# Patient Record
Sex: Male | Born: 1980 | Race: White | Hispanic: No | Marital: Married | Smoking: Current every day smoker
Health system: Southern US, Community
[De-identification: ages and names within clinical notes are randomized; demographics above are authoritative.]

## PROBLEM LIST (undated history)

## (undated) DIAGNOSIS — F4310 Post-traumatic stress disorder, unspecified: Secondary | ICD-10-CM

## (undated) DIAGNOSIS — K859 Acute pancreatitis without necrosis or infection, unspecified: Secondary | ICD-10-CM

## (undated) HISTORY — PX: OTHER SURGICAL HISTORY: SHX169

## (undated) HISTORY — PX: APPENDECTOMY: SHX54

## (undated) HISTORY — PX: KNEE ARTHROSCOPY: SUR90

## (undated) HISTORY — PX: ABDOMINAL SURGERY: SHX537

---

## 2010-03-17 ENCOUNTER — Emergency Department (HOSPITAL_COMMUNITY): Admission: EM | Admit: 2010-03-17 | Discharge: 2010-03-17 | Payer: Self-pay | Admitting: Emergency Medicine

## 2017-08-11 ENCOUNTER — Other Ambulatory Visit: Payer: Self-pay

## 2017-08-11 ENCOUNTER — Encounter (HOSPITAL_COMMUNITY): Payer: Self-pay

## 2017-08-11 DIAGNOSIS — K0889 Other specified disorders of teeth and supporting structures: Secondary | ICD-10-CM | POA: Diagnosis present

## 2017-08-11 NOTE — ED Triage Notes (Signed)
Pt reports dental pain x 2 days. He has a dentist appointment next week, but reports that the pain is too bad. A&Ox4. Denies fever, bleeding, or trouble swallowing.

## 2017-08-12 ENCOUNTER — Emergency Department (HOSPITAL_COMMUNITY)
Admission: EM | Admit: 2017-08-12 | Discharge: 2017-08-12 | Disposition: A | Discharge status: Home / Self Care | Payer: BLUE CROSS/BLUE SHIELD | Attending: Emergency Medicine | Admitting: Emergency Medicine

## 2017-08-12 DIAGNOSIS — K0889 Other specified disorders of teeth and supporting structures: Secondary | ICD-10-CM

## 2017-08-12 MED ORDER — HYDROCODONE-ACETAMINOPHEN 5-325 MG PO TABS: ORAL_TABLET | ORAL | 0 refills | Status: DC

## 2017-08-12 MED ORDER — OXYCODONE-ACETAMINOPHEN 5-325 MG PO TABS
1.0000 | ORAL_TABLET | ORAL | Status: DC | PRN
  Administered 2017-08-12: 1 via ORAL
  Filled 2017-08-12: qty 1

## 2017-08-12 MED ORDER — HYDROCODONE-ACETAMINOPHEN 5-325 MG PO TABS
1.0000 | ORAL_TABLET | Freq: Once | ORAL | Status: AC
  Administered 2017-08-12: 1 via ORAL
  Filled 2017-08-12: qty 1

## 2017-08-12 MED ORDER — AMOXICILLIN 500 MG PO CAPS: 1000.0000 mg | ORAL_CAPSULE | Freq: Two times a day (BID) | ORAL | 0 refills | Status: DC

## 2017-08-12 MED ORDER — AMOXICILLIN 500 MG PO CAPS
1000.0000 mg | ORAL_CAPSULE | Freq: Once | ORAL | Status: AC
  Administered 2017-08-12: 1000 mg via ORAL
  Filled 2017-08-12: qty 2

## 2017-08-12 NOTE — ED Provider Notes (Signed)
[facility]-EMERGENCY DEPT Provider Note   CSN: [id number] Arrival date & time: 08/11/17  2148     History   Chief Complaint Chief Complaint  Patient presents with  . Dental Pain    HPI   Blood pressure (!) 151/90, pulse 75, temperature 98.5 F (36.9 C), temperature source Oral, resp. rate 16, SpO2 97 %.  Jeffrey Mathis is a 37 y.o. male complaining of severe right lower dental pain onset 2 days ago not alleviated with Orajel or ibuprofen.  He has an appointment with dentist next week.Denies fever/chills, difficulty opening jaw, difficulty swallowing, SOB, gum swelling, facial swelling, neck swelling.    History reviewed. No pertinent past medical history.  There are no active problems to display for this patient.    Home Medications    Prior to Admission medications   Medication Sig Start Date End Date Taking? Authorizing Provider  amoxicillin (AMOXIL) 500 MG capsule Take 2 capsules (1,000 mg total) by mouth 2 (two) times daily. 08/12/17   Velera Lansdale, Joni Reining, PA-C  HYDROcodone-acetaminophen (NORCO/VICODIN) 5-325 MG tablet Take 1-2 tablets by mouth every 6 hours as needed for pain. 08/12/17   Milca Sytsma, Mardella Layman    Family History History reviewed. No pertinent family history.  Social History Social History   Tobacco Use  . Smoking status: Not on file  Substance Use Topics  . Alcohol use: Not on file  . Drug use: Not on file     Allergies   Patient has no known allergies.   Review of Systems Review of Systems  A complete review of systems was obtained and all systems are negative except as noted in the HPI and PMH.    Physical Exam Updated Vital Signs BP (!) 151/90 (BP Location: Left Arm)   Pulse 75   Temp 98.5 F (36.9 C) (Oral)   Resp 16   SpO2 97%   Physical Exam  Constitutional: He is oriented to person, place, and time. He appears well-developed and well-nourished. No distress.  HENT:  Head: Normocephalic.    Mouth/Throat: Oropharynx is clear and moist.  Generally poor dentition, no gingival swelling, erythema or tenderness to palpation. Patient is handling their secretions. There is no tenderness to palpation or firmness underneath tongue bilaterally. No trismus.    Eyes: Conjunctivae and EOM are normal. Pupils are equal, round, and reactive to light.  Neck: Normal range of motion.  Cardiovascular: Normal rate.  Pulmonary/Chest: Effort normal. No stridor.  Abdominal: Soft.  Musculoskeletal: Normal range of motion.  Lymphadenopathy:    He has no cervical adenopathy.  Neurological: He is alert and oriented to person, place, and time.  Psychiatric: He has a normal mood and affect.  Nursing note and vitals reviewed.    ED Treatments / Results  Labs (all labs ordered are listed, but only abnormal results are displayed) Labs Reviewed - No data to display  EKG  EKG Interpretation None       Radiology No results found.  Procedures Procedures (including critical care time)  Medications Ordered in ED Medications  oxyCODONE-acetaminophen (PERCOCET/ROXICET) 5-325 MG per tablet 1 tablet (1 tablet Oral Given 08/12/17 0106)  amoxicillin (AMOXIL) capsule 1,000 mg (not administered)  HYDROcodone-acetaminophen (NORCO/VICODIN) 5-325 MG per tablet 1 tablet (not administered)     Initial Impression / Assessment and Plan / ED Course  I have reviewed the triage vital signs and the nursing notes.  Pertinent labs & imaging results that were available during my care of the patient were reviewed by  me and considered in my medical decision making (see chart for details).     Vitals:   08/11/17 2321  BP: (!) 151/90  Pulse: 75  Resp: 16  Temp: 98.5 F (36.9 C)  TempSrc: Oral  SpO2: 97%    Medications  oxyCODONE-acetaminophen (PERCOCET/ROXICET) 5-325 MG per tablet 1 tablet (1 tablet Oral Given 08/12/17 0106)  amoxicillin (AMOXIL) capsule 1,000 mg (not administered)   HYDROcodone-acetaminophen (NORCO/VICODIN) 5-325 MG per tablet 1 tablet (not administered)    Charlene Brookeatrick Difatta is 37 y.o. male presenting with dental pain associated with dental caries but no signs or symptoms of dental abscess. Patient afebrile, non toxic appearing and swallowing secretions well. I gave patient referral to dentist and stressed the importance of dental follow up for definitive management of dental issues. Patient voices understanding and is agreeable to plan.  Evaluation does not show pathology that would require ongoing emergent intervention or inpatient treatment. Pt is hemodynamically stable and mentating appropriately. Discussed findings and plan with patient/guardian, who agrees with care plan. All questions answered. Return precautions discussed and outpatient follow up given.      Final Clinical Impressions(s) / ED Diagnoses   Final diagnoses:  Pain, dental    ED Discharge Orders        Ordered    amoxicillin (AMOXIL) 500 MG capsule  2 times daily     08/12/17 0129    HYDROcodone-acetaminophen (NORCO/VICODIN) 5-325 MG tablet     08/12/17 0129       Tonetta Napoles, Mardella Laymanicole, PA-C 08/12/17 0131    Molpus, Jonny RuizJohn, MD 08/12/17 [phone]

## 2017-08-12 NOTE — Discharge Instructions (Signed)
Take vicodin for breakthrough pain, do not drink alcohol, drive, care for children or do other critical tasks while taking vicodin. °  °Apply warm compresses to jaw throughout the day.  ° °Take your antibiotics as directed and to the end of the course.  ° °Followup with a dentist is very important for dental pain. Return to emergency department for emergent changing or worsening symptoms including  fever, change in vision, redness to the face that rapidly spreads towards the eye, nausea or vomiting, difficulty swallowing or shortness of breath. ° °

## 2017-12-11 ENCOUNTER — Inpatient Hospital Stay (HOSPITAL_COMMUNITY)
Admission: EM | Admit: 2017-12-11 | Discharge: 2017-12-14 | DRG: 440 | Disposition: A | Discharge status: Home / Self Care | Payer: Non-veteran care | Attending: Internal Medicine | Admitting: Internal Medicine

## 2017-12-11 ENCOUNTER — Emergency Department (HOSPITAL_COMMUNITY): Payer: Non-veteran care

## 2017-12-11 ENCOUNTER — Other Ambulatory Visit: Payer: Self-pay

## 2017-12-11 ENCOUNTER — Encounter (HOSPITAL_COMMUNITY): Payer: Self-pay

## 2017-12-11 DIAGNOSIS — K8689 Other specified diseases of pancreas: Secondary | ICD-10-CM | POA: Diagnosis present

## 2017-12-11 DIAGNOSIS — D72829 Elevated white blood cell count, unspecified: Secondary | ICD-10-CM | POA: Diagnosis not present

## 2017-12-11 DIAGNOSIS — D72828 Other elevated white blood cell count: Secondary | ICD-10-CM | POA: Diagnosis present

## 2017-12-11 DIAGNOSIS — Z79890 Hormone replacement therapy (postmenopausal): Secondary | ICD-10-CM

## 2017-12-11 DIAGNOSIS — R740 Nonspecific elevation of levels of transaminase and lactic acid dehydrogenase [LDH]: Secondary | ICD-10-CM | POA: Diagnosis not present

## 2017-12-11 DIAGNOSIS — K219 Gastro-esophageal reflux disease without esophagitis: Secondary | ICD-10-CM | POA: Diagnosis present

## 2017-12-11 DIAGNOSIS — Z9049 Acquired absence of other specified parts of digestive tract: Secondary | ICD-10-CM

## 2017-12-11 DIAGNOSIS — F4310 Post-traumatic stress disorder, unspecified: Secondary | ICD-10-CM | POA: Diagnosis present

## 2017-12-11 DIAGNOSIS — F17210 Nicotine dependence, cigarettes, uncomplicated: Secondary | ICD-10-CM | POA: Diagnosis present

## 2017-12-11 DIAGNOSIS — R7401 Elevation of levels of liver transaminase levels: Secondary | ICD-10-CM

## 2017-12-11 DIAGNOSIS — K859 Acute pancreatitis without necrosis or infection, unspecified: Secondary | ICD-10-CM | POA: Diagnosis not present

## 2017-12-11 DIAGNOSIS — K760 Fatty (change of) liver, not elsewhere classified: Secondary | ICD-10-CM | POA: Diagnosis present

## 2017-12-11 DIAGNOSIS — R1013 Epigastric pain: Secondary | ICD-10-CM | POA: Diagnosis present

## 2017-12-11 HISTORY — DX: Post-traumatic stress disorder, unspecified: F43.10

## 2017-12-11 LAB — URINALYSIS, ROUTINE W REFLEX MICROSCOPIC
Glucose, UA: NEGATIVE mg/dL
HGB URINE DIPSTICK: NEGATIVE
KETONES UR: 80 mg/dL — AB
LEUKOCYTES UA: NEGATIVE
Nitrite: NEGATIVE
PH: 5 (ref 5.0–8.0)
Protein, ur: 100 mg/dL — AB
Specific Gravity, Urine: 1.028 (ref 1.005–1.030)

## 2017-12-11 LAB — CBC
HCT: 48.2 % (ref 39.0–52.0)
HEMOGLOBIN: 17 g/dL (ref 13.0–17.0)
MCH: 34.1 pg — AB (ref 26.0–34.0)
MCHC: 35.3 g/dL (ref 30.0–36.0)
MCV: 96.8 fL (ref 78.0–100.0)
Platelets: 242 10*3/uL (ref 150–400)
RBC: 4.98 MIL/uL (ref 4.22–5.81)
RDW: 12.7 % (ref 11.5–15.5)
WBC: 18.2 10*3/uL — ABNORMAL HIGH (ref 4.0–10.5)

## 2017-12-11 LAB — LACTATE DEHYDROGENASE: LDH: 141 U/L (ref 98–192)

## 2017-12-11 LAB — COMPREHENSIVE METABOLIC PANEL
ALBUMIN: 4.4 g/dL (ref 3.5–5.0)
ALT: 84 U/L — ABNORMAL HIGH (ref 0–44)
ANION GAP: 17 — AB (ref 5–15)
AST: 68 U/L — ABNORMAL HIGH (ref 15–41)
Alkaline Phosphatase: 97 U/L (ref 38–126)
BUN: 12 mg/dL (ref 6–20)
CHLORIDE: 100 mmol/L (ref 98–111)
CO2: 22 mmol/L (ref 22–32)
Calcium: 9.8 mg/dL (ref 8.9–10.3)
Creatinine, Ser: 0.89 mg/dL (ref 0.61–1.24)
GFR calc Af Amer: >60 mL/min (ref >60)
GFR calc non Af Amer: >60 mL/min (ref >60)
Glucose, Bld: 92 mg/dL (ref 70–99)
Potassium: 3.8 mmol/L (ref 3.5–5.1)
Sodium: 139 mmol/L (ref 135–145)
Total Bilirubin: 2.4 mg/dL — ABNORMAL HIGH (ref 0.3–1.2)
Total Protein: 7.8 g/dL (ref 6.5–8.1)

## 2017-12-11 LAB — LIPASE, BLOOD: LIPASE: 1503 U/L — AB (ref 11–51)

## 2017-12-11 MED ORDER — SODIUM CHLORIDE 0.9 % IV SOLN
INTRAVENOUS | Status: DC
Start: 2017-12-11 — End: 2017-12-12
  Administered 2017-12-11 – 2017-12-12 (×2): via INTRAVENOUS

## 2017-12-11 MED ORDER — IOPAMIDOL (ISOVUE-300) INJECTION 61%
INTRAVENOUS | Status: AC
  Filled 2017-12-11: qty 100

## 2017-12-11 MED ORDER — MELATONIN 5 MG PO TABS
5.0000 mg | ORAL_TABLET | Freq: Every day | ORAL | Status: DC
  Administered 2017-12-11 – 2017-12-13 (×3): 5 mg via ORAL
  Filled 2017-12-11 (×4): qty 1

## 2017-12-11 MED ORDER — IOPAMIDOL (ISOVUE-300) INJECTION 61%
100.0000 mL | Freq: Once | INTRAVENOUS | Status: AC | PRN
  Administered 2017-12-11: 100 mL via INTRAVENOUS

## 2017-12-11 MED ORDER — HYDROMORPHONE HCL 1 MG/ML IJ SOLN
1.0000 mg | INTRAMUSCULAR | Status: DC | PRN
  Administered 2017-12-11 – 2017-12-13 (×7): 1 mg via INTRAVENOUS
  Filled 2017-12-11 (×7): qty 1

## 2017-12-11 MED ORDER — ONDANSETRON HCL 4 MG/2ML IJ SOLN
4.0000 mg | Freq: Four times a day (QID) | INTRAMUSCULAR | Status: DC | PRN
Start: 2017-12-11 — End: 2017-12-14
  Administered 2017-12-12: 4 mg via INTRAVENOUS
  Filled 2017-12-11: qty 2

## 2017-12-11 MED ORDER — SODIUM CHLORIDE 0.9 % IV BOLUS
1000.0000 mL | Freq: Once | INTRAVENOUS | Status: AC
  Administered 2017-12-11: 1000 mL via INTRAVENOUS

## 2017-12-11 MED ORDER — PANTOPRAZOLE SODIUM 40 MG PO TBEC
40.0000 mg | DELAYED_RELEASE_TABLET | Freq: Every day | ORAL | Status: DC
  Administered 2017-12-11 – 2017-12-14 (×4): 40 mg via ORAL
  Filled 2017-12-11 (×4): qty 1

## 2017-12-11 MED ORDER — MORPHINE SULFATE (PF) 4 MG/ML IV SOLN
4.0000 mg | Freq: Once | INTRAVENOUS | Status: AC
  Administered 2017-12-11: 4 mg via INTRAVENOUS
  Filled 2017-12-11: qty 1

## 2017-12-11 MED ORDER — ONDANSETRON HCL 4 MG/2ML IJ SOLN
4.0000 mg | Freq: Once | INTRAMUSCULAR | Status: AC
  Administered 2017-12-11: 4 mg via INTRAVENOUS
  Filled 2017-12-11: qty 2

## 2017-12-11 MED ORDER — DIPHENHYDRAMINE HCL 50 MG/ML IJ SOLN
25.0000 mg | Freq: Four times a day (QID) | INTRAMUSCULAR | Status: DC | PRN
Start: 2017-12-11 — End: 2017-12-14

## 2017-12-11 MED ORDER — ENOXAPARIN SODIUM 40 MG/0.4ML ~~LOC~~ SOLN
40.0000 mg | SUBCUTANEOUS | Status: DC
  Administered 2017-12-11 – 2017-12-13 (×3): 40 mg via SUBCUTANEOUS
  Filled 2017-12-11 (×3): qty 0.4

## 2017-12-11 NOTE — Progress Notes (Signed)
ED TO INPATIENT HANDOFF REPORT  Name/Age/Gender Jeffrey Mathis 37 y.o. male  Code Status    Code Status Orders  (From admission, onward)        Start     Ordered   12/11/17 2022  Full code  Continuous     12/11/17 2025    Code Status History    This patient has a current code status but no historical code status.      Home/SNF/Other Home  Chief Complaint Abnormal labs (elevated wbc), sent by [facility]  Level of Care/Admitting Diagnosis ED Disposition    ED Disposition Condition Comment   Admit  Hospital Area: [facility] [[id number]]  Level of Care: Telemetry [5]  Admit to tele based on following criteria: Monitor for Ischemic changes  Diagnosis: Pancreatitis [185909]  Admitting Physician: Jani Gravel [[id number]]  Attending Physician: Jani Gravel [id number]  Estimated length of stay: past midnight tomorrow  Certification:: I certify this patient will need inpatient services for at least 2 midnights  PT Class (Do Not Modify): Inpatient [101]  PT Acc Code (Do Not Modify): Private [1]       Medical History Past Medical History:  Diagnosis Date  . PTSD (post-traumatic stress disorder)     Allergies No Known Allergies  IV Location/Drains/Wounds Patient Lines/Drains/Airways Status   Active Line/Drains/Airways    Name:   Placement date:   Placement time:   Site:   Days:   Peripheral IV 12/11/17 Right Antecubital   12/11/17    1754    Antecubital   less than 1          Labs/Imaging Results for orders placed or performed during the hospital encounter of 12/11/17 (from the past 48 hour(s))  Lipase, blood     Status: Abnormal   Collection Time: 12/11/17  5:53 PM  Result Value Ref Range   Lipase 1,503 (H) 11 - 51 U/L    Comment: RESULTS CONFIRMED BY MANUAL DILUTION Performed at [facility] [address], [place], [place] [address]   Comprehensive metabolic panel     Status: Abnormal   Collection Time: 12/11/17  5:53 PM  Result Value  Ref Range   Sodium 139 135 - 145 mmol/L   Potassium 3.8 3.5 - 5.1 mmol/L   Chloride 100 98 - 111 mmol/L    Comment: Please note change in reference range.   CO2 22 22 - 32 mmol/L   Glucose, Bld 92 70 - 99 mg/dL    Comment: Please note change in reference range.   BUN 12 6 - 20 mg/dL    Comment: Please note change in reference range.   Creatinine, Ser 0.89 0.61 - 1.24 mg/dL   Calcium 9.8 8.9 - 10.3 mg/dL   Total Protein 7.8 6.5 - 8.1 g/dL   Albumin 4.4 3.5 - 5.0 g/dL   AST 68 (H) 15 - 41 U/L   ALT 84 (H) 0 - 44 U/L    Comment: Please note change in reference range.   Alkaline Phosphatase 97 38 - 126 U/L   Total Bilirubin 2.4 (H) 0.3 - 1.2 mg/dL   GFR calc non Af Amer >60 >60 mL/min   GFR calc Af Amer >60 >60 mL/min    Comment: (NOTE) The eGFR has been calculated using the CKD EPI equation. This calculation has not been validated in all clinical situations. eGFR's persistently <60 mL/min signify possible Chronic Kidney Disease.    Anion gap 17 (H) 5 - 15  Comment: Performed at [facility], [place] [address], [place], [place] [address]  CBC     Status: Abnormal   Collection Time: 12/11/17  5:53 PM  Result Value Ref Range   WBC 18.2 (H) 4.0 - 10.5 K/uL   RBC 4.98 4.22 - 5.81 MIL/uL   Hemoglobin 17.0 13.0 - 17.0 g/dL   HCT 48.2 39.0 - 52.0 %   MCV 96.8 78.0 - 100.0 fL   MCH 34.1 (H) 26.0 - 34.0 pg   MCHC 35.3 30.0 - 36.0 g/dL   RDW 12.7 11.5 - 15.5 %   Platelets 242 150 - 400 K/uL    Comment: Performed at [facility], [place] [address] [place], [place] [address]  Urinalysis, Routine w reflex microscopic     Status: Abnormal   Collection Time: 12/11/17  5:55 PM  Result Value Ref Range   Color, Urine AMBER (A) YELLOW    Comment: BIOCHEMICALS MAY BE AFFECTED BY COLOR   APPearance CLEAR CLEAR   Specific Gravity, Urine 1.028 1.005 - 1.030   pH 5.0 5.0 - 8.0   Glucose, UA NEGATIVE NEGATIVE mg/dL   Hgb urine dipstick NEGATIVE NEGATIVE    Bilirubin Urine SMALL (A) NEGATIVE   Ketones, ur 80 (A) NEGATIVE mg/dL   Protein, ur 100 (A) NEGATIVE mg/dL   Nitrite NEGATIVE NEGATIVE   Leukocytes, UA NEGATIVE NEGATIVE   RBC / HPF 0-5 0 - 5 RBC/hpf   WBC, UA 0-5 0 - 5 WBC/hpf   Bacteria, UA FEW (A) NONE SEEN   Squamous Epithelial / LPF 0-5 0 - 5   Mucus PRESENT    Hyaline Casts, UA PRESENT     Comment: Performed at [facility], [place] [address] [place], [place] [address]   Ct Abdomen Pelvis W Contrast  Result Date: 12/11/2017 CLINICAL DATA:  Elevated white count.  Upper abdominal pain EXAM: CT ABDOMEN AND PELVIS WITH CONTRAST TECHNIQUE: Multidetector CT imaging of the abdomen and pelvis was performed using the standard protocol following bolus administration of intravenous contrast. CONTRAST:  139m ISOVUE-300 IOPAMIDOL (ISOVUE-300) INJECTION 61% COMPARISON:  None. FINDINGS: Lower chest:  No contributory findings. Hepatobiliary: Hepatic steatosis.No evidence of biliary obstruction or stone. Pancreas: Peripancreatic edema and generalized pancreatic expansion. No necrosis or collection. No neighboring vascular complication. Spleen: Unremarkable. Adrenals/Urinary Tract: Negative adrenals. No hydronephrosis or stone. Unremarkable bladder. Stomach/Bowel: Nonspecific prominent submucosal fat deposition within the colon. No evidence of acute inflammation. History of appendectomy. Vascular/Lymphatic: No acute vascular abnormality. No mass or adenopathy. Reproductive:Negative Other: Small volume ascites considered reactive Musculoskeletal: No acute abnormalities. IMPRESSION: 1. Acute pancreatitis without necrosis or collection. 2. Marked hepatic steatosis. Electronically Signed   By: JMonte FantasiaM.D.   On: 12/11/2017 19:54    Pending Labs Unresulted Labs (From admission, onward)   Start     Ordered   12/18/17 0500  Creatinine, serum  (enoxaparin (LOVENOX)    CrCl >/= 30 ml/min)  Weekly,   R    Comments:  while on enoxaparin  therapy    12/11/17 2025   12/12/17 0500  Comprehensive metabolic panel  Tomorrow morning,   R     12/11/17 2025   12/12/17 0500  CBC  Tomorrow morning,   R     12/11/17 2025   12/12/17 0500  Lipid panel  Tomorrow morning,   R     12/11/17 2025   12/12/17 0500  Hepatitis panel, acute  Tomorrow morning,   R  12/11/17 2025   12/11/17 2023  Lactate dehydrogenase  Add-on,   R     12/11/17 2025   12/11/17 2021  HIV antibody (Routine Testing)  Once,   R     12/11/17 2025      Vitals/Pain Today's Vitals   12/11/17 1842 12/11/17 1900 12/11/17 2049 12/11/17 2108  BP:  132/78 139/85   Pulse:  73 82   Resp:  18 18   Temp:      TempSrc:      SpO2:  97% 98%   Weight:      Height:      PainSc: 2    8     Isolation Precautions No active isolations  Medications Medications  iopamidol (ISOVUE-300) 61 % injection (has no administration in time range)  sodium chloride 0.9 % bolus 1,000 mL (1,000 mLs Intravenous New Bag/Given 12/11/17 2105)  enoxaparin (LOVENOX) injection 40 mg (has no administration in time range)  0.9 %  sodium chloride infusion (has no administration in time range)  HYDROmorphone (DILAUDID) injection 1 mg (has no administration in time range)  ondansetron (ZOFRAN) injection 4 mg (has no administration in time range)  diphenhydrAMINE (BENADRYL) injection 25 mg (has no administration in time range)  pantoprazole (PROTONIX) EC tablet 40 mg (has no administration in time range)  sodium chloride 0.9 % bolus 1,000 mL (0 mLs Intravenous Stopped 12/11/17 1912)  ondansetron (ZOFRAN) injection 4 mg (4 mg Intravenous Given 12/11/17 1807)  morphine 4 MG/ML injection 4 mg (4 mg Intravenous Given 12/11/17 1807)  iopamidol (ISOVUE-300) 61 % injection 100 mL (100 mLs Intravenous Contrast Given 12/11/17 1920)  morphine 4 MG/ML injection 4 mg (4 mg Intravenous Given 12/11/17 2105)    Mobility walks

## 2017-12-11 NOTE — ED Triage Notes (Signed)
[facility] today and was told he had an elevated WBC. Patient c/o upper and mid abdominal pain and vomiting x 3 days ago.

## 2017-12-11 NOTE — H&P (Signed)
TRH H&P   Patient Demographics:    Jeffrey Mathis, is a 37 y.o. male  MRN: [id number]   DOB - December 13, 1980  Admit Date - 12/11/2017  Outpatient Primary MD for the patient is [facility]  Referring MD/NP/PA:  Isla Pence  Outpatient Specialists:    Patient coming from:   home  Chief Complaint  Patient presents with  . Abnormal Lab    WBC-16.0  . Emesis  . Abdominal Pain      HPI:    Jeffrey Mathis  is a 37 y.o. male, w PTSD has c/o epigastric pain as well as n/v x 3 days.  Denies fever, chills, wt loss, diarrhea, brbpr, black stool.  Pt went to [facility] and was told to go to ER. Pt admits to taking ibuprofen OTC 3 per day.   In Ed,  IMPRESSION: 1. Acute pancreatitis without necrosis or collection. 2. Marked hepatic steatosis.  Lipase 1,503 Na 139, K 3.8 Bun 12, creatinine 0.89 Ast 68, Alt 84 Alk phos 97, T. Bili 2.4  Wbc 18.2, Hgb 17.0, Plt 242  urinalysis negative  Pt will be admitted for pancreatitis of unknown cause.       Review of systems:    In addition to the HPI above, No Fever-chills, No Headache, No changes with Vision or hearing, No problems swallowing food or Liquids, No Chest pain, Cough or Shortness of Breath,  No Blood in stool or Urine, No dysuria, No new skin rashes or bruises, No new joints pains-aches,  No new weakness, tingling, numbness in any extremity, No recent weight gain or loss, No polyuria, polydypsia or polyphagia, No significant Mental Stressors.  A full 10 point Review of Systems was done, except as stated above, all other Review of Systems were negative.   With Past History of the following :    Past Medical History:  Diagnosis Date  . PTSD (post-traumatic stress disorder)       Past Surgical History:  Procedure Laterality Date  . ABDOMINAL SURGERY     scrapnel  . APPENDECTOMY    . arm  surgery    . KNEE ARTHROSCOPY        Social History:     Social History   Tobacco Use  . Smoking status: Current Every Day Smoker    Packs/day: 0.50    Types: Cigarettes  . Smokeless tobacco: Never Used  Substance Use Topics  . Alcohol use: Not Currently     Lives - at home  Mobility - walks by self   Family History :     Family History  Problem Relation Age of Onset  . Cancer Mother   . Bipolar disorder Mother   . Cancer Father   . Pancreatitis Father        Home Medications:   Prior to Admission medications   Medication Sig Start Date End Date Taking?  Authorizing Provider  ibuprofen (ADVIL,MOTRIN) 200 MG tablet Take 600 mg by mouth daily as needed for moderate pain.   Yes [provider]  Melatonin 5 MG TABS Take 5 mg by mouth at bedtime.   Yes [provider]  amoxicillin (AMOXIL) 500 MG capsule Take 2 capsules (1,000 mg total) by mouth 2 (two) times daily. Patient not taking: Reported on 12/11/2017 08/12/17   Pisciotta, Elmyra Ricks, PA-C  HYDROcodone-acetaminophen (NORCO/VICODIN) 5-325 MG tablet Take 1-2 tablets by mouth every 6 hours as needed for pain. Patient not taking: Reported on 12/11/2017 08/12/17   Pisciotta, Elmyra Ricks, PA-C     Allergies:    No Known Allergies   Physical Exam:   Vitals  Blood pressure 132/78, pulse 73, temperature (!) 97.5 F (36.4 C), temperature source Oral, resp. rate 18, height _0  (1.854 m), weight 77.1 kg (170 lb), SpO2 97 %.   1. General lying in bed in NAD,   2. Normal affect and insight, Not Suicidal or Homicidal, Awake Alert, Oriented X 3.  3. No F.N deficits, ALL C.Nerves Intact, Strength 5/5 all 4 extremities, Sensation intact all 4 extremities, Plantars down going.  4. Ears and Eyes appear Normal, Conjunctivae clear, PERRLA. Moist Oral Mucosa.  5. Supple Neck, No JVD, No cervical lymphadenopathy appriciated, No Carotid Bruits.  6. Symmetrical Chest wall movement, Good air movement bilaterally,  CTAB.  7. RRR, No Gallops, Rubs or Murmurs, No Parasternal Heave.  8. Positive Bowel Sounds, Abdomen Soft, No tenderness, No organomegaly appriciated,No rebound -guarding or rigidity.  9.  No Cyanosis, Normal Skin Turgor, No Skin Rash or Bruise.  10. Good muscle tone,  joints appear normal , no effusions, Normal ROM.  11. No Palpable Lymph Nodes in Neck or Axillae     Data Review:    CBC Recent Labs  Lab 12/11/17 1753  WBC 18.2*  HGB 17.0  HCT 48.2  PLT 242  MCV 96.8  MCH 34.1*  MCHC 35.3  RDW 12.7   ------------------------------------------------------------------------------------------------------------------  Chemistries  Recent Labs  Lab 12/11/17 1753  NA 139  K 3.8  CL 100  CO2 22  GLUCOSE 92  BUN 12  CREATININE 0.89  CALCIUM 9.8  AST 68*  ALT 84*  ALKPHOS 97  BILITOT 2.4*   ------------------------------------------------------------------------------------------------------------------ estimated creatinine clearance is 127.5 mL/min (by C-G formula based on SCr of 0.89 mg/dL). ------------------------------------------------------------------------------------------------------------------ No results for input(s): TSH, T4TOTAL, T3FREE, THYROIDAB in the last 72 hours.  Invalid input(s): FREET3  Coagulation profile No results for input(s): INR, PROTIME in the last 168 hours. ------------------------------------------------------------------------------------------------------------------- No results for input(s): DDIMER in the last 72 hours. -------------------------------------------------------------------------------------------------------------------  Cardiac Enzymes No results for input(s): CKMB, TROPONINI, MYOGLOBIN in the last 168 hours.  Invalid input(s): CK ------------------------------------------------------------------------------------------------------------------ No results found for:  BNP   ---------------------------------------------------------------------------------------------------------------  Urinalysis    Component Value Date/Time   COLORURINE AMBER (A) 12/11/2017 [place] 12/11/2017 1755   LABSPEC 1.028 12/11/2017 1755   PHURINE 5.0 12/11/2017 1755   GLUCOSEU NEGATIVE 12/11/2017 1755   HGBUR NEGATIVE 12/11/2017 1755   BILIRUBINUR SMALL (A) 12/11/2017 1755   KETONESUR 80 (A) 12/11/2017 1755   PROTEINUR 100 (A) 12/11/2017 1755   NITRITE NEGATIVE 12/11/2017 1755   LEUKOCYTESUR NEGATIVE 12/11/2017 1755    ----------------------------------------------------------------------------------------------------------------   Imaging Results:    Ct Abdomen Pelvis W Contrast  Result Date: 12/11/2017 CLINICAL DATA:  Elevated white count.  Upper abdominal pain EXAM: CT ABDOMEN AND PELVIS WITH CONTRAST TECHNIQUE: Multidetector CT imaging of the  abdomen and pelvis was performed using the standard protocol following bolus administration of intravenous contrast. CONTRAST:  163m ISOVUE-300 IOPAMIDOL (ISOVUE-300) INJECTION 61% COMPARISON:  None. FINDINGS: Lower chest:  No contributory findings. Hepatobiliary: Hepatic steatosis.No evidence of biliary obstruction or stone. Pancreas: Peripancreatic edema and generalized pancreatic expansion. No necrosis or collection. No neighboring vascular complication. Spleen: Unremarkable. Adrenals/Urinary Tract: Negative adrenals. No hydronephrosis or stone. Unremarkable bladder. Stomach/Bowel: Nonspecific prominent submucosal fat deposition within the colon. No evidence of acute inflammation. History of appendectomy. Vascular/Lymphatic: No acute vascular abnormality. No mass or adenopathy. Reproductive:Negative Other: Small volume ascites considered reactive Musculoskeletal: No acute abnormalities. IMPRESSION: 1. Acute pancreatitis without necrosis or collection. 2. Marked hepatic steatosis. Electronically Signed   By:  JMonte FantasiaM.D.   On: 12/11/2017 19:54       Assessment & Plan:    Active Problems:   Pancreatitis    Pancreatitis + family hx (father) Check LDH, check Lipid NPO Ns iv Dilaudid 197miv q3h prn pain  N/v zofran 72m5mv q6h prn   Abnormal liver function  Check acute hepatitis panel  Gerd protonix 24m24m qday    DVT Prophylaxis Lovenox - SCDs  AM Labs Ordered, also please review Full Orders  Family Communication: Admission, patients condition and plan of care including tests being ordered have been discussed with the patient who indicate understanding and agree with the plan and Code Status.  Code Status  FULL CODE  Likely DC to  home  Condition GUARDED    Consults called:   none  Admission status:  inpatient  Time spent in minutes : 60   JameJani Gravel on 12/11/2017 at 8:29 PM  Between 7am to 7pm - Pager - [phone] 7pm go to [email or web address] - password [email or web address] - Office  [phone]

## 2017-12-11 NOTE — ED Provider Notes (Signed)
[facility]-EMERGENCY DEPT Provider Note   CSN: [id number] Arrival date & time: 12/11/17  1655     History   Chief Complaint Chief Complaint  Patient presents with  . Abnormal Lab    WBC-16.0  . Emesis  . Abdominal Pain    HPI Jeffrey Mathis is a 37 y.o. male.  Pt presents to the ED today with abdominal pain, n/v, and elevated wbc.  The pt went to the [place] today and they did blood work.  The WBC came back elevated, so they told him to go to the ED for further eval.  The pt said his pain is in his epigastrium and radiates around to his back.  The pt has had 3 days of pain with n/v.  Pt has never had anything like this in the past.     Past Medical History:  Diagnosis Date  . PTSD (post-traumatic stress disorder)     There are no active problems to display for this patient.   Surghx:  Arm, appy      Home Medications    Prior to Admission medications   Medication Sig Start Date End Date Taking? Authorizing Provider  ibuprofen (ADVIL,MOTRIN) 200 MG tablet Take 600 mg by mouth daily as needed for moderate pain.   Yes [provider]  Melatonin 5 MG TABS Take 5 mg by mouth at bedtime.   Yes [provider]  amoxicillin (AMOXIL) 500 MG capsule Take 2 capsules (1,000 mg total) by mouth 2 (two) times daily. Patient not taking: Reported on 12/11/2017 08/12/17   Pisciotta, Joni Reining, PA-C  HYDROcodone-acetaminophen (NORCO/VICODIN) 5-325 MG tablet Take 1-2 tablets by mouth every 6 hours as needed for pain. Patient not taking: Reported on 12/11/2017 08/12/17   Pisciotta, Joni Reining, PA-C    Family History Family History  Problem Relation Age of Onset  . Cancer Mother   . Bipolar disorder Mother   . Cancer Father     Social History Social History   Tobacco Use  . Smoking status: Current Every Day Smoker    Packs/day: 0.50    Types: Cigarettes  . Smokeless tobacco: Never Used  Substance Use Topics  . Alcohol use: Not Currently  . Drug  use: Yes    Types: Marijuana    Comment: daily     Allergies   Patient has no known allergies.   Review of Systems Review of Systems  Gastrointestinal: Positive for abdominal pain, nausea and vomiting.  All other systems reviewed and are negative.    Physical Exam Updated Vital Signs BP 132/78   Pulse 73   Temp (!) 97.5 F (36.4 C) (Oral)   Resp 18   Ht 6\' 1"  (1.854 m)   Wt 77.1 kg (170 lb)   SpO2 97%   BMI 22.43 kg/m   Physical Exam  Constitutional: He appears well-developed and well-nourished.  HENT:  Head: Normocephalic and atraumatic.  Mouth/Throat: Mucous membranes are dry.  Eyes: Pupils are equal, round, and reactive to light. EOM are normal.  Cardiovascular: Normal rate, regular rhythm, normal heart sounds and intact distal pulses.  Pulmonary/Chest: Effort normal and breath sounds normal.  Abdominal: Normal appearance and bowel sounds are normal. There is tenderness in the right upper quadrant, epigastric area and left upper quadrant.  Neurological: He is alert.  Skin: Skin is warm. Capillary refill takes less than 2 seconds.  Psychiatric: He has a normal mood and affect.  Nursing note and vitals reviewed.    ED Treatments /  Results  Labs (all labs ordered are listed, but only abnormal results are displayed) Labs Reviewed  LIPASE, BLOOD - Abnormal; Notable for the following components:      Result Value   Lipase 1,503 (*)    All other components within normal limits  COMPREHENSIVE METABOLIC PANEL - Abnormal; Notable for the following components:   AST 68 (*)    ALT 84 (*)    Total Bilirubin 2.4 (*)    Anion gap 17 (*)    All other components within normal limits  CBC - Abnormal; Notable for the following components:   WBC 18.2 (*)    MCH 34.1 (*)    All other components within normal limits  URINALYSIS, ROUTINE W REFLEX MICROSCOPIC - Abnormal; Notable for the following components:   Color, Urine AMBER (*)    Bilirubin Urine SMALL (*)     Ketones, ur 80 (*)    Protein, ur 100 (*)    Bacteria, UA FEW (*)    All other components within normal limits    EKG None  Radiology Ct Abdomen Pelvis W Contrast  Result Date: 12/11/2017 CLINICAL DATA:  Elevated white count.  Upper abdominal pain EXAM: CT ABDOMEN AND PELVIS WITH CONTRAST TECHNIQUE: Multidetector CT imaging of the abdomen and pelvis was performed using the standard protocol following bolus administration of intravenous contrast. CONTRAST:  100mL ISOVUE-300 IOPAMIDOL (ISOVUE-300) INJECTION 61% COMPARISON:  None. FINDINGS: Lower chest:  No contributory findings. Hepatobiliary: Hepatic steatosis.No evidence of biliary obstruction or stone. Pancreas: Peripancreatic edema and generalized pancreatic expansion. No necrosis or collection. No neighboring vascular complication. Spleen: Unremarkable. Adrenals/Urinary Tract: Negative adrenals. No hydronephrosis or stone. Unremarkable bladder. Stomach/Bowel: Nonspecific prominent submucosal fat deposition within the colon. No evidence of acute inflammation. History of appendectomy. Vascular/Lymphatic: No acute vascular abnormality. No mass or adenopathy. Reproductive:Negative Other: Small volume ascites considered reactive Musculoskeletal: No acute abnormalities. IMPRESSION: 1. Acute pancreatitis without necrosis or collection. 2. Marked hepatic steatosis. Electronically Signed   By: Marnee SpringJonathon  Watts M.D.   On: 12/11/2017 19:54    Procedures Procedures (including critical care time)  Medications Ordered in ED Medications  iopamidol (ISOVUE-300) 61 % injection (has no administration in time range)  sodium chloride 0.9 % bolus 1,000 mL (has no administration in time range)  morphine 4 MG/ML injection 4 mg (has no administration in time range)  sodium chloride 0.9 % bolus 1,000 mL (0 mLs Intravenous Stopped 12/11/17 1912)  ondansetron (ZOFRAN) injection 4 mg (4 mg Intravenous Given 12/11/17 1807)  morphine 4 MG/ML injection 4 mg (4 mg  Intravenous Given 12/11/17 1807)  iopamidol (ISOVUE-300) 61 % injection 100 mL (100 mLs Intravenous Contrast Given 12/11/17 1920)     Initial Impression / Assessment and Plan / ED Course  I have reviewed the triage vital signs and the nursing notes.  Pertinent labs & imaging results that were available during my care of the patient were reviewed by me and considered in my medical decision making (see chart for details).     Pt is feeling better, but is still in pain.  More fluids and pain meds ordered.  No hx of pancreatitis in the past.  Pt d/w Dr. Selena BattenKim ([facility]) for admission.  Final Clinical Impressions(s) / ED Diagnoses   Final diagnoses:  Acute pancreatitis, unspecified complication status, unspecified pancreatitis type    ED Discharge Orders    None       Jacalyn LefevreHaviland, Kloi Brodman, MD 12/11/17 2022

## 2017-12-12 DIAGNOSIS — K859 Acute pancreatitis without necrosis or infection, unspecified: Principal | ICD-10-CM | POA: Diagnosis present

## 2017-12-12 DIAGNOSIS — R7401 Elevation of levels of liver transaminase levels: Secondary | ICD-10-CM

## 2017-12-12 DIAGNOSIS — D72829 Elevated white blood cell count, unspecified: Secondary | ICD-10-CM | POA: Diagnosis present

## 2017-12-12 DIAGNOSIS — R740 Nonspecific elevation of levels of transaminase and lactic acid dehydrogenase [LDH]: Secondary | ICD-10-CM

## 2017-12-12 DIAGNOSIS — K760 Fatty (change of) liver, not elsewhere classified: Secondary | ICD-10-CM | POA: Diagnosis present

## 2017-12-12 LAB — COMPREHENSIVE METABOLIC PANEL
ALT: 57 U/L — AB (ref 0–44)
ANION GAP: 12 (ref 5–15)
AST: 38 U/L (ref 15–41)
Albumin: 3.5 g/dL (ref 3.5–5.0)
Alkaline Phosphatase: 73 U/L (ref 38–126)
BUN: 8 mg/dL (ref 6–20)
CHLORIDE: 105 mmol/L (ref 98–111)
CO2: 25 mmol/L (ref 22–32)
CREATININE: 0.77 mg/dL (ref 0.61–1.24)
Calcium: 8.8 mg/dL — ABNORMAL LOW (ref 8.9–10.3)
Glucose, Bld: 86 mg/dL (ref 70–99)
Potassium: 4 mmol/L (ref 3.5–5.1)
Sodium: 142 mmol/L (ref 135–145)
Total Bilirubin: 1.8 mg/dL — ABNORMAL HIGH (ref 0.3–1.2)
Total Protein: 6.3 g/dL — ABNORMAL LOW (ref 6.5–8.1)

## 2017-12-12 LAB — LIPID PANEL
CHOL/HDL RATIO: 3.9 ratio
Cholesterol: 165 mg/dL (ref 0–200)
HDL: 42 mg/dL (ref >40)
LDL Cholesterol: 102 mg/dL — ABNORMAL HIGH (ref 0–99)
Triglycerides: 106 mg/dL (ref <150)
VLDL: 21 mg/dL (ref 0–40)

## 2017-12-12 LAB — CBC
HCT: 41.9 % (ref 39.0–52.0)
HEMOGLOBIN: 14.4 g/dL (ref 13.0–17.0)
MCH: 34 pg (ref 26.0–34.0)
MCHC: 34.4 g/dL (ref 30.0–36.0)
MCV: 99.1 fL (ref 78.0–100.0)
PLATELETS: 212 10*3/uL (ref 150–400)
RBC: 4.23 MIL/uL (ref 4.22–5.81)
RDW: 13 % (ref 11.5–15.5)
WBC: 14.5 10*3/uL — ABNORMAL HIGH (ref 4.0–10.5)

## 2017-12-12 LAB — HIV ANTIBODY (ROUTINE TESTING W REFLEX): HIV SCREEN 4TH GENERATION: NONREACTIVE

## 2017-12-12 LAB — ETHANOL: Alcohol, Ethyl (B): <10 mg/dL (ref <10)

## 2017-12-12 LAB — LIPASE, BLOOD: Lipase: 839 U/L — ABNORMAL HIGH (ref 11–51)

## 2017-12-12 MED ORDER — SODIUM CHLORIDE 0.9 % IV SOLN
INTRAVENOUS | Status: DC
  Administered 2017-12-12 – 2017-12-14 (×6): via INTRAVENOUS

## 2017-12-12 MED ORDER — SODIUM CHLORIDE 0.9 % IV BOLUS
1000.0000 mL | Freq: Once | INTRAVENOUS | Status: AC
  Administered 2017-12-12: 1000 mL via INTRAVENOUS

## 2017-12-12 NOTE — Progress Notes (Signed)
PROGRESS NOTE    Jeffrey Mathis  UEA:[id number] DOB: [birth year] DOA: 12/11/2017 [facility]: [facility]    Brief Narrative:  Patient is a 37 year old gentleman history of PTSD, prior history of heavy alcohol use however has been abstinent for a few months presented to the ED with complaints of epigastric pain, nausea and vomiting x3 days.  Patient noted on admission to have a lipase level of 06/08/2001.  CT abdomen and pelvis with acute pancreatitis without necrosis or fluid collection with marked hepatic steatosis.  Patient noted to have a transaminitis.  Patient admitted for further evaluation and management.   Assessment & Plan:   Principal Problem:   Acute pancreatitis Active Problems:   Pancreatitis   Leukocytosis   Hepatic steatosis  #1 acute pancreatitis Questionable etiology.  Patient with a prior history of significant alcohol use.  CT abdomen and pelvis with no evidence of biliary obstruction or stones.  Peripancreatic edema and generalized pancreatic expansion with no necrosis or fluid collection noted.  Hepatic steatosis.  Fasting lipid panel with a triglycerides of 106.  Patient with clinical improvement.  Given 1 L bolus of normal saline.  IV fluids normal saline 150 cc/h.  LFTs trending down.  Alcohol level less than 10.  Lipase level at 839 from 06/08/2001 on admission.  Ice chips and if tolerated advance to clear liquids.  Supportive care.  2.  Hepatic steatosis Outpatient follow-up.  3.  Transaminitis Likely secondary to problem #1.  LFTs trending down.  Follow.  4.  Leukocytosis Likely a reactive leukocytosis secondary to problem #1.  Patient afebrile.  WBC trending down.  Patient with no respiratory symptoms.  Urinalysis nitrite negative leukocytes negative.  No need for antibiotics at this time.  Follow.   DVT prophylaxis: Lovenox Code Status: Full Family Communication: Updated patient.  No family present. Disposition Plan: Home once acute pancreatitis has  resolved with clinical improvement and tolerating a solid diet.   Consultants:   None  Procedures:   CT abdomen and pelvis 12/11/2017  Antimicrobials:   None   Subjective: Patient sitting up in bed.  States some improvement with abdominal pain.  States he used to have a prior history of heavy alcohol use however has quit over the past few months.  Denies any further nausea or vomiting.  Objective: Vitals:   12/11/17 2154 12/11/17 2154 12/12/17 0531 12/12/17 1405  BP: 135/81 135/81 123/86 (!) 127/91  Pulse: (!) 59 (!) 56 86 99  Resp: 14 14 14 18   Temp: 98.5 F (36.9 C) 98.5 F (36.9 C) 98.1 F (36.7 C) 98 F (36.7 C)  TempSrc: Oral Oral Oral Oral  SpO2: 98% 99% 98% 98%  Weight:  76.4 kg (168 lb 6.4 oz) 76.1 kg (167 lb 12.3 oz)   Height:  6\' 1"  (1.854 m)      Intake/Output Summary (Last 24 hours) at 12/12/2017 1651 Last data filed at 12/12/2017 1309 Gross per 24 hour  Intake 2931.66 ml  Output 1300 ml  Net 1631.66 ml   Filed Weights   12/11/17 2151 12/11/17 2154 12/12/17 0531  Weight: 76.4 kg (168 lb 6.9 oz) 76.4 kg (168 lb 6.4 oz) 76.1 kg (167 lb 12.3 oz)    Examination:  General exam: Appears calm and comfortable  Respiratory system: Clear to auscultation. Respiratory effort normal. Cardiovascular system: S1 & S2 heard, RRR. No JVD, murmurs, rubs, gallops or clicks. No pedal edema. Gastrointestinal system: Abdomen is nondistended, soft and decreased tenderness to palpation in the epigastrium.  Positive bowel sounds.  No rebound.  No guarding.   Central nervous system: Alert and oriented. No focal neurological deficits. Extremities: Symmetric 5 x 5 power. Skin: No rashes, lesions or ulcers Psychiatry: Judgement and insight appear normal. Mood & affect appropriate.     Data Reviewed: I have personally reviewed following labs and imaging studies  CBC: Recent Labs  Lab 12/11/17 1753 12/12/17 0422  WBC 18.2* 14.5*  HGB 17.0 14.4  HCT 48.2 41.9  MCV 96.8  99.1  PLT 242 212   Basic Metabolic Panel: Recent Labs  Lab 12/11/17 1753 12/12/17 0422  NA 139 142  K 3.8 4.0  CL 100 105  CO2 22 25  GLUCOSE 92 86  BUN 12 8  CREATININE 0.89 0.77  CALCIUM 9.8 8.8*   GFR: Estimated Creatinine Clearance: 140 mL/min (by C-G formula based on SCr of 0.77 mg/dL). Liver Function Tests: Recent Labs  Lab 12/11/17 1753 12/12/17 0422  AST 68* 38  ALT 84* 57*  ALKPHOS 97 73  BILITOT 2.4* 1.8*  PROT 7.8 6.3*  ALBUMIN 4.4 3.5   Recent Labs  Lab 12/11/17 1753 12/12/17 0422  LIPASE 1,503* 839*   No results for input(s): AMMONIA in the last 168 hours. Coagulation Profile: No results for input(s): INR, PROTIME in the last 168 hours. Cardiac Enzymes: No results for input(s): CKTOTAL, CKMB, CKMBINDEX, TROPONINI in the last 168 hours. BNP (last 3 results) No results for input(s): PROBNP in the last 8760 hours. HbA1C: No results for input(s): HGBA1C in the last 72 hours. CBG: No results for input(s): GLUCAP in the last 168 hours. Lipid Profile: Recent Labs    12/12/17 0422  CHOL 165  HDL 42  LDLCALC 102*  TRIG 106  CHOLHDL 3.9   Thyroid Function Tests: No results for input(s): TSH, T4TOTAL, FREET4, T3FREE, THYROIDAB in the last 72 hours. Anemia Panel: No results for input(s): VITAMINB12, FOLATE, FERRITIN, TIBC, IRON, RETICCTPCT in the last 72 hours. Sepsis Labs: No results for input(s): PROCALCITON, LATICACIDVEN in the last 168 hours.  No results found for this or any previous visit (from the past 240 hour(s)).       Radiology Studies: Ct Abdomen Pelvis W Contrast  Result Date: 12/11/2017 CLINICAL DATA:  Elevated white count.  Upper abdominal pain EXAM: CT ABDOMEN AND PELVIS WITH CONTRAST TECHNIQUE: Multidetector CT imaging of the abdomen and pelvis was performed using the standard protocol following bolus administration of intravenous contrast. CONTRAST:  100mL ISOVUE-300 IOPAMIDOL (ISOVUE-300) INJECTION 61% COMPARISON:  None.  FINDINGS: Lower chest:  No contributory findings. Hepatobiliary: Hepatic steatosis.No evidence of biliary obstruction or stone. Pancreas: Peripancreatic edema and generalized pancreatic expansion. No necrosis or collection. No neighboring vascular complication. Spleen: Unremarkable. Adrenals/Urinary Tract: Negative adrenals. No hydronephrosis or stone. Unremarkable bladder. Stomach/Bowel: Nonspecific prominent submucosal fat deposition within the colon. No evidence of acute inflammation. History of appendectomy. Vascular/Lymphatic: No acute vascular abnormality. No mass or adenopathy. Reproductive:Negative Other: Small volume ascites considered reactive Musculoskeletal: No acute abnormalities. IMPRESSION: 1. Acute pancreatitis without necrosis or collection. 2. Marked hepatic steatosis. Electronically Signed   By: Marnee SpringJonathon  Watts M.D.   On: 12/11/2017 19:54        Scheduled Meds: . enoxaparin (LOVENOX) injection  40 mg Subcutaneous Q24H  . Melatonin  5 mg Oral QHS  . pantoprazole  40 mg Oral Daily   Continuous Infusions: . sodium chloride 150 mL/hr at 12/12/17 1055     LOS: 1 day    Time spent: 35 minutes  Ramiro Harvest, MD [facility] Pager [phone]  If 7PM-7AM, please contact night-coverage [email or web address] Password [facility] 12/12/2017, 4:51 PM

## 2017-12-12 NOTE — Plan of Care (Signed)
?  Problem: Elimination: ?Goal: Will not experience complications related to bowel motility ?Outcome: Progressing ?  ?Problem: Pain Managment: ?Goal: General experience of comfort will improve ?Outcome: Progressing ?  ?Problem: Safety: ?Goal: Ability to remain free from injury will improve ?Outcome: Progressing ?  ?

## 2017-12-13 LAB — COMPREHENSIVE METABOLIC PANEL
ALBUMIN: 2.9 g/dL — AB (ref 3.5–5.0)
ALT: 42 U/L (ref 0–44)
ANION GAP: 7 (ref 5–15)
AST: 32 U/L (ref 15–41)
Alkaline Phosphatase: 60 U/L (ref 38–126)
BUN: 5 mg/dL — ABNORMAL LOW (ref 6–20)
CHLORIDE: 105 mmol/L (ref 98–111)
CO2: 26 mmol/L (ref 22–32)
Calcium: 8.7 mg/dL — ABNORMAL LOW (ref 8.9–10.3)
Creatinine, Ser: 0.67 mg/dL (ref 0.61–1.24)
GFR calc Af Amer: >60 mL/min (ref >60)
GFR calc non Af Amer: >60 mL/min (ref >60)
GLUCOSE: 94 mg/dL (ref 70–99)
POTASSIUM: 3.9 mmol/L (ref 3.5–5.1)
SODIUM: 138 mmol/L (ref 135–145)
TOTAL PROTEIN: 5.6 g/dL — AB (ref 6.5–8.1)
Total Bilirubin: 1.5 mg/dL — ABNORMAL HIGH (ref 0.3–1.2)

## 2017-12-13 LAB — CBC WITH DIFFERENTIAL/PLATELET
BASOS ABS: 0 10*3/uL (ref 0.0–0.1)
BASOS PCT: 0 %
EOS ABS: 0.4 10*3/uL (ref 0.0–0.7)
Eosinophils Relative: 4 %
HCT: 37.4 % — ABNORMAL LOW (ref 39.0–52.0)
Hemoglobin: 13 g/dL (ref 13.0–17.0)
Lymphocytes Relative: 19 %
Lymphs Abs: 1.9 10*3/uL (ref 0.7–4.0)
MCH: 34 pg (ref 26.0–34.0)
MCHC: 34.8 g/dL (ref 30.0–36.0)
MCV: 97.9 fL (ref 78.0–100.0)
MONO ABS: 0.9 10*3/uL (ref 0.1–1.0)
MONOS PCT: 9 %
NEUTROS PCT: 68 %
Neutro Abs: 6.8 10*3/uL (ref 1.7–7.7)
PLATELETS: 173 10*3/uL (ref 150–400)
RBC: 3.82 MIL/uL — ABNORMAL LOW (ref 4.22–5.81)
RDW: 12.7 % (ref 11.5–15.5)
WBC: 10 10*3/uL (ref 4.0–10.5)

## 2017-12-13 LAB — HEPATITIS PANEL, ACUTE
HEP A IGM: NEGATIVE
Hep B C IgM: NEGATIVE
Hepatitis B Surface Ag: NEGATIVE

## 2017-12-13 LAB — LIPASE, BLOOD: Lipase: 130 U/L — ABNORMAL HIGH (ref 11–51)

## 2017-12-13 NOTE — Progress Notes (Signed)
[facility], RN CM called to give the following information. Pt's PCP is Dr. Jess BartersBorum, SW Adonis HousekeeperMaron Nolan [phone] ext [phone], pager [phone].

## 2017-12-13 NOTE — Progress Notes (Signed)
PROGRESS NOTE    Jeffrey Mathis  ZOX:[id number] DOB: Oct 06, 1980 DOA: 12/11/2017 [facility]: [facility]    Brief Narrative:  Patient is a 37 year old gentleman history of PTSD, prior history of heavy alcohol use however has been abstinent for a few months presented to the ED with complaints of epigastric pain, nausea and vomiting x3 days.  Patient noted on admission to have a lipase level of 06/08/2001.  CT abdomen and pelvis with acute pancreatitis without necrosis or fluid collection with marked hepatic steatosis.  Patient noted to have a transaminitis.  Patient admitted for further evaluation and management.   Assessment & Plan:   Principal Problem:   Acute pancreatitis Active Problems:   Pancreatitis   Leukocytosis   Hepatic steatosis   Transaminitis  #1 acute pancreatitis Questionable etiology.  Patient with a prior history of significant alcohol use.  CT abdomen and pelvis with no evidence of biliary obstruction or stones.  Peripancreatic edema and generalized pancreatic expansion with no necrosis or fluid collection noted.  Hepatic steatosis.  Fasting lipid panel with a triglycerides of 106.  Patient with clinical improvement.  Patient hydrated aggressively with IV fluids.  LFTs trended down.  Alcohol level less than 10.  Lipase at 130 from 839 from 1503 on admission.  Patient tolerating clear liquids.  Advance to full liquid diet and if tolerated advance to a soft diet. Supportive care.  2.  Hepatic steatosis Outpatient follow-up.  3.  Transaminitis Likely secondary to problem #1.  LFTs trending down.  Follow.  4.  Leukocytosis Likely a reactive leukocytosis secondary to problem #1.  Patient afebrile.  WBC trending down.  Patient with no respiratory symptoms.  Urinalysis nitrite negative leukocytes negative.  No need for antibiotics at this time.  Follow.   DVT prophylaxis: Lovenox Code Status: Full Family Communication: Updated patient and wife at  bedside. Disposition Plan: Home once acute pancreatitis has resolved with clinical improvement and tolerating a solid diet hopefully in 24 hours.   Consultants:   None  Procedures:   CT abdomen and pelvis 12/11/2017  Antimicrobials:   None   Subjective: Patient states abdominal pain has improved.  Denies any nausea or emesis.  Tolerated clears.  Feeling significantly better than on admission.    Objective: Vitals:   12/12/17 0531 12/12/17 1405 12/12/17 2111 12/13/17 0514  BP: 123/86 (!) 127/91 128/86 133/82  Pulse: 86 99 (!) 50 (!) 43  Resp: 14 18 18 18   Temp: 98.1 F (36.7 C) 98 F (36.7 C) 98.5 F (36.9 C) 98.8 F (37.1 C)  TempSrc: Oral Oral Oral Oral  SpO2: 98% 98% 98% 99%  Weight: 76.1 kg (167 lb 12.3 oz)   77.7 kg (171 lb 4.8 oz)  Height:        Intake/Output Summary (Last 24 hours) at 12/13/2017 0954 Last data filed at 12/13/2017 0600 Gross per 24 hour  Intake 4732 ml  Output 900 ml  Net 3832 ml   Filed Weights   12/11/17 2154 12/12/17 0531 12/13/17 0514  Weight: 76.4 kg (168 lb 6.4 oz) 76.1 kg (167 lb 12.3 oz) 77.7 kg (171 lb 4.8 oz)    Examination:  General exam: NAD. Respiratory system: CTAB. Respiratory effort normal. Cardiovascular system: Regular rate rhythm no murmurs rubs or gallops.  No JVD.  No lower extremity edema.  Gastrointestinal system: Abdomen is soft, nontender, nondistended, positive bowel sounds.  No rebound.  No guarding.   Central nervous system: Alert and oriented. No focal neurological deficits. Extremities: Symmetric  5 x 5 power. Skin: No rashes, lesions or ulcers Psychiatry: Judgement and insight appear normal. Mood & affect appropriate.     Data Reviewed: I have personally reviewed following labs and imaging studies  CBC: Recent Labs  Lab 12/11/17 1753 12/12/17 0422 12/13/17 0436  WBC 18.2* 14.5* 10.0  NEUTROABS  --   --  6.8  HGB 17.0 14.4 13.0  HCT 48.2 41.9 37.4*  MCV 96.8 99.1 97.9  PLT 242 212 173   Basic  Metabolic Panel: Recent Labs  Lab 12/11/17 1753 12/12/17 0422 12/13/17 0436  NA 139 142 138  K 3.8 4.0 3.9  CL 100 105 105  CO2 22 25 26   GLUCOSE 92 86 94  BUN 12 8 5*  CREATININE 0.89 0.77 0.67  CALCIUM 9.8 8.8* 8.7*   GFR: Estimated Creatinine Clearance: 143 mL/min (by C-G formula based on SCr of 0.67 mg/dL). Liver Function Tests: Recent Labs  Lab 12/11/17 1753 12/12/17 0422 12/13/17 0436  AST 68* 38 32  ALT 84* 57* 42  ALKPHOS 97 73 60  BILITOT 2.4* 1.8* 1.5*  PROT 7.8 6.3* 5.6*  ALBUMIN 4.4 3.5 2.9*   Recent Labs  Lab 12/11/17 1753 12/12/17 0422 12/13/17 0436  LIPASE 1,503* 839* 130*   No results for input(s): AMMONIA in the last 168 hours. Coagulation Profile: No results for input(s): INR, PROTIME in the last 168 hours. Cardiac Enzymes: No results for input(s): CKTOTAL, CKMB, CKMBINDEX, TROPONINI in the last 168 hours. BNP (last 3 results) No results for input(s): PROBNP in the last 8760 hours. HbA1C: No results for input(s): HGBA1C in the last 72 hours. CBG: No results for input(s): GLUCAP in the last 168 hours. Lipid Profile: Recent Labs    12/12/17 0422  CHOL 165  HDL 42  LDLCALC 102*  TRIG 106  CHOLHDL 3.9   Thyroid Function Tests: No results for input(s): TSH, T4TOTAL, FREET4, T3FREE, THYROIDAB in the last 72 hours. Anemia Panel: No results for input(s): VITAMINB12, FOLATE, FERRITIN, TIBC, IRON, RETICCTPCT in the last 72 hours. Sepsis Labs: No results for input(s): PROCALCITON, LATICACIDVEN in the last 168 hours.  No results found for this or any previous visit (from the past 240 hour(s)).       Radiology Studies: Ct Abdomen Pelvis W Contrast  Result Date: 12/11/2017 CLINICAL DATA:  Elevated white count.  Upper abdominal pain EXAM: CT ABDOMEN AND PELVIS WITH CONTRAST TECHNIQUE: Multidetector CT imaging of the abdomen and pelvis was performed using the standard protocol following bolus administration of intravenous contrast. CONTRAST:   ISOVUE-300 IOPAMIDOL (ISOVUE-300) INJECTION 61% COMPARISON:  None. FINDINGS: Lower chest:  No contributory findings. Hepatobiliary: Hepatic steatosis.No evidence of biliary obstruction or stone. Pancreas: Peripancreatic edema and generalized pancreatic expansion. No necrosis or collection. No neighboring vascular complication. Spleen: Unremarkable. Adrenals/Urinary Tract: Negative adrenals. No hydronephrosis or stone. Unremarkable bladder. Stomach/Bowel: Nonspecific prominent submucosal fat deposition within the colon. No evidence of acute inflammation. History of appendectomy. Vascular/Lymphatic: No acute vascular abnormality. No mass or adenopathy. Reproductive:Negative Other: Small volume ascites considered reactive Musculoskeletal: No acute abnormalities. IMPRESSION: 1. Acute pancreatitis without necrosis or collection. 2. Marked hepatic steatosis. Electronically Signed   By: Marnee Spring M.D.   On: 12/11/2017 19:54        Scheduled Meds: . enoxaparin (LOVENOX) injection  40 mg Subcutaneous Q24H  . Melatonin  5 mg Oral QHS  . pantoprazole  40 mg Oral Daily   Continuous Infusions: . sodium chloride 150 mL/hr at 12/13/17 0753  LOS: 2 days    Time spent: 35 minutes    Ramiro Harvestaniel Thompson, MD [facility] Pager [phone]  If 7PM-7AM, please contact night-coverage [email or web address] Password [email or web address] 12/13/2017, 9:54 AM

## 2017-12-14 LAB — COMPREHENSIVE METABOLIC PANEL
ALK PHOS: 60 U/L (ref 38–126)
ALT: 41 U/L (ref 0–44)
ANION GAP: 6 (ref 5–15)
AST: 40 U/L (ref 15–41)
Albumin: 2.9 g/dL — ABNORMAL LOW (ref 3.5–5.0)
BILIRUBIN TOTAL: 1.2 mg/dL (ref 0.3–1.2)
BUN: <5 mg/dL — ABNORMAL LOW (ref 6–20)
CO2: 27 mmol/L (ref 22–32)
Calcium: 8.7 mg/dL — ABNORMAL LOW (ref 8.9–10.3)
Chloride: 106 mmol/L (ref 98–111)
Creatinine, Ser: 0.6 mg/dL — ABNORMAL LOW (ref 0.61–1.24)
Glucose, Bld: 98 mg/dL (ref 70–99)
Potassium: 3.6 mmol/L (ref 3.5–5.1)
Sodium: 139 mmol/L (ref 135–145)
TOTAL PROTEIN: 5.7 g/dL — AB (ref 6.5–8.1)

## 2017-12-14 LAB — LIPASE, BLOOD: Lipase: 134 U/L — ABNORMAL HIGH (ref 11–51)

## 2017-12-14 MED ORDER — IBUPROFEN 200 MG PO TABS
400.0000 mg | ORAL_TABLET | Freq: Four times a day (QID) | ORAL | Status: DC | PRN
  Administered 2017-12-14: 400 mg via ORAL
  Filled 2017-12-14: qty 2

## 2017-12-14 MED ORDER — POTASSIUM CHLORIDE CRYS ER 20 MEQ PO TBCR
40.0000 meq | EXTENDED_RELEASE_TABLET | Freq: Once | ORAL | Status: AC
  Administered 2017-12-14: 40 meq via ORAL
  Filled 2017-12-14: qty 2

## 2017-12-14 MED ORDER — PANTOPRAZOLE SODIUM 40 MG PO TBEC
40.0000 mg | DELAYED_RELEASE_TABLET | Freq: Every day | ORAL | 0 refills | Status: DC
Start: 2017-12-15 — End: 2021-05-17

## 2017-12-14 NOTE — Discharge Summary (Signed)
Physician Discharge Summary  Jeffrey Mathis IHK:[id number] DOB: 29-Oct-1980 DOA: 12/11/2017  PCP: [facility]  Admit date: 12/11/2017 Discharge date: 12/14/2017  Time spent: 55 minutes  Recommendations for Outpatient Follow-up:  1. Follow-up with [facility], [facility] in 2 weeks.  On follow-up patient will need a comprehensive metabolic profile done to follow-up on electrolytes, LFTs and renal function.   Discharge Diagnoses:  Principal Problem:   Acute pancreatitis Active Problems:   Pancreatitis   Leukocytosis   Hepatic steatosis   Transaminitis   Discharge Condition: Stable and improved.  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  Denies fever, chills, wt loss, diarrhea, brbpr, black stool.  Pt went to [facility] and was told to go to ER. Pt admits to taking ibuprofen OTC 3 per day.  In Ed,  IMPRESSION: 1. Acute pancreatitis without necrosis or collection. 2. Marked hepatic steatosis.  Lipase 1,503 Na 139, K 3.8 Bun 12, creatinine 0.89 Ast 68, Alt 84 Alk phos 97, T. Bili 2.4  Wbc 18.2, Hgb 17.0, Plt 242 urinalysis negative Pt will be admitted for pancreatitis of unknown cause.      Hospital Course:  1 acute pancreatitis Questionable etiology.  Patient with a prior history of significant alcohol use.  CT abdomen and pelvis with no evidence of biliary obstruction or stones.  Peripancreatic edema and generalized pancreatic expansion with no necrosis or fluid collection noted.  Hepatic steatosis.  Fasting lipid panel with a triglycerides of 106.  Patient was admitted placed on aggressive IV fluid hydration, bowel rest, pain management, supportive care.  Alcohol level which was obtained was less  than 10.  Lipase on admission was 1503.  Patient improved clinically during the hospitalization and was subsequently started on clear liquid diet which he tolerated.  Pain improved.  Diet was advanced to a soft diet which he tolerated.  LFTs trended down and had resolved by day of discharge.  Lipase levels trended down such that by day of discharge lipase was down to 134 from 1503 on admission.  Patient will be discharged home in stable and improved condition and is to follow-up with PCP in the outpatient setting.    2.  Hepatic steatosis Outpatient follow-up.  3.  Transaminitis Likely secondary to problem #1.  LFTs trended down and had resolved by day of discharge.    4.  Leukocytosis Likely a reactive leukocytosis secondary to problem #1.  Patient remained afebrile.  WBC trended down.  Patient with no respiratory symptoms.  Urinalysis nitrite negative leukocytes negative.    Leukocytosis had resolved by day of discharge.      Procedures:  CT abdomen and pelvis 12/11/2017      Consultations:  None  Discharge Exam: Vitals:   12/13/17 2200 12/14/17 0522  BP: (!) 136/95 (!) 128/91  Pulse: (!) 56 62  Resp: 18 12  Temp: 98.3 F (36.8 C) 98.1 F (36.7 C)  SpO2: 99% 99%    General: NAD Cardiovascular: RRR Respiratory: CTAB  Discharge Instructions   Discharge Instructions    Diet general   Complete by:  As directed    Increase activity slowly   Complete by:  As directed  Allergies as of 12/14/2017   No Known Allergies     Medication List    STOP taking these medications   amoxicillin 500 MG capsule Commonly known as:  AMOXIL   HYDROcodone-acetaminophen 5-325 MG tablet Commonly known as:  NORCO/VICODIN     TAKE these medications   ibuprofen 200 MG tablet Commonly known as:  ADVIL,MOTRIN Take 600 mg by mouth daily as needed for moderate pain.   Melatonin 5 MG Tabs Take 5 mg by mouth at bedtime.   pantoprazole 40 MG tablet Commonly known as:   PROTONIX Take 1 tablet (40 mg total) by mouth daily. Start taking on:  12/15/2017      No Known Allergies Follow-up Information    [facility]. Schedule an appointment as soon as possible for a visit in 2 week(s).   Contact information: [place] [address] [phone]            The results of significant diagnostics from this hospitalization (including imaging, microbiology, ancillary and laboratory) are listed below for reference.    Significant Diagnostic Studies: Ct Abdomen Pelvis W Contrast  Result Date: 12/11/2017 CLINICAL DATA:  Elevated white count.  Upper abdominal pain EXAM: CT ABDOMEN AND PELVIS WITH CONTRAST TECHNIQUE: Multidetector CT imaging of the abdomen and pelvis was performed using the standard protocol following bolus administration of intravenous contrast. CONTRAST:  139m ISOVUE-300 IOPAMIDOL (ISOVUE-300) INJECTION 61% COMPARISON:  None. FINDINGS: Lower chest:  No contributory findings. Hepatobiliary: Hepatic steatosis.No evidence of biliary obstruction or stone. Pancreas: Peripancreatic edema and generalized pancreatic expansion. No necrosis or collection. No neighboring vascular complication. Spleen: Unremarkable. Adrenals/Urinary Tract: Negative adrenals. No hydronephrosis or stone. Unremarkable bladder. Stomach/Bowel: Nonspecific prominent submucosal fat deposition within the colon. No evidence of acute inflammation. History of appendectomy. Vascular/Lymphatic: No acute vascular abnormality. No mass or adenopathy. Reproductive:Negative Other: Small volume ascites considered reactive Musculoskeletal: No acute abnormalities. IMPRESSION: 1. Acute pancreatitis without necrosis or collection. 2. Marked hepatic steatosis. Electronically Signed   By: JMonte FantasiaM.D.   On: 12/11/2017 19:54    Microbiology: No results found for this or any previous visit (from the past 240 hour(s)).   Labs: Basic Metabolic  Panel: Recent Labs  Lab 12/11/17 1753 12/12/17 0422 12/13/17 0436 12/14/17 0354  NA 139 142 138 139  K 3.8 4.0 3.9 3.6  CL 100 105 105 106  CO2 22 25 26 27   GLUCOSE 92 86 94 98  BUN 12 8 5* <5*  CREATININE 0.89 0.77 0.67 0.60*  CALCIUM 9.8 8.8* 8.7* 8.7*   Liver Function Tests: Recent Labs  Lab 12/11/17 1753 12/12/17 0422 12/13/17 0436 12/14/17 0354  AST 68* 38 32 40  ALT 84* 57* 42 41  ALKPHOS 97 73 60 60  BILITOT 2.4* 1.8* 1.5* 1.2  PROT 7.8 6.3* 5.6* 5.7*  ALBUMIN 4.4 3.5 2.9* 2.9*   Recent Labs  Lab 12/11/17 1753 12/12/17 0422 12/13/17 0436 12/14/17 0354  LIPASE 1,503* 839* 130* 134*   No results for input(s): AMMONIA in the last 168 hours. CBC: Recent Labs  Lab 12/11/17 1753 12/12/17 0422 12/13/17 0436  WBC 18.2* 14.5* 10.0  NEUTROABS  --   --  6.8  HGB 17.0 14.4 13.0  HCT 48.2 41.9 37.4*  MCV 96.8 99.1 97.9  PLT 242 212 173   Cardiac Enzymes: No results for input(s): CKTOTAL, CKMB, CKMBINDEX, TROPONINI in the last 168 hours. BNP: BNP (last 3 results) No results for input(s): BNP in the last 8760 hours.  ProBNP (last 3 results) No results for input(s): PROBNP in the last 8760 hours.  CBG: No results for input(s): GLUCAP in the last 168 hours.     Signed:  Irine Seal MD.  [facility] 12/14/2017, 1:11 PM

## 2017-12-14 NOTE — Plan of Care (Signed)
Discharge instructions reviewed with patient and wife, questions answered, verbalized understanding.  Patient ambulatory from unit accompanied by RN to main entrance to be taken home by wife.  Patient had protonix prescription and all belongings in his possession at time of discharge.

## 2018-02-03 ENCOUNTER — Emergency Department (HOSPITAL_COMMUNITY): Payer: Self-pay

## 2018-02-03 ENCOUNTER — Emergency Department (HOSPITAL_COMMUNITY)
Admission: EM | Admit: 2018-02-03 | Discharge: 2018-02-03 | Disposition: A | Discharge status: Home / Self Care | Payer: Self-pay | Attending: Emergency Medicine | Admitting: Emergency Medicine

## 2018-02-03 ENCOUNTER — Encounter (HOSPITAL_COMMUNITY): Payer: Self-pay | Admitting: *Deleted

## 2018-02-03 DIAGNOSIS — Z79899 Other long term (current) drug therapy: Secondary | ICD-10-CM | POA: Insufficient documentation

## 2018-02-03 DIAGNOSIS — R1013 Epigastric pain: Secondary | ICD-10-CM | POA: Insufficient documentation

## 2018-02-03 DIAGNOSIS — R1011 Right upper quadrant pain: Secondary | ICD-10-CM | POA: Insufficient documentation

## 2018-02-03 DIAGNOSIS — K859 Acute pancreatitis without necrosis or infection, unspecified: Secondary | ICD-10-CM | POA: Insufficient documentation

## 2018-02-03 DIAGNOSIS — R112 Nausea with vomiting, unspecified: Secondary | ICD-10-CM | POA: Insufficient documentation

## 2018-02-03 DIAGNOSIS — F17210 Nicotine dependence, cigarettes, uncomplicated: Secondary | ICD-10-CM | POA: Insufficient documentation

## 2018-02-03 HISTORY — DX: Acute pancreatitis without necrosis or infection, unspecified: K85.90

## 2018-02-03 LAB — I-STAT CG4 LACTIC ACID, ED
LACTIC ACID, VENOUS: 2.45 mmol/L — AB (ref 0.5–1.9)
LACTIC ACID, VENOUS: 1.75 mmol/L (ref 0.5–1.9)

## 2018-02-03 LAB — COMPREHENSIVE METABOLIC PANEL
ALT: 173 U/L — AB (ref 0–44)
AST: 137 U/L — AB (ref 15–41)
Albumin: 3.9 g/dL (ref 3.5–5.0)
Alkaline Phosphatase: 90 U/L (ref 38–126)
Anion gap: 15 (ref 5–15)
BUN: 6 mg/dL (ref 6–20)
CHLORIDE: 106 mmol/L (ref 98–111)
CO2: 24 mmol/L (ref 22–32)
CREATININE: 0.75 mg/dL (ref 0.61–1.24)
Calcium: 9 mg/dL (ref 8.9–10.3)
GFR calc non Af Amer: >60 mL/min (ref >60)
Glucose, Bld: 116 mg/dL — ABNORMAL HIGH (ref 70–99)
Potassium: 3.4 mmol/L — ABNORMAL LOW (ref 3.5–5.1)
SODIUM: 145 mmol/L (ref 135–145)
Total Bilirubin: 0.7 mg/dL (ref 0.3–1.2)
Total Protein: 7.1 g/dL (ref 6.5–8.1)

## 2018-02-03 LAB — URINALYSIS, ROUTINE W REFLEX MICROSCOPIC
BACTERIA UA: NONE SEEN
Bilirubin Urine: NEGATIVE
GLUCOSE, UA: NEGATIVE mg/dL
Hgb urine dipstick: NEGATIVE
Ketones, ur: NEGATIVE mg/dL
Leukocytes, UA: NEGATIVE
Nitrite: NEGATIVE
PROTEIN: 30 mg/dL — AB
Specific Gravity, Urine: 1.019 (ref 1.005–1.030)
pH: 6 (ref 5.0–8.0)

## 2018-02-03 LAB — CBC
HEMATOCRIT: 43.4 % (ref 39.0–52.0)
Hemoglobin: 15.4 g/dL (ref 13.0–17.0)
MCH: 33.8 pg (ref 26.0–34.0)
MCHC: 35.5 g/dL (ref 30.0–36.0)
MCV: 95.4 fL (ref 78.0–100.0)
PLATELETS: 291 10*3/uL (ref 150–400)
RBC: 4.55 MIL/uL (ref 4.22–5.81)
RDW: 13.2 % (ref 11.5–15.5)
WBC: 13.7 10*3/uL — ABNORMAL HIGH (ref 4.0–10.5)

## 2018-02-03 LAB — I-STAT BETA HCG BLOOD, ED (MC, WL, AP ONLY): I-stat hCG, quantitative: <5 m[IU]/mL (ref <5)

## 2018-02-03 LAB — LIPASE, BLOOD: Lipase: 567 U/L — ABNORMAL HIGH (ref 11–51)

## 2018-02-03 MED ORDER — HYDROMORPHONE HCL 1 MG/ML IJ SOLN
1.0000 mg | Freq: Once | INTRAMUSCULAR | Status: AC
  Administered 2018-02-03: 1 mg via INTRAVENOUS
  Filled 2018-02-03: qty 1

## 2018-02-03 MED ORDER — ONDANSETRON HCL 4 MG PO TABS: 4.0000 mg | ORAL_TABLET | Freq: Three times a day (TID) | ORAL | 0 refills | Status: DC | PRN

## 2018-02-03 MED ORDER — SODIUM CHLORIDE 0.9 % IV BOLUS
1000.0000 mL | Freq: Once | INTRAVENOUS | Status: AC
  Administered 2018-02-03: 1000 mL via INTRAVENOUS

## 2018-02-03 MED ORDER — OXYCODONE HCL 5 MG PO TABS: 5.0000 mg | ORAL_TABLET | ORAL | 0 refills | Status: DC | PRN

## 2018-02-03 MED ORDER — LIDOCAINE HCL URETHRAL/MUCOSAL 2 % EX GEL: 1.0000 "application " | Freq: Once | CUTANEOUS | Status: DC

## 2018-02-03 MED ORDER — ONDANSETRON HCL 4 MG/2ML IJ SOLN
4.0000 mg | Freq: Once | INTRAMUSCULAR | Status: AC
  Administered 2018-02-03: 4 mg via INTRAVENOUS
  Filled 2018-02-03: qty 2

## 2018-02-03 NOTE — ED Notes (Signed)
Pt currently vomiting. RN notified.  

## 2018-02-03 NOTE — ED Provider Notes (Signed)
[facility]-EMERG[facility] DEPT Provider Note   CSN: [id number] Arrival date & time: 02/03/18  1429     History   Chief Complaint Chief Complaint  Patient presents with  . Abdominal Pain    HPI Jeffrey Mathis is a 37 y.o. male.  The history is provided by the patient, the spouse and medical records. No language interpreter was used.  Abdominal Pain   This is a recurrent problem. The current episode started 12 to 24 hours ago. The problem occurs constantly. The problem has been gradually worsening. The pain is associated with eating. The pain is located in the epigastric region, RUQ and LUQ. The quality of the pain is aching and sharp. The pain is at a severity of 10/10. The pain is severe. Associated symptoms include nausea and dysuria. Pertinent negatives include fever, diarrhea (resolved), vomiting, constipation, frequency, hematuria and headaches. The symptoms are aggravated by palpation and eating. Nothing relieves the symptoms. His past medical history does not include gallstones.    Past Medical History:  Diagnosis Date  . Pancreatitis   . PTSD (post-traumatic stress disorder)     Patient Active Problem List   Diagnosis Date Noted  . Acute pancreatitis 12/12/2017  . Leukocytosis 12/12/2017  . Hepatic steatosis 12/12/2017  . Transaminitis   . Pancreatitis 12/11/2017    Past Surgical History:  Procedure Laterality Date  . ABDOMINAL SURGERY     scrapnel  . APPENDECTOMY    . arm surgery    . KNEE ARTHROSCOPY          Home Medications    Prior to Admission medications   Medication Sig Start Date End Date Taking? Authorizing Provider  ibuprofen (ADVIL,MOTRIN) 200 MG tablet Take 600 mg by mouth daily as needed for moderate pain.    [provider]  Melatonin 5 MG TABS Take 5 mg by mouth at bedtime.    [provider]  pantoprazole (PROTONIX) 40 MG tablet Take 1 tablet (40 mg total) by mouth daily. 12/15/17   Rodolph Bong,  MD    Family History Family History  Problem Relation Age of Onset  . Cancer Mother   . Bipolar disorder Mother   . Cancer Father   . Pancreatitis Father     Social History Social History   Tobacco Use  . Smoking status: Current Every Day Smoker    Packs/day: 0.50    Types: Cigarettes  . Smokeless tobacco: Never Used  Substance Use Topics  . Alcohol use: Not Currently  . Drug use: Yes    Types: Marijuana    Comment: daily     Allergies   Patient has no known allergies.   Review of Systems Review of Systems  Constitutional: Negative for chills, diaphoresis, fatigue and fever.  HENT: Negative for congestion.   Respiratory: Negative for cough, chest tightness, shortness of breath and wheezing.   Cardiovascular: Negative for chest pain and palpitations.  Gastrointestinal: Positive for abdominal pain and nausea. Negative for abdominal distention, constipation, diarrhea (resolved) and vomiting.  Genitourinary: Positive for dysuria. Negative for flank pain, frequency and hematuria.  Musculoskeletal: Negative for back pain, neck pain and neck stiffness.  Skin: Negative for rash and wound.  Neurological: Negative for light-headedness and headaches.  Psychiatric/Behavioral: Negative for agitation.  All other systems reviewed and are negative.    Physical Exam Updated Vital Signs BP (!) 121/94   Pulse 67   Temp 98.5 F (36.9 C) (Oral)   Resp 15   SpO2 100%  Physical Exam  Constitutional: He is oriented to person, place, and time. He appears well-developed and well-nourished.  Non-toxic appearance. He appears ill. No distress.  HENT:  Head: Normocephalic and atraumatic.  Mouth/Throat: Oropharynx is clear and moist. No oropharyngeal exudate.  Eyes: Pupils are equal, round, and reactive to light. Conjunctivae and EOM are normal.  Neck: Normal range of motion. Neck supple.  Cardiovascular: Normal rate and regular rhythm.  No murmur heard. Pulmonary/Chest: Effort  normal and breath sounds normal. No respiratory distress. He has no wheezes. He has no rales. He exhibits no tenderness.  Abdominal: Soft. Normal appearance. He exhibits no distension. There is tenderness in the right upper quadrant, epigastric area and left upper quadrant.    Musculoskeletal: He exhibits no edema or tenderness.  Neurological: He is alert and oriented to person, place, and time. No sensory deficit. He exhibits normal muscle tone.  Skin: Skin is warm and dry. Capillary refill takes less than 2 seconds. He is not diaphoretic. No erythema. No pallor.  Psychiatric: He has a normal mood and affect.  Nursing note and vitals reviewed.    ED Treatments / Results  Labs (all labs ordered are listed, but only abnormal results are displayed) Labs Reviewed  LIPASE, BLOOD - Abnormal; Notable for the following components:      Result Value   Lipase 567 (*)    All other components within normal limits  COMPREHENSIVE METABOLIC PANEL - Abnormal; Notable for the following components:   Potassium 3.4 (*)    Glucose, Bld 116 (*)    AST 137 (*)    ALT 173 (*)    All other components within normal limits  CBC - Abnormal; Notable for the following components:   WBC 13.7 (*)    All other components within normal limits  URINALYSIS, ROUTINE W REFLEX MICROSCOPIC - Abnormal; Notable for the following components:   Protein, ur 30 (*)    All other components within normal limits  I-STAT CG4 LACTIC ACID, ED - Abnormal; Notable for the following components:   Lactic Acid, Venous 2.45 (*)    All other components within normal limits  URINE CULTURE  I-STAT CG4 LACTIC ACID, ED  I-STAT BETA HCG BLOOD, ED ([facility], [facility], [facility] ONLY)    EKG None  Radiology US Abdomen Limited Ruq  Result Date: 02/03/2018 CLINICAL DATA:  Right upper quadrant pain. EXAM: ULTRASOUND ABDOMEN LIMITED RIGHT UPPER QUADRANT COMPARISON:  None. FINDINGS: Gallbladder: No gallstones or wall thickening visualized. No sonographic  Murphy sign noted by sonographer. Common bile duct: Diameter: 3.5 mm Liver: Probable hepatic steatosis. Portal vein is patent on color Doppler imaging with normal direction of blood flow towards the liver. IMPRESSION: 1. Probable hepatic steatosis. No other acute abnormalities. The gallbladder and common bile duct are normal. Electronically Signed   By: Gerome Sam III M.D   On: 02/03/2018 22:30    Procedures Procedures (including critical care time)  Medications Ordered in ED Medications  HYDROmorphone (DILAUDID) injection 1 mg (1 mg Intravenous Given 02/03/18 1718)  ondansetron (ZOFRAN) injection 4 mg (4 mg Intravenous Given 02/03/18 1718)  sodium chloride 0.9 % bolus 1,000 mL (0 mLs Intravenous Stopped 02/03/18 1806)  HYDROmorphone (DILAUDID) injection 1 mg (1 mg Intravenous Given 02/03/18 1924)  ondansetron (ZOFRAN) injection 4 mg (4 mg Intravenous Given 02/03/18 1931)     Initial Impression / Assessment and Plan / ED Course  I have reviewed the triage vital signs and the nursing notes.  Pertinent labs & imaging results  that were available during my care of the patient were reviewed by me and considered in my medical decision making (see chart for details).     Jeffrey Mathis is a 37 y.o. male with a past medical history significant for prior appendectomy, PTSD, former alcohol abuse, and prior pancreatitis who presents with abdominal pain and nausea.  Patient reports that one month ago he had an episode of pancreatitis for the first time.  He says he has not had alcohol in over 9 months.  He says that he last month when he had pancreatitis that imaging showed no evidence of necrosis or abscess.  Patient was able to go home and did well until last week when he had an episode last a day of severe abdominal pain.  He reports that he has not eaten much over the last week to prevent the pain from recurring.  He does report that he has had some fatty foods over the last few days.  He says that today  his abdominal pain started and is now a 10 out of 10 severity.  He describes it is all across his upper abdomen.  He reports nausea no vomiting.  He denies any cough, congestion, fevers, or chills.  No chest pain or shortness of breath.  He reports his urine has been much darker and had some dysuria.  He also reports he had diarrhea several days ago that has resolved.  He denies recent trauma or other complaints.  He says that he has not had any success with medications at home.  On exam, patient has tenderness across his upper abdomen.  No CVA tenderness or flank tenderness.  Lungs clear chest nontender.  Patient appears uncomfortable.  Patient deferred GU exam and denies any groin complaints.  Legs nontender and nonswollen.  Clinically I suspect patient has recurrent pancreatitis vs gall bladder disease.  We had a shared decision making conversation about need of imaging initially.  Patient agreed to get blood work and receive symptomatic treatment medications initially.  If symptoms do not improve, patient may need repeat imaging to look for other abnormalities or necrosis.  Anticipate reassessment after work-up.  10:54 PM Laboratory testing shows evidence of recurrent pink otitis.  Patient elevated lipase and LFTs.  Ultrasound was performed to look for stone causing an obstructive pathology and there is no evidence of stones.  Lactic acid is elevated and improved after fluids.  Patient reports his pain is also improved.  Given patient's improved symptoms and inability to tolerate fluids in the ED, we feel patient is a candidate for a trial of outpatient therapy.  Patient will be a prescription for oxycodone without Tylenol for pain and Zofran for nausea control.  Patient advised that if the symptoms return or worsen and he cannot stay hydrated, he would likely need to come in and be admitted like he was last time for pancreatitis and dehydration that is refractory to outpatient medications.     Urinalysis did not show infection and patient otherwise appears well on reassessment.  Patient had no other questions or concerns and was discharged in good condition.   Final Clinical Impressions(s) / ED Diagnoses   Final diagnoses:  RUQ abdominal pain  Epigastric pain  Non-intractable vomiting with nausea, unspecified vomiting type  Acute pancreatitis, unspecified complication status, unspecified pancreatitis type    ED Discharge Orders         Ordered    oxyCODONE (ROXICODONE) 5 MG immediate release tablet  Every 4 hours PRN  02/03/18 2257    ondansetron (ZOFRAN) 4 MG tablet  Every 8 hours PRN     02/03/18 2257          Clinical Impression: 1. Epigastric pain   2. RUQ abdominal pain   3. Non-intractable vomiting with nausea, unspecified vomiting type   4. Acute pancreatitis, unspecified complication status, unspecified pancreatitis type     Disposition: Discharge  Condition: Good  I have discussed the results, Dx and Tx plan with the pt(& family if present). He/she/they expressed understanding and agree(s) with the plan. Discharge instructions discussed at great length. Strict return precautions discussed and pt &/or family have verbalized understanding of the instructions. No further questions at time of discharge.    New Prescriptions   ONDANSETRON (ZOFRAN) 4 MG TABLET    Take 1 tablet (4 mg total) by mouth every 8 (eight) hours as needed for nausea or vomiting.   OXYCODONE (ROXICODONE) 5 MG IMMEDIATE RELEASE TABLET    Take 1 tablet (5 mg total) by mouth every 4 (four) hours as needed for severe pain.    Follow Up: [facility] [address] [facility] [place] [address] [phone]     [facility] [address] [id number] mc [address] [place] [address] [phone]       Tegeler, Canary Brim, MD 02/03/18 2258

## 2018-02-03 NOTE — Discharge Instructions (Signed)
Your work-up today showed recurrent pancreatitis causing her symptoms.  Has your blood work was improved from last time and your ultrasound did not show stones, we feel you are safe for discharge home.  Since you are able to tolerate fluids in the ED, we feel you are safe for trial of outpatient management.  Please use the pain medicine and nausea medicine to help with your symptoms and maintain hydration.  If you get to or you cannot stay hydrated or control the pain, please return to the nearest emergency department for likely admission.

## 2018-02-03 NOTE — ED Triage Notes (Signed)
Pt complains of upper abdominal pain and nausea since this morning. Pt states pain feels like pancreatitis he had 2 months ago. Pt denies emesis or diarrhea.

## 2018-02-05 LAB — URINE CULTURE: Culture: NO GROWTH

## 2018-06-06 ENCOUNTER — Emergency Department (HOSPITAL_COMMUNITY)
Admission: EM | Admit: 2018-06-06 | Discharge: 2018-06-06 | Discharge status: Against Medical Advice | Payer: Non-veteran care

## 2018-06-06 ENCOUNTER — Emergency Department (HOSPITAL_COMMUNITY)
Admission: EM | Admit: 2018-06-06 | Discharge: 2018-06-07 | Disposition: A | Discharge status: Home / Self Care | Payer: Non-veteran care | Attending: Emergency Medicine | Admitting: Emergency Medicine

## 2018-06-06 ENCOUNTER — Other Ambulatory Visit: Payer: Self-pay

## 2018-06-06 ENCOUNTER — Encounter (HOSPITAL_COMMUNITY): Payer: Self-pay

## 2018-06-06 DIAGNOSIS — Z79899 Other long term (current) drug therapy: Secondary | ICD-10-CM | POA: Diagnosis not present

## 2018-06-06 DIAGNOSIS — F17210 Nicotine dependence, cigarettes, uncomplicated: Secondary | ICD-10-CM | POA: Insufficient documentation

## 2018-06-06 DIAGNOSIS — F4312 Post-traumatic stress disorder, chronic: Secondary | ICD-10-CM | POA: Insufficient documentation

## 2018-06-06 NOTE — ED Triage Notes (Signed)
Pt reports that he has PTSD and family is reporting abnormal behavior. Pt states that he slept for most of the last 48 hours. States that his medication is ready at the [place], but he wants to make sure that his electrolytes and blood counts are normal. Denies SI/HI. Ambulatory,

## 2018-06-06 NOTE — ED Notes (Signed)
Pt walked out of room prior to triage.

## 2018-06-06 NOTE — ED Notes (Signed)
Bed: [facility] Expected date:  Expected time:  Means of arrival:  Comments: 

## 2018-06-06 NOTE — ED Notes (Signed)
Pt stated that he was brought in by his wife for a mental health check. Pt stated he was not suicidal and the only drugs that he has done in marijuana.

## 2018-06-07 MED ORDER — TRAZODONE HCL 50 MG PO TABS
25.0000 mg | ORAL_TABLET | Freq: Once | ORAL | Status: AC
  Administered 2018-06-07: 25 mg via ORAL
  Filled 2018-06-07: qty 1

## 2018-06-07 NOTE — Discharge Instructions (Addendum)
Return to the ER for any new or worsening symptoms. Also for suicidal, homicidal ideation or acute psychosis.

## 2018-06-07 NOTE — ED Provider Notes (Signed)
[facility]-EMERGENCY DEPT Provider Note   CSN: [id number] Arrival date & time: 06/06/18  2311     History   Chief Complaint Chief Complaint  Patient presents with  . Medical Clearance    HPI Jeffrey Mathis is a 38 y.o. male who presents the emergency department for exacerbation of PTSD.  Patient is a previous Contractor.  I am familiar with the patient because he attended his wife in the emergency department last night who had a dog bite to the face.  The patient states that he thinks that it threw him into worsening of his PTSD after seeing the blood all over her.  He has been unable to sleep since last night.  His father-in-law who is at bedside and is an internist with the [facility] states that he was concerned and want to make sure he did not have an organic cause.  He has had some confusion predominantly after waking from sleep.  He is not using any alcohol.  He does occasionally smoke marijuana but uses no other drugs.  He denies suicidal or homicidal ideation.  He also denies any overt psychosis.  HPI  Past Medical History:  Diagnosis Date  . Pancreatitis   . PTSD (post-traumatic stress disorder)     Patient Active Problem List   Diagnosis Date Noted  . Acute pancreatitis 12/12/2017  . Leukocytosis 12/12/2017  . Hepatic steatosis 12/12/2017  . Transaminitis   . Pancreatitis 12/11/2017    Past Surgical History:  Procedure Laterality Date  . ABDOMINAL SURGERY     scrapnel  . APPENDECTOMY    . arm surgery    . KNEE ARTHROSCOPY          Home Medications    Prior to Admission medications   Medication Sig Start Date End Date Taking? Authorizing Provider  amoxicillin (AMOXIL) 500 MG capsule Take 500 mg by mouth 3 (three) times daily.  01/29/18   [provider]  ibuprofen (ADVIL,MOTRIN) 200 MG tablet Take 600 mg by mouth daily as needed for moderate pain.    [provider]  ibuprofen (ADVIL,MOTRIN) 800 MG tablet Take 800 mg by  mouth every 8 (eight) hours as needed for moderate pain.  01/29/18   [provider]  ondansetron (ZOFRAN) 4 MG tablet Take 1 tablet (4 mg total) by mouth every 8 (eight) hours as needed for nausea or vomiting. 02/03/18   Tegeler, Canary Brim, MD  oxyCODONE (ROXICODONE) 5 MG immediate release tablet Take 1 tablet (5 mg total) by mouth every 4 (four) hours as needed for severe pain. 02/03/18   Tegeler, Canary Brim, MD  pantoprazole (PROTONIX) 40 MG tablet Take 1 tablet (40 mg total) by mouth daily. 12/15/17   Rodolph Bong, MD    Family History Family History  Problem Relation Age of Onset  . Cancer Mother   . Bipolar disorder Mother   . Cancer Father   . Pancreatitis Father     Social History Social History   Tobacco Use  . Smoking status: Current Every Day Smoker    Packs/day: 0.50    Types: Cigarettes  . Smokeless tobacco: Never Used  Substance Use Topics  . Alcohol use: Not Currently  . Drug use: Yes    Types: Marijuana    Comment: daily     Allergies   Patient has no known allergies.   Review of Systems Review of Systems  Ten systems reviewed and are negative for acute change, except as noted in  the HPI.   Physical Exam Updated Vital Signs BP (!) 125/93 (BP Location: Left Arm)   Pulse 76   Temp 98.1 F (36.7 C) (Oral)   Resp 20   SpO2 98%   Physical Exam Vitals signs and nursing note reviewed.  Constitutional:      General: He is not in acute distress.    Appearance: He is well-developed. He is not diaphoretic.  HENT:     Head: Normocephalic and atraumatic.  Eyes:     General: No scleral icterus.    Conjunctiva/sclera: Conjunctivae normal.  Neck:     Musculoskeletal: Normal range of motion and neck supple.  Cardiovascular:     Rate and Rhythm: Normal rate and regular rhythm.     Heart sounds: Normal heart sounds.  Pulmonary:     Effort: Pulmonary effort is normal. No respiratory distress.     Breath sounds: Normal breath sounds.    Abdominal:     Palpations: Abdomen is soft.     Tenderness: There is no abdominal tenderness.  Skin:    General: Skin is warm and dry.     Capillary Refill: Capillary refill takes less than 2 seconds.  Neurological:     Mental Status: He is alert.  Psychiatric:        Behavior: Behavior normal.      ED Treatments / Results  Labs (all labs ordered are listed, but only abnormal results are displayed) Labs Reviewed - No data to display  EKG None  Radiology No results found.  Procedures Procedures (including critical care time)  Medications Ordered in ED Medications  traZODone (DESYREL) tablet 25 mg (25 mg Oral Given 06/07/18 0026)     Initial Impression / Assessment and Plan / ED Course  I have reviewed the triage vital signs and the nursing notes.  Pertinent labs & imaging results that were available during my care of the patient were reviewed by me and considered in my medical decision making (see chart for details).     Patient is not suicidal or homicidal.  He is not overtly psychotic.  Shared decision making we decided to give the patient a dose of trazodone tonight.  He may follow-up at the [facility] clinic for full blood work-up on Monday.  He does not seem to meet any inpatient psychiatric criteria at this time.  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

## 2020-05-09 ENCOUNTER — Ambulatory Visit (INDEPENDENT_AMBULATORY_CARE_PROVIDER_SITE_OTHER): Payer: 59 | Admitting: Psychologist

## 2020-05-09 DIAGNOSIS — F331 Major depressive disorder, recurrent, moderate: Secondary | ICD-10-CM | POA: Diagnosis not present

## 2020-05-09 DIAGNOSIS — F4310 Post-traumatic stress disorder, unspecified: Secondary | ICD-10-CM

## 2020-05-24 ENCOUNTER — Ambulatory Visit: Payer: 59 | Admitting: Psychologist

## 2020-06-20 ENCOUNTER — Ambulatory Visit (INDEPENDENT_AMBULATORY_CARE_PROVIDER_SITE_OTHER): Payer: 59 | Admitting: Psychologist

## 2020-06-20 DIAGNOSIS — F331 Major depressive disorder, recurrent, moderate: Secondary | ICD-10-CM

## 2020-06-20 DIAGNOSIS — F4310 Post-traumatic stress disorder, unspecified: Secondary | ICD-10-CM | POA: Diagnosis not present

## 2020-07-04 ENCOUNTER — Ambulatory Visit: Payer: 59 | Admitting: Psychologist

## 2020-07-06 ENCOUNTER — Ambulatory Visit: Payer: 59 | Admitting: Psychologist

## 2020-12-14 ENCOUNTER — Ambulatory Visit: Payer: BC Managed Care – PPO | Admitting: Internal Medicine

## 2020-12-14 ENCOUNTER — Encounter: Payer: Self-pay | Admitting: Internal Medicine

## 2020-12-14 ENCOUNTER — Other Ambulatory Visit: Payer: Self-pay

## 2020-12-14 VITALS — BP 122/84 | HR 84 | Temp 98.8°F | Resp 18 | Ht 73.0 in | Wt 160.4 lb

## 2020-12-14 DIAGNOSIS — F1021 Alcohol dependence, in remission: Secondary | ICD-10-CM

## 2020-12-14 DIAGNOSIS — Z720 Tobacco use: Secondary | ICD-10-CM | POA: Diagnosis not present

## 2020-12-14 DIAGNOSIS — Z0001 Encounter for general adult medical examination with abnormal findings: Secondary | ICD-10-CM | POA: Diagnosis not present

## 2020-12-14 NOTE — Progress Notes (Signed)
   Subjective:   Patient ID: Jeffrey Mathis, male    DOB: 07-Jun-1980, 40 y.o.   MRN: [id number]  HPI The patient is a new 40 YO man coming in for physical. Prior alcohol use sober 1.5 years and trying to cut back/quit smoking.   PMH, [facility], social history reviewed and updated  Review of Systems  Constitutional: Negative.   HENT: Negative.    Eyes: Negative.   Respiratory:  Negative for cough, chest tightness and shortness of breath.   Cardiovascular:  Negative for chest pain, palpitations and leg swelling.  Gastrointestinal:  Negative for abdominal distention, abdominal pain, constipation, diarrhea, nausea and vomiting.  Musculoskeletal: Negative.   Skin: Negative.   Neurological: Negative.   Psychiatric/Behavioral: Negative.     Objective:  Physical Exam Constitutional:      Appearance: He is well-developed.  HENT:     Head: Normocephalic and atraumatic.  Cardiovascular:     Rate and Rhythm: Normal rate and regular rhythm.  Pulmonary:     Effort: Pulmonary effort is normal. No respiratory distress.     Breath sounds: Normal breath sounds. No wheezing or rales.  Abdominal:     General: Bowel sounds are normal. There is no distension.     Palpations: Abdomen is soft.     Tenderness: There is no abdominal tenderness. There is no rebound.  Musculoskeletal:     Cervical back: Normal range of motion.  Skin:    General: Skin is warm and dry.  Neurological:     Mental Status: He is alert and oriented to person, place, and time.     Coordination: Coordination normal.    Vitals:   12/14/20 0954  BP: 122/84  Pulse: 84  Resp: 18  Temp: 98.8 F (37.1 C)  TempSrc: Oral  SpO2: 98%  Weight: 160 lb 6.4 oz (72.8 kg)  Height: 6\' 1"  (1.854 m)    This visit occurred during the SARS-CoV-2 public health emergency.  Safety protocols were in place, including screening questions prior to the visit, additional usage of staff PPE, and extensive cleaning of exam room while observing  appropriate contact time as indicated for disinfecting solutions.   Assessment & Plan:

## 2020-12-15 DIAGNOSIS — F1021 Alcohol dependence, in remission: Secondary | ICD-10-CM | POA: Insufficient documentation

## 2020-12-15 DIAGNOSIS — Z720 Tobacco use: Secondary | ICD-10-CM | POA: Insufficient documentation

## 2020-12-15 DIAGNOSIS — Z0001 Encounter for general adult medical examination with abnormal findings: Secondary | ICD-10-CM | POA: Insufficient documentation

## 2020-12-15 NOTE — Assessment & Plan Note (Signed)
Flu shot yearly. Covid-19 up to date. Tetanus up to date. Counseled about sun safety and mole surveillance. Counseled about the dangers of distracted driving. Given 10 year screening recommendations.

## 2020-12-15 NOTE — Assessment & Plan Note (Signed)
Counseled to quit and he is working on cutting back. Does not wish to try medication although has had success with wellbutrin in the past.

## 2020-12-15 NOTE — Assessment & Plan Note (Signed)
Sober 1.5 years and encouraged to continue. [facility] will [facility] [place] [facility].

## 2021-03-24 ENCOUNTER — Ambulatory Visit: Payer: Non-veteran care | Admitting: Internal Medicine

## 2021-05-17 ENCOUNTER — Other Ambulatory Visit: Payer: Self-pay

## 2021-05-17 ENCOUNTER — Encounter (HOSPITAL_COMMUNITY): Payer: Self-pay

## 2021-05-17 ENCOUNTER — Inpatient Hospital Stay (HOSPITAL_COMMUNITY)
Admission: EM | Admit: 2021-05-17 | Discharge: 2021-05-20 | DRG: 440 | Disposition: A | Discharge status: Home / Self Care | Payer: No Typology Code available for payment source | Attending: Internal Medicine | Admitting: Internal Medicine

## 2021-05-17 ENCOUNTER — Emergency Department (HOSPITAL_COMMUNITY): Payer: No Typology Code available for payment source

## 2021-05-17 DIAGNOSIS — R1010 Upper abdominal pain, unspecified: Secondary | ICD-10-CM

## 2021-05-17 DIAGNOSIS — Z79890 Hormone replacement therapy (postmenopausal): Secondary | ICD-10-CM

## 2021-05-17 DIAGNOSIS — Z818 Family history of other mental and behavioral disorders: Secondary | ICD-10-CM

## 2021-05-17 DIAGNOSIS — Z79899 Other long term (current) drug therapy: Secondary | ICD-10-CM

## 2021-05-17 DIAGNOSIS — K859 Acute pancreatitis without necrosis or infection, unspecified: Secondary | ICD-10-CM | POA: Diagnosis not present

## 2021-05-17 DIAGNOSIS — F1021 Alcohol dependence, in remission: Secondary | ICD-10-CM | POA: Diagnosis present

## 2021-05-17 DIAGNOSIS — R001 Bradycardia, unspecified: Secondary | ICD-10-CM | POA: Diagnosis not present

## 2021-05-17 DIAGNOSIS — F4310 Post-traumatic stress disorder, unspecified: Secondary | ICD-10-CM | POA: Diagnosis present

## 2021-05-17 DIAGNOSIS — Z720 Tobacco use: Secondary | ICD-10-CM | POA: Diagnosis present

## 2021-05-17 DIAGNOSIS — K760 Fatty (change of) liver, not elsewhere classified: Secondary | ICD-10-CM | POA: Diagnosis not present

## 2021-05-17 DIAGNOSIS — F17210 Nicotine dependence, cigarettes, uncomplicated: Secondary | ICD-10-CM | POA: Diagnosis present

## 2021-05-17 DIAGNOSIS — Z20822 Contact with and (suspected) exposure to covid-19: Secondary | ICD-10-CM | POA: Diagnosis present

## 2021-05-17 LAB — URINALYSIS, ROUTINE W REFLEX MICROSCOPIC
Bacteria, UA: NONE SEEN
Bilirubin Urine: NEGATIVE
Glucose, UA: NEGATIVE mg/dL
Hgb urine dipstick: NEGATIVE
Ketones, ur: 80 mg/dL — AB
Leukocytes,Ua: NEGATIVE
Nitrite: NEGATIVE
Protein, ur: 30 mg/dL — AB
Specific Gravity, Urine: 1.033 — ABNORMAL HIGH (ref 1.005–1.030)
pH: 6 (ref 5.0–8.0)

## 2021-05-17 LAB — CBC WITH DIFFERENTIAL/PLATELET
Abs Immature Granulocytes: 0.04 10*3/uL (ref 0.00–0.07)
Basophils Absolute: 0.1 10*3/uL (ref 0.0–0.1)
Basophils Relative: 1 %
Eosinophils Absolute: 0.1 10*3/uL (ref 0.0–0.5)
Eosinophils Relative: 1 %
HCT: 46.5 % (ref 39.0–52.0)
Hemoglobin: 16 g/dL (ref 13.0–17.0)
Immature Granulocytes: 0 %
Lymphocytes Relative: 19 %
Lymphs Abs: 1.8 10*3/uL (ref 0.7–4.0)
MCH: 34.2 pg — ABNORMAL HIGH (ref 26.0–34.0)
MCHC: 34.4 g/dL (ref 30.0–36.0)
MCV: 99.4 fL (ref 80.0–100.0)
Monocytes Absolute: 1 10*3/uL (ref 0.1–1.0)
Monocytes Relative: 10 %
Neutro Abs: 6.8 10*3/uL (ref 1.7–7.7)
Neutrophils Relative %: 69 %
Platelets: 208 10*3/uL (ref 150–400)
RBC: 4.68 MIL/uL (ref 4.22–5.81)
RDW: 12.4 % (ref 11.5–15.5)
WBC: 9.8 10*3/uL (ref 4.0–10.5)
nRBC: 0 % (ref 0.0–0.2)

## 2021-05-17 LAB — COMPREHENSIVE METABOLIC PANEL
ALT: 225 U/L — ABNORMAL HIGH (ref 0–44)
AST: 89 U/L — ABNORMAL HIGH (ref 15–41)
Albumin: 4.6 g/dL (ref 3.5–5.0)
Alkaline Phosphatase: 83 U/L (ref 38–126)
Anion gap: 10 (ref 5–15)
BUN: 10 mg/dL (ref 6–20)
CO2: 26 mmol/L (ref 22–32)
Calcium: 9.7 mg/dL (ref 8.9–10.3)
Chloride: 99 mmol/L (ref 98–111)
Creatinine, Ser: 0.8 mg/dL (ref 0.61–1.24)
GFR, Estimated: >60 mL/min (ref >60)
Glucose, Bld: 95 mg/dL (ref 70–99)
Potassium: 3.7 mmol/L (ref 3.5–5.1)
Sodium: 135 mmol/L (ref 135–145)
Total Bilirubin: 1.8 mg/dL — ABNORMAL HIGH (ref 0.3–1.2)
Total Protein: 7.5 g/dL (ref 6.5–8.1)

## 2021-05-17 LAB — LIPASE, BLOOD: Lipase: 413 U/L — ABNORMAL HIGH (ref 11–51)

## 2021-05-17 LAB — RESP PANEL BY RT-PCR (FLU A&B, COVID) ARPGX2
Influenza A by PCR: NEGATIVE
Influenza B by PCR: NEGATIVE
SARS Coronavirus 2 by RT PCR: NEGATIVE

## 2021-05-17 MED ORDER — IOHEXOL 350 MG/ML SOLN
80.0000 mL | Freq: Once | INTRAVENOUS | Status: AC | PRN
  Administered 2021-05-17: 07:00:00 80 mL via INTRAVENOUS

## 2021-05-17 MED ORDER — HYDROMORPHONE HCL 1 MG/ML IJ SOLN
1.0000 mg | Freq: Once | INTRAMUSCULAR | Status: AC
  Administered 2021-05-17: 09:00:00 1 mg via INTRAVENOUS
  Filled 2021-05-17: qty 1

## 2021-05-17 MED ORDER — ONDANSETRON HCL 4 MG/2ML IJ SOLN
4.0000 mg | Freq: Four times a day (QID) | INTRAMUSCULAR | Status: DC | PRN
  Administered 2021-05-17 – 2021-05-18 (×4): 4 mg via INTRAVENOUS
  Filled 2021-05-17 (×4): qty 2

## 2021-05-17 MED ORDER — ACETAMINOPHEN 325 MG PO TABS
650.0000 mg | ORAL_TABLET | Freq: Four times a day (QID) | ORAL | Status: DC | PRN
  Administered 2021-05-17 (×2): 650 mg via ORAL
  Filled 2021-05-17 (×2): qty 2

## 2021-05-17 MED ORDER — HYDROMORPHONE HCL 1 MG/ML IJ SOLN
1.0000 mg | INTRAMUSCULAR | Status: DC | PRN
  Administered 2021-05-17 – 2021-05-19 (×11): 1 mg via INTRAVENOUS
  Filled 2021-05-17 (×11): qty 1

## 2021-05-17 MED ORDER — ACETAMINOPHEN 650 MG RE SUPP: 650.0000 mg | Freq: Four times a day (QID) | RECTAL | Status: DC | PRN

## 2021-05-17 MED ORDER — PANTOPRAZOLE SODIUM 40 MG IV SOLR
40.0000 mg | INTRAVENOUS | Status: DC
  Administered 2021-05-17: 11:00:00 40 mg via INTRAVENOUS
  Filled 2021-05-17: qty 40

## 2021-05-17 MED ORDER — ONDANSETRON HCL 4 MG PO TABS: 4.0000 mg | ORAL_TABLET | Freq: Four times a day (QID) | ORAL | Status: DC | PRN

## 2021-05-17 MED ORDER — LACTATED RINGERS IV BOLUS
1000.0000 mL | Freq: Once | INTRAVENOUS | Status: AC
  Administered 2021-05-17: 07:00:00 1000 mL via INTRAVENOUS

## 2021-05-17 MED ORDER — LACTATED RINGERS IV BOLUS
2000.0000 mL | Freq: Once | INTRAVENOUS | Status: AC
  Administered 2021-05-17: 11:00:00 2000 mL via INTRAVENOUS

## 2021-05-17 MED ORDER — ONDANSETRON HCL 4 MG/2ML IJ SOLN
4.0000 mg | Freq: Once | INTRAMUSCULAR | Status: AC
  Administered 2021-05-17: 07:00:00 4 mg via INTRAVENOUS
  Filled 2021-05-17: qty 2

## 2021-05-17 MED ORDER — LACTATED RINGERS IV SOLN: INTRAVENOUS | Status: DC

## 2021-05-17 MED ORDER — SODIUM CHLORIDE (PF) 0.9 % IJ SOLN
INTRAMUSCULAR | Status: AC
  Filled 2021-05-17: qty 50

## 2021-05-17 MED ORDER — HYDROMORPHONE HCL 1 MG/ML IJ SOLN
1.0000 mg | Freq: Once | INTRAMUSCULAR | Status: AC
  Administered 2021-05-17: 07:00:00 1 mg via INTRAVENOUS
  Filled 2021-05-17: qty 1

## 2021-05-17 NOTE — ED Triage Notes (Signed)
Pt reports with abdominal pain and states that it feels like it's pancreatitis again. Pt states that the pain radiates to his mid back.

## 2021-05-17 NOTE — H&P (Addendum)
History and Physical    Jeffrey Mathis [id number] DOB: 1981-05-03 DOA: 05/17/2021  PCP: Hoyt Koch, MD   Patient coming from: Home.   I have personally briefly reviewed patient's old medical records in [place]  Chief Complaint: Abdominal pain.  HPI: Jeffrey Mathis is a 40 y.o. male with medical history significant of previous pancreatitis episodes, alcohol abuse for 7 years in remission for the past 5 months, PTSD, tobacco abuse who is coming to the emergency department with a 2-day history of progressively worse epigastric abdominal pain radiated to his back with persistent nausea, poor oral intake and 2 episodes of emesis.  He also stated on review of systems his urine output has been decreased in the past 2 days and his urine looks very dark amber.  No melena or hematochezia.  No flank pain, dysuria, frequency or hematuria.  He denied fever, rhinorrhea, sore throat, wheezing or hemoptysis.  He has had some nonproductive coughing episodes recently as well.  Denied chest pain, palpitations, diaphoresis, PND, orthopnea or pitting edema of the lower extremities.  He has been getting occasionally lightheaded.  No polyuria, polydipsia, polyphagia or blurred vision.  ED Course: Initial vital signs were temperature 97.7 F, pulse 52, respirations 16, blood pressure 135/84 mmHg and O2 sat 100% on room air.  The patient received 1000 mL of LR bolus, hydromorphone 1 mg IVP x1 and ondansetron 4 mg IVP x1.  Lab work: His urinalysis showed increase of specific gravity, ketonuria 30 and proteinuria 30 mg/dL.  The rest of the UA was unremarkable.  CBC showed a white count of 9.8 with 69% neutrophils, hemoglobin 16.0 g/dL and platelets 290.  Chemistries/CMP showed an AST of 89, ALT of 225 and lipase of 413 units/L.  Bilirubin was 1.8 mg/dL.  The rest of the CMP was unremarkable.  Imaging: CT abdomen/pelvis with contrast showed an acute edematous pancreatitis with collection.  There  was hepatic asteatosis.  Please see images and full radiology report for further details.  Review of Systems: As per HPI otherwise all other systems reviewed and are negative.  Past Medical History:  Diagnosis Date   Pancreatitis    PTSD (post-traumatic stress disorder)    Past Surgical History:  Procedure Laterality Date   ABDOMINAL SURGERY     scrapnel   APPENDECTOMY     arm surgery     KNEE ARTHROSCOPY     Social History  reports that he has been smoking cigarettes. He has been smoking an average of .5 packs per day. He has never used smokeless tobacco. He reports that he does not currently use alcohol. He reports current drug use. Drug: Marijuana.  No Known Allergies  Family History  Problem Relation Age of Onset   Cancer Mother    Bipolar disorder Mother    Cancer Father    Pancreatitis Father    Prior to Admission medications   Medication Sig Start Date End Date Taking? Authorizing Provider  acetaminophen (TYLENOL) 325 MG tablet Take 650 mg by mouth every 6 (six) hours as needed for mild pain, fever or headache.   Yes [provider]  famotidine (PEPCID) 20 MG tablet Take 20 mg by mouth 2 (two) times daily as needed for heartburn or indigestion.   Yes [provider]  ibuprofen (ADVIL,MOTRIN) 200 MG tablet Take 400 mg by mouth daily as needed for moderate pain.   Yes [provider]   Physical Exam: Vitals:   05/17/21 [id number] 05/17/21 0636 05/17/21 0740  BP: 135/84 136/90 (!) 145/84  Pulse: (!) 52 (!) 45 (!) 44  Resp: 16 19 16   Temp: 97.7 F (36.5 C)    TempSrc: Oral    SpO2: 100% 99% 100%  Weight: 72.6 kg    Height: 6' (1.829 m)     Constitutional: NAD, calm, comfortable Eyes: PERRL, lids and conjunctivae normal.  No icterus. ENMT: Mucous membranes are mildly dry.  Posterior pharynx clear of any exudate or lesions. Neck: normal, supple, no masses, no thyromegaly Respiratory: clear to auscultation bilaterally, no wheezing, no crackles.  Normal respiratory effort. No accessory muscle use.  Cardiovascular: Regular rate and rhythm, no murmurs / rubs / gallops. No extremity edema. 2+ pedal pulses. No carotid bruits.  Abdomen: No distention.  Bowel sounds positive.  Soft, positive epigastric tenderness, no guarding or rebound, no masses palpated. No hepatosplenomegaly. Musculoskeletal: no clubbing / cyanosis. Good ROM, no contractures. Normal muscle tone.  Skin: no rashes, lesions, ulcers on very limited dermatological examination. Neurologic: CN 2-12 grossly intact. Sensation intact, DTR normal. Strength 5/5 in all 4.  Psychiatric: Normal judgment and insight. Alert and oriented x 3. Normal mood.   Labs on Admission: I have personally reviewed following labs and imaging studies  CBC: Recent Labs  Lab 05/17/21 0445  WBC 9.8  NEUTROABS 6.8  HGB 16.0  HCT 46.5  MCV 99.4  PLT 208    Basic Metabolic Panel: Recent Labs  Lab 05/17/21 0445  NA 135  K 3.7  CL 99  CO2 26  GLUCOSE 95  BUN 10  CREATININE 0.80  CALCIUM 9.7   GFR: Estimated Creatinine Clearance: 128.6 mL/min (by C-G formula based on SCr of 0.8 mg/dL).  Liver Function Tests: Recent Labs  Lab 05/17/21 0445  AST 89*  ALT 225*  ALKPHOS 83  BILITOT 1.8*  PROT 7.5  ALBUMIN 4.6   Urine analysis:    Component Value Date/Time   COLORURINE AMBER (A) 05/17/2021 0448   APPEARANCEUR HAZY (A) 05/17/2021 0448   LABSPEC 1.033 (H) 05/17/2021 0448   PHURINE 6.0 05/17/2021 0448   GLUCOSEU NEGATIVE 05/17/2021 0448   HGBUR NEGATIVE 05/17/2021 0448   BILIRUBINUR NEGATIVE 05/17/2021 0448   KETONESUR 80 (A) 05/17/2021 0448   PROTEINUR 30 (A) 05/17/2021 0448   NITRITE NEGATIVE 05/17/2021 0448   LEUKOCYTESUR NEGATIVE 05/17/2021 0448   Radiological Exams on Admission: CT ABDOMEN PELVIS W CONTRAST  Result Date: 05/17/2021 CLINICAL DATA:  Left upper quadrant pain EXAM: CT ABDOMEN AND PELVIS WITH CONTRAST TECHNIQUE: Multidetector CT imaging of the abdomen and  pelvis was performed using the standard protocol following bolus administration of intravenous contrast. CONTRAST:  34mL OMNIPAQUE IOHEXOL 350 MG/ML SOLN COMPARISON:  12/11/2017 FINDINGS: Lower chest:  No contributory findings. Hepatobiliary: Hepatic steatosis.No evidence of biliary obstruction or stone. Pancreas: Peripancreatic edema and pancreatic expansion especially affecting the head and tail. No necrosis or collection. Spleen: Unremarkable. Adrenals/Urinary Tract: Negative adrenals. No hydronephrosis or stone. Unremarkable bladder. Stomach/Bowel: No obstruction. Submucosal fat in the colon, a non-specific finding. No active bowel inflammation. Appendectomy. Vascular/Lymphatic: No acute vascular abnormality. No mass or adenopathy. Reproductive:No pathologic findings. Other: No ascites or pneumoperitoneum. Musculoskeletal: No acute abnormalities. IMPRESSION: Acute edematous pancreatitis without collection. Hepatic steatosis. Electronically Signed   By: 02/11/2018 M.D.   On: 05/17/2021 08:03    EKG: Independently reviewed.   Assessment/Plan Principal Problem:   Acute pancreatitis Observation/telemetry. Keep NPO. Continue IV fluids. Continue analgesics as needed. Continue antiemetics as needed. Pantoprazole 40 mg IVP every 24 hours. Follow-up  CMP, CBC and lipase.  Active Problems:   Sinus bradycardia Pain medication? Increase vagal tone with GI symptoms? Has had some recent lightheadedness. Continue cardio monitoring for at least 24 hours.    Hepatic steatosis Advised about risk of cirrhosis. Avoid EtOH use. Avoid hepatotoxins.    Alcohol dependence in remission [facility]) Continue current remission stay. Follow-up with behavioral health if needed.    Tobacco abuse He has had a recent dry cough. No nicotine withdrawal symptoms at this time. Declined nicotine replacement therapy. Advised to cease smoking.    DVT prophylaxis: SCDs. Code Status:   Full code. Family  Communication:  Disposition Plan:   Patient is from:  Home.  Anticipated DC to:  Home.  Anticipated DC date:  05/19/2019 to 05/19/2021.  Anticipated DC barriers: Clinical status. Consults called:   Admission status:  Observation/telemetry.   Severity of Illness: High severity after presenting with abdominal pain, poor oral intake, nausea and emesis in the setting of acute pancreatitis.  Reubin Milan MD [facility]  How to contact the [facility] Attending or Consulting provider [place] or covering provider during after hours [facility], for this patient?   Check the care team in [facility] and look for a) attending/consulting [facility] provider listed and b) the [facility] team listed Log into [email or web address] and use [facility] universal password to access. If you do not have the password, please contact the hospital operator. Locate the [facility] provider you are looking for under [facility] and page to a number that you can be directly reached. If you still have difficulty reaching the provider, please page the [facility] (Director on Call) for the Hospitalists listed on amion for assistance.  05/17/2021, 10:31 AM   This document was prepared using Dragon voice recognition software and may contain some unintended transcription errors.

## 2021-05-17 NOTE — ED Provider Notes (Signed)
[facility]-EMERGENCY DEPT Provider Note   CSN: [id number] Arrival date & time: 05/17/21  0340     History Chief Complaint  Patient presents with   Abdominal Pain    Jeffrey Mathis is a 40 y.o. male.  HPI Patient is a 40 year old male with a history of pancreatitis, alcohol dependence in remission, hepatic steatosis, who presents to the emergency department due to abdominal pain.  Patient states his symptoms began about 2 days ago.  States his abdominal pain has been progressively worsening.  States his pain is along the upper abdomen and radiating laterally around his torso towards his back.  Reports associated nausea, vomiting, and diarrhea.  Denies any hematemesis.  Reports a history of pancreatitis and states his current abdominal pain is similar but worse than his prior pancreatic exacerbation.  States that he stopped drinking alcohol about 7 months ago.    Past Medical History:  Diagnosis Date   Pancreatitis    PTSD (post-traumatic stress disorder)     Patient Active Problem List   Diagnosis Date Noted   Alcohol dependence in remission (HCC) 12/15/2020   Tobacco abuse 12/15/2020   Encounter for general adult medical examination with abnormal findings 12/15/2020   Hepatic steatosis 12/12/2017    Past Surgical History:  Procedure Laterality Date   ABDOMINAL SURGERY     scrapnel   APPENDECTOMY     arm surgery     KNEE ARTHROSCOPY         Family History  Problem Relation Age of Onset   Cancer Mother    Bipolar disorder Mother    Cancer Father    Pancreatitis Father     Social History   Tobacco Use   Smoking status: Every Day    Packs/day: 0.50    Types: Cigarettes   Smokeless tobacco: Never  Vaping Use   Vaping Use: Never used  Substance Use Topics   Alcohol use: Not Currently   Drug use: Yes    Types: Marijuana    Comment: daily    Home Medications Prior to Admission medications   Medication Sig Start Date End Date  Taking? Authorizing Provider  ibuprofen (ADVIL,MOTRIN) 200 MG tablet Take 400 mg by mouth daily as needed for moderate pain.    [provider]  MELATONIN PO Take 1 tablet by mouth at bedtime as needed.    [provider]  pantoprazole (PROTONIX) 40 MG tablet Take 1 tablet (40 mg total) by mouth daily. 12/15/17   Rodolph Bong, MD  VITAMIN D PO Take 1 tablet by mouth daily.    [provider]    Allergies    Patient has no known allergies.  Review of Systems   Review of Systems  All other systems reviewed and are negative. Ten systems reviewed and are negative for acute change, except as noted in the HPI.   Physical Exam Updated Vital Signs BP 135/84 (BP Location: Right Arm)    Pulse (!) 52    Temp 97.7 F (36.5 C) (Oral)    Resp 16    Ht 6' (1.829 m)    Wt 72.6 kg    SpO2 100%    BMI 21.70 kg/m   Physical Exam Vitals and nursing note reviewed.  Constitutional:      General: He is not in acute distress.    Appearance: Normal appearance. He is well-developed. He is not ill-appearing, toxic-appearing or diaphoretic.  HENT:     Head: Normocephalic and atraumatic.  Right Ear: External ear normal.     Left Ear: External ear normal.     Nose: Nose normal.     Mouth/Throat:     Mouth: Mucous membranes are moist.     Pharynx: Oropharynx is clear. No oropharyngeal exudate or posterior oropharyngeal erythema.  Eyes:     Extraocular Movements: Extraocular movements intact.  Cardiovascular:     Rate and Rhythm: Normal rate and regular rhythm.     Pulses: Normal pulses.     Heart sounds: Normal heart sounds. No murmur heard.   No friction rub. No gallop.  Pulmonary:     Effort: Pulmonary effort is normal. No respiratory distress.     Breath sounds: Normal breath sounds. No stridor. No wheezing, rhonchi or rales.  Abdominal:     General: Abdomen is flat.     Tenderness: There is abdominal tenderness in the epigastric area.     Comments: Abdomen is  flat and soft.  Moderate tenderness noted along the epigastrium with mild tenderness along the left upper quadrant.  Musculoskeletal:        General: Normal range of motion.     Cervical back: Normal range of motion and neck supple. No tenderness.  Skin:    General: Skin is warm and dry.  Neurological:     General: No focal deficit present.     Mental Status: He is alert and oriented to person, place, and time.  Psychiatric:        Mood and Affect: Mood normal.        Behavior: Behavior normal.   ED Results / Procedures / Treatments   Labs (all labs ordered are listed, but only abnormal results are displayed) Labs Reviewed  COMPREHENSIVE METABOLIC PANEL - Abnormal; Notable for the following components:      Result Value   AST 89 (*)    ALT 225 (*)    Total Bilirubin 1.8 (*)    All other components within normal limits  CBC WITH DIFFERENTIAL/PLATELET - Abnormal; Notable for the following components:   MCH 34.2 (*)    All other components within normal limits  URINALYSIS, ROUTINE W REFLEX MICROSCOPIC - Abnormal; Notable for the following components:   Color, Urine AMBER (*)    APPearance HAZY (*)    Specific Gravity, Urine 1.033 (*)    Ketones, ur 80 (*)    Protein, ur 30 (*)    All other components within normal limits  LIPASE, BLOOD   EKG None  Radiology No results found.  Procedures Procedures   Medications Ordered in ED Medications  lactated ringers bolus 1,000 mL (1,000 mLs Intravenous New Bag/Given 05/17/21 0634)  HYDROmorphone (DILAUDID) injection 1 mg (1 mg Intravenous Given 05/17/21 0630)  ondansetron (ZOFRAN) injection 4 mg (4 mg Intravenous Given 05/17/21 0630)    ED Course  I have reviewed the triage vital signs and the nursing notes.  Pertinent labs & imaging results that were available during my care of the patient were reviewed by me and considered in my medical decision making (see chart for details).    MDM Rules/Calculators/A&P                           Pt is a 40 y.o. male with history of pancreatitis as well as alcohol abuse in remission who presents to the emergency department with upper abdominal pain, nausea, vomiting, diarrhea.  Labs: CBC with an MCH of 34.2. CMP with an AST  of 89, ALT of 225, total bilirubin of 1.8. UA with an elevated specific gravity, 80 ketones, 30 protein. Lipase is pending.  I, Placido Sou, PA-C, personally reviewed and evaluated these images and lab results as part of my medical decision-making.  Exam concerning for possible pancreatitis.  Patient with a history of the same.  CBC without leukocytosis.  CMP with an elevated AST of 89 and ALT of 225.  Patient reports alcohol cessation for about 7 months.  LFTs not consistent with alcohol abuse.  Patient does have a history of hepatic steatosis.  It is the end of my shift and patient care is being transferred to [facility].  Patient is being started on Dilaudid, Zofran, as well as IV fluids.  Lipase is pending.  Disposition pending based on results of patient's lipase as well as his response to treatment.  Note: Portions of this report may have been transcribed using voice recognition software. Every effort was made to ensure accuracy; however, inadvertent computerized transcription errors may be present.   Final Clinical Impression(s) / ED Diagnoses Final diagnoses:  Pain of upper abdomen    Rx / DC Orders ED Discharge Orders     None        Placido Sou, PA-C 05/17/21 1610    Paula Libra, MD 05/17/21 [phone]

## 2021-05-17 NOTE — ED Provider Notes (Signed)
Received signout from previous provider, please see his note for complete H&P.  This is a 40 year old male significant history of alcohol abuse, alcohol-related pancreatitis, hepatic steatosis who presents with complaints of abdominal pain.  Pain is is involving his upper abdomen, ongoing for the past 2 days, felt similar to prior pancreatitis.  Report that he stopped drinking alcohol approximately 7 months ago.  Today labs remarkable for transaminitis with AST 89, ALT 225 and total bili of 1.8.  Elevated lipase of 413.  Normal WBC and normal H&H.  Patient has reproducible upper abdominal tenderness on exam, will obtain abdominal pelvis CT scan for further assessment.  Patient is currently receiving medication to help with the symptoms.  8:48 AM Appreciate consultation from [facility] Dr. Hessie Knows who agrees to see and will admit pt for further management of his acute pancreatitis.    BP (!) 145/84    Pulse (!) 44    Temp 97.7 F (36.5 C) (Oral)    Resp 16    Ht 6' (1.829 m)    Wt 72.6 kg    SpO2 100%    BMI 21.70 kg/m   Results for orders placed or performed during the hospital encounter of 05/17/21  Comprehensive metabolic panel  Result Value Ref Range   Sodium 135 135 - 145 mmol/L   Potassium 3.7 3.5 - 5.1 mmol/L   Chloride 99 98 - 111 mmol/L   CO2 26 22 - 32 mmol/L   Glucose, Bld 95 70 - 99 mg/dL   BUN 10 6 - 20 mg/dL   Creatinine, Ser 4.23 0.61 - 1.24 mg/dL   Calcium 9.7 8.9 - 53.6 mg/dL   Total Protein 7.5 6.5 - 8.1 g/dL   Albumin 4.6 3.5 - 5.0 g/dL   AST 89 (H) 15 - 41 U/L   ALT 225 (H) 0 - 44 U/L   Alkaline Phosphatase 83 38 - 126 U/L   Total Bilirubin 1.8 (H) 0.3 - 1.2 mg/dL   GFR, Estimated >14 >43 mL/min   Anion gap 10 5 - 15  Lipase, blood  Result Value Ref Range   Lipase 413 (H) 11 - 51 U/L  CBC with Diff  Result Value Ref Range   WBC 9.8 4.0 - 10.5 K/uL   RBC 4.68 4.22 - 5.81 MIL/uL   Hemoglobin 16.0 13.0 - 17.0 g/dL   HCT 15.4 00.8 - 67.6 %   MCV 99.4 80.0 -  100.0 fL   MCH 34.2 (H) 26.0 - 34.0 pg   MCHC 34.4 30.0 - 36.0 g/dL   RDW 19.5 09.3 - 26.7 %   Platelets 208 150 - 400 K/uL   nRBC 0.0 0.0 - 0.2 %   Neutrophils Relative % 69 %   Neutro Abs 6.8 1.7 - 7.7 K/uL   Lymphocytes Relative 19 %   Lymphs Abs 1.8 0.7 - 4.0 K/uL   Monocytes Relative 10 %   Monocytes Absolute 1.0 0.1 - 1.0 K/uL   Eosinophils Relative 1 %   Eosinophils Absolute 0.1 0.0 - 0.5 K/uL   Basophils Relative 1 %   Basophils Absolute 0.1 0.0 - 0.1 K/uL   Immature Granulocytes 0 %   Abs Immature Granulocytes 0.04 0.00 - 0.07 K/uL  Urinalysis, Routine w reflex microscopic Urine, Clean Catch  Result Value Ref Range   Color, Urine AMBER (A) YELLOW   APPearance HAZY (A) CLEAR   Specific Gravity, Urine 1.033 (H) 1.005 - 1.030   pH 6.0 5.0 - 8.0   Glucose,  UA NEGATIVE NEGATIVE mg/dL   Hgb urine dipstick NEGATIVE NEGATIVE   Bilirubin Urine NEGATIVE NEGATIVE   Ketones, ur 80 (A) NEGATIVE mg/dL   Protein, ur 30 (A) NEGATIVE mg/dL   Nitrite NEGATIVE NEGATIVE   Leukocytes,Ua NEGATIVE NEGATIVE   RBC / HPF 11-20 0 - 5 RBC/hpf   WBC, UA 0-5 0 - 5 WBC/hpf   Bacteria, UA NONE SEEN NONE SEEN   Mucus PRESENT    CT ABDOMEN PELVIS W CONTRAST  Result Date: 05/17/2021 CLINICAL DATA:  Left upper quadrant pain EXAM: CT ABDOMEN AND PELVIS WITH CONTRAST TECHNIQUE: Multidetector CT imaging of the abdomen and pelvis was performed using the standard protocol following bolus administration of intravenous contrast. CONTRAST:  68mL OMNIPAQUE IOHEXOL 350 MG/ML SOLN COMPARISON:  12/11/2017 FINDINGS: Lower chest:  No contributory findings. Hepatobiliary: Hepatic steatosis.No evidence of biliary obstruction or stone. Pancreas: Peripancreatic edema and pancreatic expansion especially affecting the head and tail. No necrosis or collection. Spleen: Unremarkable. Adrenals/Urinary Tract: Negative adrenals. No hydronephrosis or stone. Unremarkable bladder. Stomach/Bowel: No obstruction. Submucosal fat in the  colon, a non-specific finding. No active bowel inflammation. Appendectomy. Vascular/Lymphatic: No acute vascular abnormality. No mass or adenopathy. Reproductive:No pathologic findings. Other: No ascites or pneumoperitoneum. Musculoskeletal: No acute abnormalities. IMPRESSION: Acute edematous pancreatitis without collection. Hepatic steatosis. Electronically Signed   By: Tiburcio Pea M.D.   On: 05/17/2021 08:03      Fayrene Helper, PA-C 05/17/21 0848    Margarita Grizzle, MD 05/18/21 [phone]

## 2021-05-17 NOTE — ED Notes (Signed)
ED TO INPATIENT HANDOFF REPORT  ED Nurse Name and Phone #:   S Name/Age/Gender Jeffrey Mathis 40 y.o. male Room/Bed: WA16/WA16  Code Status   Code Status: Full Code  Home/SNF/Other Home Patient oriented to: self, place, time, and situation Is this baseline? Yes   Triage Complete: Triage complete  Chief Complaint Acute pancreatitis [K85.90]  Triage Note Pt reports with abdominal pain and states that it feels like it's pancreatitis again. Pt states that the pain radiates to his mid back.    Allergies No Known Allergies  Level of Care/Admitting Diagnosis ED Disposition     ED Disposition  Admit   Condition  --   Comment  Hospital Area: [facility] [[id number]]  Level of Care: Telemetry [5]  Admit to tele based on following criteria: Monitor for Ischemic changes  May place patient in observation at [facility] or [facility] if equivalent level of care is available:: No  Covid Evaluation: Asymptomatic Screening Protocol (No Symptoms)  Diagnosis: Acute pancreatitis [577.0.ICD-9-CM]  Admitting Physician: Bobette Mo [[id number]]  Attending Physician: Bobette Mo [[id number]]          B Medical/Surgery History Past Medical History:  Diagnosis Date   Pancreatitis    PTSD (post-traumatic stress disorder)    Past Surgical History:  Procedure Laterality Date   ABDOMINAL SURGERY     scrapnel   APPENDECTOMY     arm surgery     KNEE ARTHROSCOPY       A IV Location/Drains/Wounds Patient Lines/Drains/Airways Status     Active Line/Drains/Airways     Name Placement date Placement time Site Days   Peripheral IV 05/17/21 20 G Left Antecubital 05/17/21  0630  Antecubital  less than 1            Intake/Output Last 24 hours  Intake/Output Summary (Last 24 hours) at 05/17/2021 2301 Last data filed at 05/17/2021 2119 Gross per 24 hour  Intake --  Output 1000 ml  Net -1000 ml    Labs/Imaging Results for orders placed or  performed during the hospital encounter of 05/17/21 (from the past 48 hour(s))  Comprehensive metabolic panel     Status: Abnormal   Collection Time: 05/17/21  4:45 AM  Result Value Ref Range   Sodium 135 135 - 145 mmol/L   Potassium 3.7 3.5 - 5.1 mmol/L   Chloride 99 98 - 111 mmol/L   CO2 26 22 - 32 mmol/L   Glucose, Bld 95 70 - 99 mg/dL    Comment: Glucose reference range applies only to samples taken after fasting for at least 8 hours.   BUN 10 6 - 20 mg/dL   Creatinine, Ser 9.52 0.61 - 1.24 mg/dL   Calcium 9.7 8.9 - 84.1 mg/dL   Total Protein 7.5 6.5 - 8.1 g/dL   Albumin 4.6 3.5 - 5.0 g/dL   AST 89 (H) 15 - 41 U/L   ALT 225 (H) 0 - 44 U/L   Alkaline Phosphatase 83 38 - 126 U/L   Total Bilirubin 1.8 (H) 0.3 - 1.2 mg/dL   GFR, Estimated >32 >44 mL/min    Comment: (NOTE) Calculated using the CKD-EPI Creatinine Equation (2021)    Anion gap 10 5 - 15    Comment: Performed at [facility], [address], [place], [place] [address]  Lipase, blood     Status: Abnormal   Collection Time: 05/17/21  4:45 AM  Result Value Ref Range   Lipase 413 (H) 11 -  51 U/L    Comment: RESULTS CONFIRMED BY MANUAL DILUTION Performed at [facility], [address], [place], [place] [address]   CBC with Diff     Status: Abnormal   Collection Time: 05/17/21  4:45 AM  Result Value Ref Range   WBC 9.8 4.0 - 10.5 K/uL   RBC 4.68 4.22 - 5.81 MIL/uL   Hemoglobin 16.0 13.0 - 17.0 g/dL   HCT 16.3 84.5 - 36.4 %   MCV 99.4 80.0 - 100.0 fL   MCH 34.2 (H) 26.0 - 34.0 pg   MCHC 34.4 30.0 - 36.0 g/dL   RDW 68.0 32.1 - 22.4 %   Platelets 208 150 - 400 K/uL   nRBC 0.0 0.0 - 0.2 %   Neutrophils Relative % 69 %   Neutro Abs 6.8 1.7 - 7.7 K/uL   Lymphocytes Relative 19 %   Lymphs Abs 1.8 0.7 - 4.0 K/uL   Monocytes Relative 10 %   Monocytes Absolute 1.0 0.1 - 1.0 K/uL   Eosinophils Relative 1 %   Eosinophils Absolute 0.1 0.0 - 0.5 K/uL   Basophils Relative 1 %    Basophils Absolute 0.1 0.0 - 0.1 K/uL   Immature Granulocytes 0 %   Abs Immature Granulocytes 0.04 0.00 - 0.07 K/uL    Comment: Performed at [facility], [address], [place], [place] [address]  Urinalysis, Routine w reflex microscopic Urine, Clean Catch     Status: Abnormal   Collection Time: 05/17/21  4:48 AM  Result Value Ref Range   Color, Urine AMBER (A) YELLOW    Comment: BIOCHEMICALS MAY BE AFFECTED BY COLOR   APPearance HAZY (A) CLEAR   Specific Gravity, Urine 1.033 (H) 1.005 - 1.030   pH 6.0 5.0 - 8.0   Glucose, UA NEGATIVE NEGATIVE mg/dL   Hgb urine dipstick NEGATIVE NEGATIVE   Bilirubin Urine NEGATIVE NEGATIVE   Ketones, ur 80 (A) NEGATIVE mg/dL   Protein, ur 30 (A) NEGATIVE mg/dL   Nitrite NEGATIVE NEGATIVE   Leukocytes,Ua NEGATIVE NEGATIVE   RBC / HPF 11-20 0 - 5 RBC/hpf   WBC, UA 0-5 0 - 5 WBC/hpf   Bacteria, UA NONE SEEN NONE SEEN   Mucus PRESENT     Comment: Performed at [facility], [address] [place], [place] [address]  Resp Panel by RT-PCR (Flu A&B, Covid) Nasopharyngeal Swab     Status: None   Collection Time: 05/17/21  8:33 AM   Specimen: Nasopharyngeal Swab; Nasopharyngeal(NP) swabs in vial transport medium  Result Value Ref Range   SARS Coronavirus 2 by RT PCR NEGATIVE NEGATIVE    Comment: (NOTE) SARS-CoV-2 target nucleic acids are NOT DETECTED.  The SARS-CoV-2 RNA is generally detectable in upper respiratory specimens during the acute phase of infection. The lowest concentration of SARS-CoV-2 viral copies this assay can detect is 138 copies/mL. A negative result does not preclude SARS-Cov-2 infection and should not be used as the sole basis for treatment or other patient management decisions. A negative result may occur with  improper specimen collection/handling, submission of specimen other than nasopharyngeal swab, presence of viral mutation(s) within the areas targeted by this assay, and inadequate  number of viral copies(<138 copies/mL). A negative result must be combined with clinical observations, patient history, and epidemiological information. The expected result is Negative.  Fact Sheet for Patients:  BloggerCourse.com  Fact Sheet for Healthcare Providers:  SeriousBroker.it  This test is no t yet approved or cleared by  the [employer] and  has been authorized for detection and/or diagnosis of SARS-CoV-2 by FDA under an Emergency Use Authorization (EUA). This EUA will remain  in effect (meaning this test can be used) for the duration of the COVID-19 declaration under Section 564(b)(1) of the Act, 21 U.S.C.section 360bbb-3(b)(1), unless the authorization is terminated  or revoked sooner.       Influenza A by PCR NEGATIVE NEGATIVE   Influenza B by PCR NEGATIVE NEGATIVE    Comment: (NOTE) The Xpert Xpress SARS-CoV-2/FLU/RSV plus assay is intended as an aid in the diagnosis of influenza from Nasopharyngeal swab specimens and should not be used as a sole basis for treatment. Nasal washings and aspirates are unacceptable for Xpert Xpress SARS-CoV-2/FLU/RSV testing.  Fact Sheet for Patients: BloggerCourse.com  Fact Sheet for Healthcare Providers: SeriousBroker.it  This test is not yet approved or cleared by the [place] FDA and has been authorized for detection and/or diagnosis of SARS-CoV-2 by FDA under an Emergency Use Authorization (EUA). This EUA will remain in effect (meaning this test can be used) for the duration of the COVID-19 declaration under Section 564(b)(1) of the Act, 21 U.S.C. section 360bbb-3(b)(1), unless the authorization is terminated or revoked.  Performed at [facility], [address] [place], [place] [address]    CT ABDOMEN PELVIS W CONTRAST  Result Date: 05/17/2021 CLINICAL DATA:  Left upper quadrant pain  EXAM: CT ABDOMEN AND PELVIS WITH CONTRAST TECHNIQUE: Multidetector CT imaging of the abdomen and pelvis was performed using the standard protocol following bolus administration of intravenous contrast. CONTRAST:  51mL OMNIPAQUE IOHEXOL 350 MG/ML SOLN COMPARISON:  12/11/2017 FINDINGS: Lower chest:  No contributory findings. Hepatobiliary: Hepatic steatosis.No evidence of biliary obstruction or stone. Pancreas: Peripancreatic edema and pancreatic expansion especially affecting the head and tail. No necrosis or collection. Spleen: Unremarkable. Adrenals/Urinary Tract: Negative adrenals. No hydronephrosis or stone. Unremarkable bladder. Stomach/Bowel: No obstruction. Submucosal fat in the colon, a non-specific finding. No active bowel inflammation. Appendectomy. Vascular/Lymphatic: No acute vascular abnormality. No mass or adenopathy. Reproductive:No pathologic findings. Other: No ascites or pneumoperitoneum. Musculoskeletal: No acute abnormalities. IMPRESSION: Acute edematous pancreatitis without collection. Hepatic steatosis. Electronically Signed   By: Tiburcio Pea M.D.   On: 05/17/2021 08:03    Pending Labs Unresulted Labs (From admission, onward)     Start     Ordered   05/18/21 0500  HIV Antibody (routine testing w rflx)  (HIV Antibody (Routine testing w reflex) panel)  Tomorrow morning,   R        05/17/21 1028   05/18/21 0500  Comprehensive metabolic panel  Daily,   R      05/17/21 1028   05/18/21 0500  CBC  Daily,   R      05/17/21 1028            Vitals/Pain Today's Vitals   05/17/21 2055 05/17/21 2100 05/17/21 2200 05/17/21 2204  BP:  (!) 147/89 (!) 144/86   Pulse:  (!) 48 (!) 48   Resp:  16 18   Temp:      TempSrc:      SpO2:  98% 95%   Weight:      Height:      PainSc: 6    4     Isolation Precautions No active isolations  Medications Medications  lactated ringers infusion ( Intravenous Rate/Dose Verify 05/17/21 1942)  acetaminophen (TYLENOL) tablet 650 mg (650 mg  Oral Given 05/17/21 1945)    Or  acetaminophen (TYLENOL) suppository 650 mg ( Rectal See Alternative 05/17/21 1945)  HYDROmorphone (DILAUDID) injection 1 mg (1 mg Intravenous Given 05/17/21 2117)  ondansetron (ZOFRAN) tablet 4 mg ( Oral See Alternative 05/17/21 1542)    Or  ondansetron (ZOFRAN) injection 4 mg (4 mg Intravenous Given 05/17/21 1542)  pantoprazole (PROTONIX) injection 40 mg (40 mg Intravenous Given 05/17/21 1101)  lactated ringers bolus 1,000 mL (0 mLs Intravenous Stopped 05/17/21 0838)  HYDROmorphone (DILAUDID) injection 1 mg (1 mg Intravenous Given 05/17/21 0630)  ondansetron (ZOFRAN) injection 4 mg (4 mg Intravenous Given 05/17/21 0630)  iohexol (OMNIPAQUE) 350 MG/ML injection 80 mL (80 mLs Intravenous Contrast Given 05/17/21 0727)  sodium chloride (PF) 0.9 % injection (  Given 05/17/21 0733)  HYDROmorphone (DILAUDID) injection 1 mg (1 mg Intravenous Given 05/17/21 0838)  lactated ringers bolus 2,000 mL (0 mLs Intravenous Stopped 05/17/21 1725)    Mobility walks Low fall risk   Focused Assessments     R Recommendations: See Admitting Provider Note  Report given to:   Additional Notes:

## 2021-05-18 DIAGNOSIS — K859 Acute pancreatitis without necrosis or infection, unspecified: Secondary | ICD-10-CM | POA: Diagnosis present

## 2021-05-18 DIAGNOSIS — Z818 Family history of other mental and behavioral disorders: Secondary | ICD-10-CM | POA: Diagnosis not present

## 2021-05-18 DIAGNOSIS — F17210 Nicotine dependence, cigarettes, uncomplicated: Secondary | ICD-10-CM | POA: Diagnosis present

## 2021-05-18 DIAGNOSIS — F1021 Alcohol dependence, in remission: Secondary | ICD-10-CM | POA: Diagnosis present

## 2021-05-18 DIAGNOSIS — Z79890 Hormone replacement therapy (postmenopausal): Secondary | ICD-10-CM | POA: Diagnosis not present

## 2021-05-18 DIAGNOSIS — Z20822 Contact with and (suspected) exposure to covid-19: Secondary | ICD-10-CM | POA: Diagnosis present

## 2021-05-18 DIAGNOSIS — F4310 Post-traumatic stress disorder, unspecified: Secondary | ICD-10-CM | POA: Diagnosis present

## 2021-05-18 DIAGNOSIS — K760 Fatty (change of) liver, not elsewhere classified: Secondary | ICD-10-CM | POA: Diagnosis present

## 2021-05-18 DIAGNOSIS — Z79899 Other long term (current) drug therapy: Secondary | ICD-10-CM | POA: Diagnosis not present

## 2021-05-18 DIAGNOSIS — R001 Bradycardia, unspecified: Secondary | ICD-10-CM | POA: Diagnosis not present

## 2021-05-18 LAB — COMPREHENSIVE METABOLIC PANEL
ALT: 143 U/L — ABNORMAL HIGH (ref 0–44)
AST: 44 U/L — ABNORMAL HIGH (ref 15–41)
Albumin: 3.9 g/dL (ref 3.5–5.0)
Alkaline Phosphatase: 67 U/L (ref 38–126)
Anion gap: 13 (ref 5–15)
BUN: <5 mg/dL — ABNORMAL LOW (ref 6–20)
CO2: 24 mmol/L (ref 22–32)
Calcium: 9.3 mg/dL (ref 8.9–10.3)
Chloride: 98 mmol/L (ref 98–111)
Creatinine, Ser: 0.71 mg/dL (ref 0.61–1.24)
GFR, Estimated: >60 mL/min (ref >60)
Glucose, Bld: 90 mg/dL (ref 70–99)
Potassium: 4 mmol/L (ref 3.5–5.1)
Sodium: 135 mmol/L (ref 135–145)
Total Bilirubin: 1.6 mg/dL — ABNORMAL HIGH (ref 0.3–1.2)
Total Protein: 6.7 g/dL (ref 6.5–8.1)

## 2021-05-18 LAB — CBC
HCT: 44.5 % (ref 39.0–52.0)
Hemoglobin: 15.2 g/dL (ref 13.0–17.0)
MCH: 34.2 pg — ABNORMAL HIGH (ref 26.0–34.0)
MCHC: 34.2 g/dL (ref 30.0–36.0)
MCV: 100.2 fL — ABNORMAL HIGH (ref 80.0–100.0)
Platelets: 188 10*3/uL (ref 150–400)
RBC: 4.44 MIL/uL (ref 4.22–5.81)
RDW: 12.4 % (ref 11.5–15.5)
WBC: 15.1 10*3/uL — ABNORMAL HIGH (ref 4.0–10.5)
nRBC: 0 % (ref 0.0–0.2)

## 2021-05-18 LAB — HIV ANTIBODY (ROUTINE TESTING W REFLEX): HIV Screen 4th Generation wRfx: NONREACTIVE

## 2021-05-18 MED ORDER — PANTOPRAZOLE SODIUM 40 MG IV SOLR
40.0000 mg | Freq: Two times a day (BID) | INTRAVENOUS | Status: DC
  Administered 2021-05-18 – 2021-05-20 (×5): 40 mg via INTRAVENOUS
  Filled 2021-05-18 (×4): qty 40

## 2021-05-18 NOTE — Progress Notes (Signed)
PROGRESS NOTE  Jeffrey Mathis WUJ:[id number] DOB: 02/26/1981 DOA: 05/17/2021 PCP: Myrlene Broker, MD   LOS: 0 days   Brief narrative:   Jeffrey Mathis is a 40 y.o. male with past medical history of prior pancreatitis, alcohol abuse for 7 years now not drinking in the last 5 months or more, PTSD, tobacco abuse presented to hospital with 2-day history of epigastric abdominal pain with nausea poor oral intake and vomiting with decreased urinary output.  In the ED, vitals were stable.  Urinalysis showed increased specific gravity.  CBC within normal range.  Liver function test showed AST of 89 and ALT 225 and lipase was elevated at 413.  Bilirubin was elevated at 1.8.  CT scan of the abdomen pelvis showed acute edematous pancreatitis with collection.  Patient was then considered for admission to hospital for acute pancreatitis.  Assessment/Plan:  Principal Problem:   Acute pancreatitis Active Problems:   Hepatic steatosis   Alcohol dependence in remission (HCC)   Tobacco abuse   Sinus bradycardia  Acute pancreatitis Currently NPO.  Continue IV fluids analgesics antiemetics, proton pump inhibitor.  We will try clears today.  COVID and influenza was negative.    Sinus bradycardia On presentation.  We will continue to monitor.  Asymptomatic     Hepatic steatosis Patient was counseled on cessation of alcohol.    Alcohol dependence in remission  As per the patient.  Encouraged to remain abstinent from alcohol      Tobacco abuse Declined nicotine patch.  Mild leukocytosis.  Could be reactive.  We will continue to monitor.  No signs of infection.  Check CBC in AM.  DVT prophylaxis: SCDs Start: 05/17/21 1027   Code Status: Full code  Family Communication:  Spoke with the patient's wife at bedside.  Status is: Observation  The patient will require care spanning > 2 midnights and should be moved to inpatient because: IV fluids, acute  pancreatitis,  Consultants: None  Procedures: None  Anti-infectives:  None  Anti-infectives (From admission, onward)    None      Subjective: Today, patient was seen and examined at bedside.  Patient states that 7/10 pain over the epigastric region which was better with Dilaudid but had 1 episode of vomiting after Dilaudid.   Objective: Vitals:   05/18/21 0229 05/18/21 0609  BP: (!) 143/89 131/86  Pulse: (!) 58 66  Resp: 18 16  Temp: 98.3 F (36.8 C) 98.2 F (36.8 C)  SpO2: 91% 98%    Intake/Output Summary (Last 24 hours) at 05/18/2021 0755 Last data filed at 05/18/2021 0300 Gross per 24 hour  Intake --  Output 1200 ml  Net -1200 ml   Filed Weights   05/17/21 0348 05/18/21 0225  Weight: 72.6 kg 71.3 kg   Body mass index is 20.74 kg/m.   Physical Exam: GENERAL: Patient is alert awake and oriented. Not in obvious distress. HENT: No scleral pallor or icterus. Pupils equally reactive to light. Oral mucosa is moist NECK: is supple, no gross swelling noted. CHEST: Clear to auscultation. No crackles or wheezes.  Diminished breath sounds bilaterally. CVS: S1 and S2 heard, no murmur. Regular rate and rhythm.  ABDOMEN: Epigastric tenderness noted on deep palpation. EXTREMITIES: No edema. CNS: Cranial nerves are intact. No focal motor deficits. SKIN: warm and dry without rashes.  Data Review: I have personally reviewed the following laboratory data and studies,  CBC: Recent Labs  Lab 05/17/21 0445 05/18/21 0451  WBC 9.8 15.1*  NEUTROABS 6.8  --  HGB 16.0 15.2  HCT 46.5 44.5  MCV 99.4 100.2*  PLT 208 188   Basic Metabolic Panel: Recent Labs  Lab 05/17/21 0445 05/18/21 0451  NA 135 135  K 3.7 4.0  CL 99 98  CO2 26 24  GLUCOSE 95 90  BUN 10 <5*  CREATININE 0.80 0.71  CALCIUM 9.7 9.3   Liver Function Tests: Recent Labs  Lab 05/17/21 0445 05/18/21 0451  AST 89* 44*  ALT 225* 143*  ALKPHOS 83 67  BILITOT 1.8* 1.6*  PROT 7.5 6.7  ALBUMIN  4.6 3.9   Recent Labs  Lab 05/17/21 0445  LIPASE 413*   No results for input(s): AMMONIA in the last 168 hours. Cardiac Enzymes: No results for input(s): CKTOTAL, CKMB, CKMBINDEX, TROPONINI in the last 168 hours. BNP (last 3 results) No results for input(s): BNP in the last 8760 hours.  ProBNP (last 3 results) No results for input(s): PROBNP in the last 8760 hours.  CBG: No results for input(s): GLUCAP in the last 168 hours. Recent Results (from the past 240 hour(s))  Resp Panel by RT-PCR (Flu A&B, Covid) Nasopharyngeal Swab     Status: None   Collection Time: 05/17/21  8:33 AM   Specimen: Nasopharyngeal Swab; Nasopharyngeal(NP) swabs in vial transport medium  Result Value Ref Range Status   SARS Coronavirus 2 by RT PCR NEGATIVE NEGATIVE Final    Comment: (NOTE) SARS-CoV-2 target nucleic acids are NOT DETECTED.  The SARS-CoV-2 RNA is generally detectable in upper respiratory specimens during the acute phase of infection. The lowest concentration of SARS-CoV-2 viral copies this assay can detect is 138 copies/mL. A negative result does not preclude SARS-Cov-2 infection and should not be used as the sole basis for treatment or other patient management decisions. A negative result may occur with  improper specimen collection/handling, submission of specimen other than nasopharyngeal swab, presence of viral mutation(s) within the areas targeted by this assay, and inadequate number of viral copies(<138 copies/mL). A negative result must be combined with clinical observations, patient history, and epidemiological information. The expected result is Negative.  Fact Sheet for Patients:  BloggerCourse.com  Fact Sheet for Healthcare Providers:  SeriousBroker.it  This test is no t yet approved or cleared by the [place] FDA and  has been authorized for detection and/or diagnosis of SARS-CoV-2 by FDA under an Emergency Use  Authorization (EUA). This EUA will remain  in effect (meaning this test can be used) for the duration of the COVID-19 declaration under Section 564(b)(1) of the Act, 21 U.S.C.section 360bbb-3(b)(1), unless the authorization is terminated  or revoked sooner.       Influenza A by PCR NEGATIVE NEGATIVE Final   Influenza B by PCR NEGATIVE NEGATIVE Final    Comment: (NOTE) The Xpert Xpress SARS-CoV-2/FLU/RSV plus assay is intended as an aid in the diagnosis of influenza from Nasopharyngeal swab specimens and should not be used as a sole basis for treatment. Nasal washings and aspirates are unacceptable for Xpert Xpress SARS-CoV-2/FLU/RSV testing.  Fact Sheet for Patients: BloggerCourse.com  Fact Sheet for Healthcare Providers: SeriousBroker.it  This test is not yet approved or cleared by the [place] FDA and has been authorized for detection and/or diagnosis of SARS-CoV-2 by FDA under an Emergency Use Authorization (EUA). This EUA will remain in effect (meaning this test can be used) for the duration of the COVID-19 declaration under Section 564(b)(1) of the Act, 21 U.S.C. section 360bbb-3(b)(1), unless the authorization is terminated or revoked.  Performed at [facility], [address], [place], [place] [address]      Studies: CT ABDOMEN PELVIS W CONTRAST  Result Date: 05/17/2021 CLINICAL DATA:  Left upper quadrant pain EXAM: CT ABDOMEN AND PELVIS WITH CONTRAST TECHNIQUE: Multidetector CT imaging of the abdomen and pelvis was performed using the standard protocol following bolus administration of intravenous contrast. CONTRAST:  37mL OMNIPAQUE IOHEXOL 350 MG/ML SOLN COMPARISON:  12/11/2017 FINDINGS: Lower chest:  No contributory findings. Hepatobiliary: Hepatic steatosis.No evidence of biliary obstruction or stone. Pancreas: Peripancreatic edema and pancreatic expansion especially affecting the head and  tail. No necrosis or collection. Spleen: Unremarkable. Adrenals/Urinary Tract: Negative adrenals. No hydronephrosis or stone. Unremarkable bladder. Stomach/Bowel: No obstruction. Submucosal fat in the colon, a non-specific finding. No active bowel inflammation. Appendectomy. Vascular/Lymphatic: No acute vascular abnormality. No mass or adenopathy. Reproductive:No pathologic findings. Other: No ascites or pneumoperitoneum. Musculoskeletal: No acute abnormalities. IMPRESSION: Acute edematous pancreatitis without collection. Hepatic steatosis. Electronically Signed   By: Tiburcio Pea M.D.   On: 05/17/2021 08:03      Joycelyn Das, MD  [facility] 05/18/2021  If 7PM-7AM, please contact night-coverage

## 2021-05-18 NOTE — Plan of Care (Signed)
Problem: Nutrition: °Goal: Adequate nutrition will be maintained °Outcome: Progressing °Note: Jeffrey Mathis will be started on clear liquids today to see if he can tolerate advancing diet °  °Problem: Activity: °Goal: Risk for activity intolerance will decrease °Outcome: Progressing °  °Problem: Clinical Measurements: °Goal: Ability to maintain clinical measurements within normal limits will improve °Outcome: Progressing °Goal: Will remain free from infection °Outcome: Progressing °Goal: Diagnostic test results will improve °Outcome: Progressing °Goal: Respiratory complications will improve °Outcome: Progressing °Goal: Cardiovascular complication will be avoided °Outcome: Progressing °  °Problem: Health Behavior/Discharge Planning: °Goal: Ability to manage health-related needs will improve °Outcome: Progressing °  °Problem: Education: °Goal: Knowledge of General Education information will improve °Description: Including pain rating scale, medication(s)/side effects and non-pharmacologic comfort measures °Outcome: Completed/Met °  °

## 2021-05-19 LAB — CBC
HCT: 40 % (ref 39.0–52.0)
Hemoglobin: 13.9 g/dL (ref 13.0–17.0)
MCH: 34.4 pg — ABNORMAL HIGH (ref 26.0–34.0)
MCHC: 34.8 g/dL (ref 30.0–36.0)
MCV: 99 fL (ref 80.0–100.0)
Platelets: 164 10*3/uL (ref 150–400)
RBC: 4.04 MIL/uL — ABNORMAL LOW (ref 4.22–5.81)
RDW: 12.2 % (ref 11.5–15.5)
WBC: 11.6 10*3/uL — ABNORMAL HIGH (ref 4.0–10.5)
nRBC: 0 % (ref 0.0–0.2)

## 2021-05-19 LAB — COMPREHENSIVE METABOLIC PANEL
ALT: 97 U/L — ABNORMAL HIGH (ref 0–44)
AST: 36 U/L (ref 15–41)
Albumin: 3.6 g/dL (ref 3.5–5.0)
Alkaline Phosphatase: 55 U/L (ref 38–126)
Anion gap: 9 (ref 5–15)
BUN: <5 mg/dL — ABNORMAL LOW (ref 6–20)
CO2: 27 mmol/L (ref 22–32)
Calcium: 9 mg/dL (ref 8.9–10.3)
Chloride: 98 mmol/L (ref 98–111)
Creatinine, Ser: 0.7 mg/dL (ref 0.61–1.24)
GFR, Estimated: >60 mL/min (ref >60)
Glucose, Bld: 90 mg/dL (ref 70–99)
Potassium: 3.6 mmol/L (ref 3.5–5.1)
Sodium: 134 mmol/L — ABNORMAL LOW (ref 135–145)
Total Bilirubin: 1.3 mg/dL — ABNORMAL HIGH (ref 0.3–1.2)
Total Protein: 6.3 g/dL — ABNORMAL LOW (ref 6.5–8.1)

## 2021-05-19 LAB — LIPASE, BLOOD: Lipase: 173 U/L — ABNORMAL HIGH (ref 11–51)

## 2021-05-19 LAB — MAGNESIUM: Magnesium: 1.7 mg/dL (ref 1.7–2.4)

## 2021-05-19 MED ORDER — ENSURE ENLIVE PO LIQD
237.0000 mL | Freq: Two times a day (BID) | ORAL | Status: DC
Start: 2021-05-19 — End: 2021-05-20
  Administered 2021-05-19: 237 mL via ORAL

## 2021-05-19 MED ORDER — HYDROMORPHONE HCL 1 MG/ML IJ SOLN
0.5000 mg | INTRAMUSCULAR | Status: DC | PRN
  Administered 2021-05-19 (×2): 0.5 mg via INTRAVENOUS
  Filled 2021-05-19 (×2): qty 0.5

## 2021-05-19 MED ORDER — BOOST / RESOURCE BREEZE PO LIQD CUSTOM: 1.0000 | Freq: Two times a day (BID) | ORAL | Status: DC

## 2021-05-19 MED ORDER — ADULT MULTIVITAMIN W/MINERALS CH
1.0000 | ORAL_TABLET | Freq: Every day | ORAL | Status: DC
  Administered 2021-05-19 – 2021-05-20 (×2): 1 via ORAL
  Filled 2021-05-19 (×2): qty 1

## 2021-05-19 MED ORDER — OXYCODONE-ACETAMINOPHEN 5-325 MG PO TABS
1.0000 | ORAL_TABLET | Freq: Four times a day (QID) | ORAL | Status: DC | PRN
Start: 2021-05-19 — End: 2021-05-20
  Administered 2021-05-19 – 2021-05-20 (×4): 2 via ORAL
  Filled 2021-05-19 (×5): qty 2

## 2021-05-19 NOTE — Progress Notes (Signed)
°  Transition of Care [facility]) Screening Note   Patient Details  Name: Allister Lessley Date of Birth: 1981-01-05   Transition of Care [facility]) CM/SW Contact:    Lanier Clam, RN Phone Number: 05/19/2021, 12:39 PM    Transition of Care Department [facility]) has reviewed patient and no TOC needs have been identified at this time. We will continue to monitor patient advancement through interdisciplinary progression rounds. If new patient transition needs arise, please place a TOC consult.

## 2021-05-19 NOTE — Progress Notes (Signed)
Initial Nutrition Assessment  DOCUMENTATION CODES:   Not applicable  INTERVENTION:  - will order Boost Breeze BID, each supplement provides 250 kcal and 9 grams of protein. - will order Ensure Enlive BID, each supplement provides 350 kcal and 20 grams of protein. - will order 1 tablet multivitamin with minerals/day. - complete NFPE when feasible.    NUTRITION DIAGNOSIS:   Increased nutrient needs related to acute illness as evidenced by estimated needs.  GOAL:   Patient will meet greater than or equal to 90% of their needs  MONITOR:   PO intake, Supplement acceptance, Labs, Weight trends  REASON FOR ASSESSMENT:   Malnutrition Screening Tool  ASSESSMENT:   40 y.o. male with medical history of prior pancreatitis, alcohol abuse for 7 years now not drinking in the last 5+ months, PTSD, and tobacco abuse. He presented to the ED due to 2 day hx of epigastric abdominal pain, N/V, poor oral intake, and decreased urinary output. In the ED, lipase was elevated to 413 and CT abdomen/pelvis showed acute edematous pancreatitis.  Unable to see patient at time of attempted visit. Diet advanced from NPO to CLD yesterday at 0943, to FLD today at 0900, and to Soft today at 1400. He consumed 50% of late breakfast on FLD and 100% of lunch yesterday and 100% of lunch on FLD today.   He has not been seen by a [place] RD at any time in the past.   Weight yesterday was 157 lb and weight on 12/14/20 was 160 lb. This indicates 3 lb weight loss (2% body weight) in the past 5 months; not significant for time frame.    Labs reviewed; Na: 134 mmol/l, BUN: <5 mg/dl, ALT elevated (trending down).  Medications reviewed; 40 mg IV protonix BID.  IVF; LR @ 125 ml/hr.     NUTRITION - FOCUSED PHYSICAL EXAM:  Unable to complete at this time.   Diet Order:   Diet Order             DIET SOFT Room service appropriate? Yes; Fluid consistency: Thin  Diet effective 1400                    EDUCATION NEEDS:   No education needs have been identified at this time  Skin:  Skin Assessment: Reviewed RN Assessment  Last BM:  PTA/unknown  Height:   Ht Readings from Last 1 Encounters:  05/18/21 6\' 1"  (1.854 m)    Weight:   Wt Readings from Last 1 Encounters:  05/18/21 71.3 kg     Estimated Nutritional Needs:  Kcal:  2250-2500 kcal Protein:  115-125 grams Fluid:  >/= 2.5 L/day       05/20/21, MS, RD, LDN, CNSC Inpatient Clinical Dietitian RD pager # available in [facility]  After hours/weekend pager # available in [facility]

## 2021-05-19 NOTE — Progress Notes (Signed)
PROGRESS NOTE  Jeffrey Mathis LNL:[id number] DOB: Feb 23, 1981 DOA: 05/17/2021 PCP: Myrlene Broker, MD   LOS: 1 day   Brief narrative:   Jeffrey Mathis is a 40 y.o. male with past medical history of prior pancreatitis, alcohol abuse for 7 years now not drinking in the last 5 months or more, PTSD, tobacco abuse presented to hospital with 2-day history of epigastric abdominal pain with nausea poor oral intake and vomiting with decreased urinary output.  In the ED, vitals were stable.  Urinalysis showed increased specific gravity.  CBC within normal range.  Liver function test showed AST of 89 and ALT 225 and lipase was elevated at 413.  Bilirubin was elevated at 1.8.  CT scan of the abdomen pelvis showed acute edematous pancreatitis with collection.  Jeffrey Mathis was then considered for admission to hospital for acute pancreatitis.  Assessment/Plan:  Principal Problem:   Acute pancreatitis Active Problems:   Hepatic steatosis   Alcohol dependence in remission (HCC)   Tobacco abuse   Sinus bradycardia  Acute pancreatitis   Continue IV fluids analgesics antiemetics, proton pump inhibitor.  Was able to hold her clears down but still complains of pain.  Wishes to be advanced on full liquids.  COVID and influenza was negative.  Lipase has trended down.  Less epigastric tenderness.  Will advance to soft diet and plan for tentative discharge in a.m. if clinically improved.    Sinus bradycardia On presentation.  We will continue to monitor.  Asymptomatic     Hepatic steatosis Jeffrey Mathis was counseled on cessation of alcohol.    Alcohol dependence in remission  As per the Jeffrey Mathis.  Encouraged to remain abstinent from alcohol      Tobacco abuse Declined nicotine patch.  Mild leukocytosis.  Could be reactive.  We will continue to monitor.  No signs of infection.  Check CBC in AM.  WBC at 11.6 today.  Disposition.  Likely home tomorrow if improved.  DVT prophylaxis: SCDs Start: 05/17/21  1027   Code Status: Full code  Family Communication:  Spoke with the Jeffrey Mathis' at bedside.  Status is: Inpatient  The Jeffrey Mathis is inpatient because: IV fluids, acute pancreatitis,  Consultants: None  Procedures: None  Anti-infectives:  None  Anti-infectives (From admission, onward)    None      Subjective: Today, Jeffrey Mathis was seen and examined at bedside.  Jeffrey Mathis complains of 7 x 10 pain but better than yesterday.  Has had no nausea vomiting.  Has tolerated clears.   Objective: Vitals:   05/18/21 2018 05/19/21 0507  BP: 126/87 (!) 141/87  Pulse: (!) 52 (!) 59  Resp:    Temp: 98.3 F (36.8 C) 98.3 F (36.8 C)  SpO2: 99% 100%    Intake/Output Summary (Last 24 hours) at 05/19/2021 1014 Last data filed at 05/18/2021 1800 Gross per 24 hour  Intake 1660.5 ml  Output 700 ml  Net 960.5 ml    Filed Weights   05/17/21 0348 05/18/21 0225  Weight: 72.6 kg 71.3 kg   Body mass index is 20.74 kg/m.   Physical Exam: GENERAL: Jeffrey Mathis is alert awake and oriented. Not in obvious distress. HENT: No scleral pallor or icterus. Pupils equally reactive to light. Oral mucosa is moist NECK: is supple, no gross swelling noted. CHEST: Clear to auscultation. No crackles or wheezes.  Diminished breath sounds bilaterally. CVS: S1 and S2 heard, no murmur. Regular rate and rhythm.  ABDOMEN: Mild epigastric tenderness on palpation. EXTREMITIES: No edema. CNS: Cranial nerves are intact. No  focal motor deficits. SKIN: warm and dry without rashes.  Data Review: I have personally reviewed the following laboratory data and studies,  CBC: Recent Labs  Lab 05/17/21 0445 05/18/21 0451 05/19/21 0500  WBC 9.8 15.1* 11.6*  NEUTROABS 6.8  --   --   HGB 16.0 15.2 13.9  HCT 46.5 44.5 40.0  MCV 99.4 100.2* 99.0  PLT 208 188 164    Basic Metabolic Panel: Recent Labs  Lab 05/17/21 0445 05/18/21 0451 05/19/21 0500  NA 135 135 134*  K 3.7 4.0 3.6  CL 99 98 98  CO2 26 24 27    GLUCOSE 95 90 90  BUN 10 <5* <5*  CREATININE 0.80 0.71 0.70  CALCIUM 9.7 9.3 9.0  MG  --   --  1.7    Liver Function Tests: Recent Labs  Lab 05/17/21 0445 05/18/21 0451 05/19/21 0500  AST 89* 44* 36  ALT 225* 143* 97*  ALKPHOS 83 67 55  BILITOT 1.8* 1.6* 1.3*  PROT 7.5 6.7 6.3*  ALBUMIN 4.6 3.9 3.6    Recent Labs  Lab 05/17/21 0445 05/19/21 0500  LIPASE 413* 173*    No results for input(s): AMMONIA in the last 168 hours. Cardiac Enzymes: No results for input(s): CKTOTAL, CKMB, CKMBINDEX, TROPONINI in the last 168 hours. BNP (last 3 results) No results for input(s): BNP in the last 8760 hours.  ProBNP (last 3 results) No results for input(s): PROBNP in the last 8760 hours.  CBG: No results for input(s): GLUCAP in the last 168 hours. Recent Results (from the past 240 hour(s))  Resp Panel by RT-PCR (Flu A&B, Covid) Nasopharyngeal Swab     Status: None   Collection Time: 05/17/21  8:33 AM   Specimen: Nasopharyngeal Swab; Nasopharyngeal(NP) swabs in vial transport medium  Result Value Ref Range Status   SARS Coronavirus 2 by RT PCR NEGATIVE NEGATIVE Final    Comment: (NOTE) SARS-CoV-2 target nucleic acids are NOT DETECTED.  The SARS-CoV-2 RNA is generally detectable in upper respiratory specimens during the acute phase of infection. The lowest concentration of SARS-CoV-2 viral copies this assay can detect is 138 copies/mL. A negative result does not preclude SARS-Cov-2 infection and should not be used as the sole basis for treatment or other Jeffrey Mathis management decisions. A negative result may occur with  improper specimen collection/handling, submission of specimen other than nasopharyngeal swab, presence of viral mutation(s) within the areas targeted by this assay, and inadequate number of viral copies(<138 copies/mL). A negative result must be combined with clinical observations, Jeffrey Mathis history, and epidemiological information. The expected result is  Negative.  Fact Sheet for Patients:  05/19/21  Fact Sheet for Healthcare Providers:  BloggerCourse.com  This test is no t yet approved or cleared by the [place] FDA and  has been authorized for detection and/or diagnosis of SARS-CoV-2 by FDA under an Emergency Use Authorization (EUA). This EUA will remain  in effect (meaning this test can be used) for the duration of the COVID-19 declaration under Section 564(b)(1) of the Act, 21 U.S.C.section 360bbb-3(b)(1), unless the authorization is terminated  or revoked sooner.       Influenza A by PCR NEGATIVE NEGATIVE Final   Influenza B by PCR NEGATIVE NEGATIVE Final    Comment: (NOTE) The Xpert Xpress SARS-CoV-2/FLU/RSV plus assay is intended as an aid in the diagnosis of influenza from Nasopharyngeal swab specimens and should not be used as a sole basis for treatment. Nasal washings and aspirates are unacceptable for Xpert Xpress SARS-CoV-2/FLU/RSV  testing.  Fact Sheet for Patients: BloggerCourse.com  Fact Sheet for Healthcare Providers: SeriousBroker.it  This test is not yet approved or cleared by the [place] FDA and has been authorized for detection and/or diagnosis of SARS-CoV-2 by FDA under an Emergency Use Authorization (EUA). This EUA will remain in effect (meaning this test can be used) for the duration of the COVID-19 declaration under Section 564(b)(1) of the Act, 21 U.S.C. section 360bbb-3(b)(1), unless the authorization is terminated or revoked.  Performed at [facility], [address] [place], [place] [address]       Studies: No results found.    Joycelyn Das, MD  [facility] 05/19/2021  If 7PM-7AM, please contact night-coverage

## 2021-05-20 LAB — COMPREHENSIVE METABOLIC PANEL
ALT: 87 U/L — ABNORMAL HIGH (ref 0–44)
AST: 41 U/L (ref 15–41)
Albumin: 3.4 g/dL — ABNORMAL LOW (ref 3.5–5.0)
Alkaline Phosphatase: 56 U/L (ref 38–126)
Anion gap: 9 (ref 5–15)
BUN: <5 mg/dL — ABNORMAL LOW (ref 6–20)
CO2: 28 mmol/L (ref 22–32)
Calcium: 9.1 mg/dL (ref 8.9–10.3)
Chloride: 100 mmol/L (ref 98–111)
Creatinine, Ser: 0.74 mg/dL (ref 0.61–1.24)
GFR, Estimated: >60 mL/min (ref >60)
Glucose, Bld: 98 mg/dL (ref 70–99)
Potassium: 3.6 mmol/L (ref 3.5–5.1)
Sodium: 137 mmol/L (ref 135–145)
Total Bilirubin: 1 mg/dL (ref 0.3–1.2)
Total Protein: 6.2 g/dL — ABNORMAL LOW (ref 6.5–8.1)

## 2021-05-20 LAB — CBC
HCT: 38.5 % — ABNORMAL LOW (ref 39.0–52.0)
Hemoglobin: 13.5 g/dL (ref 13.0–17.0)
MCH: 34.3 pg — ABNORMAL HIGH (ref 26.0–34.0)
MCHC: 35.1 g/dL (ref 30.0–36.0)
MCV: 97.7 fL (ref 80.0–100.0)
Platelets: 171 10*3/uL (ref 150–400)
RBC: 3.94 MIL/uL — ABNORMAL LOW (ref 4.22–5.81)
RDW: 12 % (ref 11.5–15.5)
WBC: 8 10*3/uL (ref 4.0–10.5)
nRBC: 0 % (ref 0.0–0.2)

## 2021-05-20 LAB — MAGNESIUM: Magnesium: 1.6 mg/dL — ABNORMAL LOW (ref 1.7–2.4)

## 2021-05-20 MED ORDER — PANTOPRAZOLE SODIUM 40 MG PO TBEC: 40.0000 mg | DELAYED_RELEASE_TABLET | Freq: Every day | ORAL | 0 refills | Status: DC

## 2021-05-20 MED ORDER — OXYCODONE-ACETAMINOPHEN 5-325 MG PO TABS: 1.0000 | ORAL_TABLET | Freq: Four times a day (QID) | ORAL | 0 refills | Status: DC | PRN

## 2021-05-20 MED ORDER — MAGNESIUM OXIDE -MG SUPPLEMENT 400 (240 MG) MG PO TABS
400.0000 mg | ORAL_TABLET | Freq: Two times a day (BID) | ORAL | Status: DC
  Administered 2021-05-20: 400 mg via ORAL
  Filled 2021-05-20: qty 1

## 2021-05-20 MED ORDER — MAGNESIUM OXIDE -MG SUPPLEMENT 400 (240 MG) MG PO TABS: 400.0000 mg | ORAL_TABLET | Freq: Every day | ORAL | 0 refills | Status: AC

## 2021-05-20 NOTE — Plan of Care (Signed)
Discharge instructions reviewed. Questions concerns were denied at this time. Pt reports feeling much better. Pain medication administered. Pt remains alert, oriented x4, ambulatory without assistance.

## 2021-05-20 NOTE — Discharge Summary (Signed)
Physician Discharge Summary  Jeffrey Mathis NFA:[id number] DOB: [birth year] DOA: 05/17/2021  PCP: Jeffrey Broker, MD  Admit date: 05/17/2021 Discharge date: 05/20/2021  Admitted From: Home  Discharge disposition: Home  Recommendations for Outpatient Follow-Up:   Follow up with your primary care provider in one week.  Check CBC, BMP, magnesium in the next visit  Discharge Diagnosis:   Principal Problem:   Acute pancreatitis Active Problems:   Hepatic steatosis   Alcohol dependence in remission (HCC)   Tobacco abuse   Sinus bradycardia  Discharge Condition: Improved.  Diet recommendation: Low-fat diet..  Wound care: None.  Code status: Full.   History of Present Illness:   Jeffrey Mathis is a 40 y.o. male with past medical history of prior pancreatitis, alcohol abuse for 7 years now not drinking in the last 5 months or more, PTSD, tobacco abuse presented to hospital with 2-day history of epigastric abdominal pain with nausea poor oral intake and vomiting with decreased urinary output.  In the ED, vitals were stable.  Urinalysis showed increased specific gravity.  CBC was within normal range.  Liver function test showed AST of 89 and ALT 225 and lipase was elevated at 413.  Bilirubin was elevated at 1.8.  CT scan of the abdomen pelvis showed acute edematous pancreatitis with collection.  Patient was then considered for admission to hospital for acute pancreatitis.   Hospital Course:   Following conditions were addressed during hospitalization as listed below,  Acute pancreatitis Patient was treated conservatively during hospitalization with IV fluids antiemetics proton pump inhibitor and IV analgesics.  At this time patient has improved.  Has been able to tolerate soft diet.  He was advised low-fat diet on discharge.  Abstinence of alcohol was encouraged    Sinus bradycardia On presentation.  Asymptomatic.     Hepatic steatosis Patient was counseled on  cessation of alcohol.     Alcohol dependence in remission  As per the patient.  Encouraged to remain abstinent from alcohol.  Did not have any withdrawal symptoms during hospitalization     Tobacco abuse Declined nicotine patch.   Mild leukocytosis.  Reactive.  Normalized   Disposition.  At this time, patient is stable for disposition home with outpatient PCP follow-up with  Medical Consultants:   None.  Procedures:    None Subjective:   Today, patient was seen and examined at bedside.  Feels better.  Denies overt pain.  Has tolerated soft diet.  Wishes to go home.  Discharge Exam:   Vitals:   05/19/21 1948 05/20/21 0447  BP: (!) 129/91 135/88  Pulse: 78 (!) 48  Resp: 16 16  Temp: 98.1 F (36.7 C) 97.7 F (36.5 C)  SpO2: 98% 99%   Vitals:   05/19/21 0507 05/19/21 1407 05/19/21 1948 05/20/21 0447  BP: (!) 141/87 129/84 (!) 129/91 135/88  Pulse: (!) 59 (!) 56 78 (!) 48  Resp:  Temp: 98.3 F (36.8 C) 98.6 F (37 C) 98.1 F (36.7 C) 97.7 F (36.5 C)  TempSrc: Oral Oral Oral Oral  SpO2: 100% 98% 98% 99%  Weight:      Height:        General: Alert awake, not in obvious distress HENT: pupils equally reacting to light,  No scleral pallor or icterus noted. Oral mucosa is moist.  Chest:  Clear breath sounds.  Diminished breath sounds bilaterally. No crackles or wheezes.  CVS: S1 &S2 heard. No murmur.  Regular rate and rhythm. Abdomen: Soft,  nontender, nondistended.  Bowel sounds are heard.   Extremities: No cyanosis, clubbing or edema.  Peripheral pulses are palpable. Psych: Alert, awake and oriented, normal mood CNS:  No cranial nerve deficits.  Power equal in all extremities.   Skin: Warm and dry.  No rashes noted.  The results of significant diagnostics from this hospitalization (including imaging, microbiology, ancillary and laboratory) are listed below for reference.     Diagnostic Studies:   CT ABDOMEN PELVIS W CONTRAST  Result Date:  05/17/2021 CLINICAL DATA:  Left upper quadrant pain EXAM: CT ABDOMEN AND PELVIS WITH CONTRAST TECHNIQUE: Multidetector CT imaging of the abdomen and pelvis was performed using the standard protocol following bolus administration of intravenous contrast. CONTRAST:  48mL OMNIPAQUE IOHEXOL 350 MG/ML SOLN COMPARISON:  12/11/2017 FINDINGS: Lower chest:  No contributory findings. Hepatobiliary: Hepatic steatosis.No evidence of biliary obstruction or stone. Pancreas: Peripancreatic edema and pancreatic expansion especially affecting the head and tail. No necrosis or collection. Spleen: Unremarkable. Adrenals/Urinary Tract: Negative adrenals. No hydronephrosis or stone. Unremarkable bladder. Stomach/Bowel: No obstruction. Submucosal fat in the colon, a non-specific finding. No active bowel inflammation. Appendectomy. Vascular/Lymphatic: No acute vascular abnormality. No mass or adenopathy. Reproductive:No pathologic findings. Other: No ascites or pneumoperitoneum. Musculoskeletal: No acute abnormalities. IMPRESSION: Acute edematous pancreatitis without collection. Hepatic steatosis. Electronically Signed   By: Tiburcio Pea M.D.   On: 05/17/2021 08:03     Labs:   Basic Metabolic Panel: Recent Labs  Lab 05/17/21 0445 05/18/21 0451 05/19/21 0500 05/20/21 0531  NA 135 135 134* 137  K 3.7 4.0 3.6 3.6  CL 99 98 98 100  CO2 26 24 27 28   GLUCOSE 95 90 90 98  BUN 10 <5* <5* <5*  CREATININE 0.80 0.71 0.70 0.74  CALCIUM 9.7 9.3 9.0 9.1  MG  --   --  1.7 1.6*   GFR Estimated Creatinine Clearance: 126.3 mL/min (by C-G formula based on SCr of 0.74 mg/dL). Liver Function Tests: Recent Labs  Lab 05/17/21 0445 05/18/21 0451 05/19/21 0500 05/20/21 0531  AST 89* 44* 36 41  ALT 225* 143* 97* 87*  ALKPHOS 83 67 55 56  BILITOT 1.8* 1.6* 1.3* 1.0  PROT 7.5 6.7 6.3* 6.2*  ALBUMIN 4.6 3.9 3.6 3.4*   Recent Labs  Lab 05/17/21 0445 05/19/21 0500  LIPASE 413* 173*   No results for input(s): AMMONIA in  the last 168 hours. Coagulation profile No results for input(s): INR, PROTIME in the last 168 hours.  CBC: Recent Labs  Lab 05/17/21 0445 05/18/21 0451 05/19/21 0500 05/20/21 0531  WBC 9.8 15.1* 11.6* 8.0  NEUTROABS 6.8  --   --   --   HGB 16.0 15.2 13.9 13.5  HCT 46.5 44.5 40.0 38.5*  MCV 99.4 100.2* 99.0 97.7  PLT 208 188 164 171   Cardiac Enzymes: No results for input(s): CKTOTAL, CKMB, CKMBINDEX, TROPONINI in the last 168 hours. BNP: Invalid input(s): POCBNP CBG: No results for input(s): GLUCAP in the last 168 hours. D-Dimer No results for input(s): DDIMER in the last 72 hours. Hgb A1c No results for input(s): HGBA1C in the last 72 hours. Lipid Profile No results for input(s): CHOL, HDL, LDLCALC, TRIG, CHOLHDL, LDLDIRECT in the last 72 hours. Thyroid function studies No results for input(s): TSH, T4TOTAL, T3FREE, THYROIDAB in the last 72 hours.  Invalid input(s): FREET3 Anemia work up No results for input(s): VITAMINB12, FOLATE, FERRITIN, TIBC, IRON, RETICCTPCT in the last 72 hours. Microbiology Recent Results (from the past 240 hour(s))  Resp  Panel by RT-PCR (Flu A&B, Covid) Nasopharyngeal Swab     Status: None   Collection Time: 05/17/21  8:33 AM   Specimen: Nasopharyngeal Swab; Nasopharyngeal(NP) swabs in vial transport medium  Result Value Ref Range Status   SARS Coronavirus 2 by RT PCR NEGATIVE NEGATIVE Final    Comment: (NOTE) SARS-CoV-2 target nucleic acids are NOT DETECTED.  The SARS-CoV-2 RNA is generally detectable in upper respiratory specimens during the acute phase of infection. The lowest concentration of SARS-CoV-2 viral copies this assay can detect is 138 copies/mL. A negative result does not preclude SARS-Cov-2 infection and should not be used as the sole basis for treatment or other patient management decisions. A negative result may occur with  improper specimen collection/handling, submission of specimen other than nasopharyngeal swab,  presence of viral mutation(s) within the areas targeted by this assay, and inadequate number of viral copies(<138 copies/mL). A negative result must be combined with clinical observations, patient history, and epidemiological information. The expected result is Negative.  Fact Sheet for Patients:  BloggerCourse.com  Fact Sheet for Healthcare Providers:  SeriousBroker.it  This test is no t yet approved or cleared by the [place] FDA and  has been authorized for detection and/or diagnosis of SARS-CoV-2 by FDA under an Emergency Use Authorization (EUA). This EUA will remain  in effect (meaning this test can be used) for the duration of the COVID-19 declaration under Section 564(b)(1) of the Act, 21 U.S.C.section 360bbb-3(b)(1), unless the authorization is terminated  or revoked sooner.       Influenza A by PCR NEGATIVE NEGATIVE Final   Influenza B by PCR NEGATIVE NEGATIVE Final    Comment: (NOTE) The Xpert Xpress SARS-CoV-2/FLU/RSV plus assay is intended as an aid in the diagnosis of influenza from Nasopharyngeal swab specimens and should not be used as a sole basis for treatment. Nasal washings and aspirates are unacceptable for Xpert Xpress SARS-CoV-2/FLU/RSV testing.  Fact Sheet for Patients: BloggerCourse.com  Fact Sheet for Healthcare Providers: SeriousBroker.it  This test is not yet approved or cleared by the [place] FDA and has been authorized for detection and/or diagnosis of SARS-CoV-2 by FDA under an Emergency Use Authorization (EUA). This EUA will remain in effect (meaning this test can be used) for the duration of the COVID-19 declaration under Section 564(b)(1) of the Act, 21 U.S.C. section 360bbb-3(b)(1), unless the authorization is terminated or revoked.  Performed at [facility], [address] [place], [place] [address]       Discharge Instructions:   Discharge Instructions     Call MD for:  persistant nausea and vomiting   Complete by: As directed    Call MD for:  severe uncontrolled pain   Complete by: As directed    Diet - low sodium heart healthy   Complete by: As directed    Discharge instructions   Complete by: As directed    Follow up with your primary care provider in one week. Stay on soft diet, low fat diet. Please seek medical attention for worsening symptoms. No alcohol please.   Increase activity slowly   Complete by: As directed       Allergies as of 05/20/2021   No Known Allergies      Medication List     STOP taking these medications    famotidine 20 MG tablet Commonly known as: PEPCID       TAKE these medications    acetaminophen 325 MG tablet Commonly known as: TYLENOL Take 650 mg  by mouth every 6 (six) hours as needed for mild pain, fever or headache.   ibuprofen 200 MG tablet Commonly known as: ADVIL Take 400 mg by mouth daily as needed for moderate pain.   magnesium oxide 400 (240 Mg) MG tablet Commonly known as: MAG-OX Take 1 tablet (400 mg total) by mouth daily.   oxyCODONE-acetaminophen 5-325 MG tablet Commonly known as: PERCOCET/ROXICET Take 1 tablet by mouth every 6 (six) hours as needed for moderate pain or severe pain.   pantoprazole 40 MG tablet Commonly known as: Protonix Take 1 tablet (40 mg total) by mouth daily.          Time coordinating discharge: 39 minutes  Signed:  Danalee Flath  [facility] 05/20/2021, 9:18 AM

## 2021-05-23 ENCOUNTER — Telehealth: Payer: Self-pay

## 2021-05-23 NOTE — Telephone Encounter (Signed)
Transition Care Management Unsuccessful Follow-up Telephone Call  Date of discharge and from where:  05/20/2021 from [facility]  Attempts:  1st Attempt  Reason for unsuccessful TCM follow-up call:  Left voice message  Nurse Note:  Left message on patient voice mail.  Needs Hospital follow up with PCP x 1 week Check CBC, BMP, magnesium.  Willette Cluster, LPN.

## 2021-11-03 ENCOUNTER — Encounter (HOSPITAL_COMMUNITY): Payer: Self-pay

## 2021-11-03 ENCOUNTER — Emergency Department (HOSPITAL_COMMUNITY): Payer: Non-veteran care

## 2021-11-03 ENCOUNTER — Other Ambulatory Visit: Payer: Self-pay

## 2021-11-03 ENCOUNTER — Inpatient Hospital Stay (HOSPITAL_COMMUNITY)
Admission: EM | Admit: 2021-11-03 | Discharge: 2021-11-06 | DRG: 440 | Disposition: A | Discharge status: Home / Self Care | Payer: Non-veteran care | Attending: Internal Medicine | Admitting: Internal Medicine

## 2021-11-03 DIAGNOSIS — K219 Gastro-esophageal reflux disease without esophagitis: Secondary | ICD-10-CM | POA: Diagnosis present

## 2021-11-03 DIAGNOSIS — K852 Alcohol induced acute pancreatitis without necrosis or infection: Secondary | ICD-10-CM | POA: Diagnosis not present

## 2021-11-03 DIAGNOSIS — D72829 Elevated white blood cell count, unspecified: Secondary | ICD-10-CM

## 2021-11-03 DIAGNOSIS — K859 Acute pancreatitis without necrosis or infection, unspecified: Secondary | ICD-10-CM | POA: Diagnosis not present

## 2021-11-03 DIAGNOSIS — F1010 Alcohol abuse, uncomplicated: Secondary | ICD-10-CM | POA: Diagnosis present

## 2021-11-03 DIAGNOSIS — K760 Fatty (change of) liver, not elsewhere classified: Secondary | ICD-10-CM | POA: Diagnosis present

## 2021-11-03 DIAGNOSIS — Z818 Family history of other mental and behavioral disorders: Secondary | ICD-10-CM

## 2021-11-03 DIAGNOSIS — Z79899 Other long term (current) drug therapy: Secondary | ICD-10-CM

## 2021-11-03 DIAGNOSIS — F4310 Post-traumatic stress disorder, unspecified: Secondary | ICD-10-CM | POA: Diagnosis present

## 2021-11-03 DIAGNOSIS — R001 Bradycardia, unspecified: Secondary | ICD-10-CM | POA: Diagnosis present

## 2021-11-03 DIAGNOSIS — F17210 Nicotine dependence, cigarettes, uncomplicated: Secondary | ICD-10-CM | POA: Diagnosis present

## 2021-11-03 LAB — URINALYSIS, ROUTINE W REFLEX MICROSCOPIC
Bacteria, UA: NONE SEEN
Bilirubin Urine: NEGATIVE
Glucose, UA: NEGATIVE mg/dL
Ketones, ur: 20 mg/dL — AB
Leukocytes,Ua: NEGATIVE
Nitrite: NEGATIVE
Protein, ur: NEGATIVE mg/dL
Specific Gravity, Urine: >1.046 — ABNORMAL HIGH (ref 1.005–1.030)
pH: 6 (ref 5.0–8.0)

## 2021-11-03 LAB — CBC WITH DIFFERENTIAL/PLATELET
Abs Immature Granulocytes: 0.06 10*3/uL (ref 0.00–0.07)
Basophils Absolute: 0.1 10*3/uL (ref 0.0–0.1)
Basophils Relative: 0 %
Eosinophils Absolute: 0 10*3/uL (ref 0.0–0.5)
Eosinophils Relative: 0 %
HCT: 44.4 % (ref 39.0–52.0)
Hemoglobin: 15.5 g/dL (ref 13.0–17.0)
Immature Granulocytes: 0 %
Lymphocytes Relative: 10 %
Lymphs Abs: 1.7 10*3/uL (ref 0.7–4.0)
MCH: 33 pg (ref 26.0–34.0)
MCHC: 34.9 g/dL (ref 30.0–36.0)
MCV: 94.5 fL (ref 80.0–100.0)
Monocytes Absolute: 1.2 10*3/uL — ABNORMAL HIGH (ref 0.1–1.0)
Monocytes Relative: 6 %
Neutro Abs: 15 10*3/uL — ABNORMAL HIGH (ref 1.7–7.7)
Neutrophils Relative %: 84 %
Platelets: 271 10*3/uL (ref 150–400)
RBC: 4.7 MIL/uL (ref 4.22–5.81)
RDW: 12.2 % (ref 11.5–15.5)
WBC: 18 10*3/uL — ABNORMAL HIGH (ref 4.0–10.5)
nRBC: 0 % (ref 0.0–0.2)

## 2021-11-03 LAB — COMPREHENSIVE METABOLIC PANEL
ALT: 17 U/L (ref 0–44)
AST: 19 U/L (ref 15–41)
Albumin: 4.4 g/dL (ref 3.5–5.0)
Alkaline Phosphatase: 66 U/L (ref 38–126)
Anion gap: 8 (ref 5–15)
BUN: 14 mg/dL (ref 6–20)
CO2: 25 mmol/L (ref 22–32)
Calcium: 8.7 mg/dL — ABNORMAL LOW (ref 8.9–10.3)
Chloride: 108 mmol/L (ref 98–111)
Creatinine, Ser: 0.83 mg/dL (ref 0.61–1.24)
GFR, Estimated: >60 mL/min (ref >60)
Glucose, Bld: 99 mg/dL (ref 70–99)
Potassium: 4 mmol/L (ref 3.5–5.1)
Sodium: 141 mmol/L (ref 135–145)
Total Bilirubin: 1.3 mg/dL — ABNORMAL HIGH (ref 0.3–1.2)
Total Protein: 7.3 g/dL (ref 6.5–8.1)

## 2021-11-03 LAB — LIPID PANEL
Cholesterol: 125 mg/dL (ref 0–200)
HDL: 40 mg/dL — ABNORMAL LOW (ref >40)
LDL Cholesterol: 48 mg/dL (ref 0–99)
Total CHOL/HDL Ratio: 3.1 RATIO
Triglycerides: 185 mg/dL — ABNORMAL HIGH (ref <150)
VLDL: 37 mg/dL (ref 0–40)

## 2021-11-03 LAB — LIPASE, BLOOD: Lipase: 202 U/L — ABNORMAL HIGH (ref 11–51)

## 2021-11-03 MED ORDER — HYDROMORPHONE HCL 1 MG/ML IJ SOLN
1.0000 mg | Freq: Once | INTRAMUSCULAR | Status: AC
  Administered 2021-11-03: 1 mg via INTRAVENOUS
  Filled 2021-11-03: qty 1

## 2021-11-03 MED ORDER — KETOROLAC TROMETHAMINE 30 MG/ML IJ SOLN
30.0000 mg | Freq: Four times a day (QID) | INTRAMUSCULAR | Status: AC | PRN
  Administered 2021-11-03 – 2021-11-05 (×5): 30 mg via INTRAVENOUS
  Filled 2021-11-03 (×5): qty 1

## 2021-11-03 MED ORDER — IOHEXOL 300 MG/ML  SOLN
100.0000 mL | Freq: Once | INTRAMUSCULAR | Status: AC | PRN
  Administered 2021-11-03: 100 mL via INTRAVENOUS

## 2021-11-03 MED ORDER — SODIUM CHLORIDE 0.9 % IV SOLN: INTRAVENOUS | Status: DC

## 2021-11-03 MED ORDER — HYDROMORPHONE HCL 1 MG/ML IJ SOLN
0.5000 mg | INTRAMUSCULAR | Status: DC | PRN
  Administered 2021-11-03 – 2021-11-04 (×4): 0.5 mg via INTRAVENOUS
  Filled 2021-11-03 (×2): qty 0.5
  Filled 2021-11-03: qty 1
  Filled 2021-11-03: qty 0.5

## 2021-11-03 MED ORDER — MORPHINE SULFATE (PF) 4 MG/ML IV SOLN
4.0000 mg | Freq: Once | INTRAVENOUS | Status: AC
  Administered 2021-11-03: 4 mg via INTRAVENOUS
  Filled 2021-11-03: qty 1

## 2021-11-03 MED ORDER — SODIUM CHLORIDE (PF) 0.9 % IJ SOLN
INTRAMUSCULAR | Status: AC
  Filled 2021-11-03: qty 50

## 2021-11-03 MED ORDER — ONDANSETRON HCL 4 MG/2ML IJ SOLN
4.0000 mg | Freq: Once | INTRAMUSCULAR | Status: AC
  Administered 2021-11-03: 4 mg via INTRAVENOUS
  Filled 2021-11-03: qty 2

## 2021-11-03 MED ORDER — BISACODYL 5 MG PO TBEC: 5.0000 mg | DELAYED_RELEASE_TABLET | Freq: Every day | ORAL | Status: DC | PRN

## 2021-11-03 MED ORDER — ACETAMINOPHEN 325 MG PO TABS: 650.0000 mg | ORAL_TABLET | Freq: Four times a day (QID) | ORAL | Status: DC | PRN

## 2021-11-03 MED ORDER — SODIUM CHLORIDE 0.9 % IV SOLN: Freq: Once | INTRAVENOUS | Status: AC

## 2021-11-03 MED ORDER — LORAZEPAM 2 MG/ML IJ SOLN: 0.5000 mg | Freq: Four times a day (QID) | INTRAMUSCULAR | Status: DC | PRN

## 2021-11-03 MED ORDER — SODIUM CHLORIDE 0.9 % IV BOLUS
1000.0000 mL | Freq: Once | INTRAVENOUS | Status: AC
  Administered 2021-11-03: 1000 mL via INTRAVENOUS

## 2021-11-03 MED ORDER — ONDANSETRON HCL 4 MG PO TABS: 4.0000 mg | ORAL_TABLET | Freq: Four times a day (QID) | ORAL | Status: DC | PRN

## 2021-11-03 MED ORDER — PANTOPRAZOLE SODIUM 40 MG PO TBEC
40.0000 mg | DELAYED_RELEASE_TABLET | Freq: Every day | ORAL | Status: DC
  Administered 2021-11-04 – 2021-11-06 (×3): 40 mg via ORAL
  Filled 2021-11-03 (×3): qty 1

## 2021-11-03 MED ORDER — ONDANSETRON HCL 4 MG/2ML IJ SOLN: 4.0000 mg | Freq: Four times a day (QID) | INTRAMUSCULAR | Status: DC | PRN

## 2021-11-03 NOTE — ED Triage Notes (Signed)
Patient c/o abdominal pain and vomiting since last night and SOB this AM. Patient reports a history of pancreatitis.

## 2021-11-03 NOTE — ED Notes (Signed)
Patient ambulatory to Xray.

## 2021-11-03 NOTE — H&P (Signed)
History and Physical    Jeffrey Mathis JXB:[id number] DOB: Sep 04, 1980 DOA: 11/03/2021  PCP: Myrlene Brokerrawford, Elizabeth A, MD (Confirm with patient/family/NH records and if not entered, this has to [facility]) Patient coming from: Home  I have personally briefly [facility]  Chief Complaint: I think pancreatitis comes back  HPI: Jeffrey Mathis is a 41 y.o. male with medical history significant of recurrent pancreatitis, history of alcohol abuse, fatty liver, sinus bradycardia, GERD, presented with sudden onset of epigastric pain nauseous vomiting.  Symptoms started yesterday, suddenly and gradually getting worse, the pain with epigastric and then involved the whole upper abdomen and bilateral flank and back, has become constant this morning, cramping-like, associated with severe feeling of nauseous vomiting normal bowel nonbloody non-coffee-ground stuff.  Denies any fever chills no diarrhea.  Patient reported that he has stayed sober for 2+ month from alcohol after successful outpatient detox program 788-month ago.  He reported abdominal wound symptoms are exactly like the past 2 episode of acute pancreatitis last year and this year.  Patient has a new job last 2 months and has been eating fatty/fast food more often and wonder there that is causing the recurrent pancreatitis.  ED Course: Borderline bradycardia, blood pressure stable, afebrile.  CT abdomen pelvis with with contrast showed acute uncomplicated pancreatitis no evidence of pancreatic necrosis, small food lies adjacent to the pancreatic head, no mass or duct dilation no peripancreatic fluid collection.  Lipase 202.  WBC 18.  Review of Systems: As per HPI otherwise 14 point review of systems negative.    Past Medical History:  Diagnosis Date   Pancreatitis    PTSD (post-traumatic stress disorder)     Past Surgical History:  Procedure Laterality Date   ABDOMINAL  SURGERY     scrapnel   APPENDECTOMY     arm surgery     KNEE ARTHROSCOPY       reports that he has been smoking cigarettes. He has been smoking an average of .5 packs per day. He has never used smokeless tobacco. He reports that he does not currently use alcohol. He reports current drug use. Drug: Marijuana.  No Known Allergies  Family History  Problem Relation Age of Onset   Cancer Mother    Bipolar disorder Mother    Cancer Father    Pancreatitis Father      Prior to Admission medications   Medication Sig Start Date End Date Taking? Authorizing Provider  acetaminophen (TYLENOL) 325 MG tablet Take 650 mg by mouth every 6 (six) hours as needed for mild pain, fever or headache.    [provider]  ibuprofen (ADVIL,MOTRIN) 200 MG tablet Take 400 mg by mouth daily as needed for moderate pain.    [provider]  oxyCODONE-acetaminophen (PERCOCET/ROXICET) 5-325 MG tablet Take 1 tablet by mouth every 6 (six) hours as needed for moderate pain or severe pain. 05/20/21   Pokhrel, Rebekah ChesterfieldLaxman, MD  pantoprazole (PROTONIX) 40 MG tablet Take 1 tablet (40 mg total) by mouth daily. 05/20/21 06/19/21  Joycelyn DasPokhrel, Laxman, MD    Physical Exam: Vitals:   11/03/21 1600 11/03/21 1615 11/03/21 1630 11/03/21 1645  BP: 130/87 137/82 (!) 141/89 (!) 155/92  Pulse: 97 (!) 45 (!) 53 79  Resp: 15 (!) 21 19 18   Temp:      TempSrc:      SpO2: 98% 94% 97% 97%  Weight:      Height:  Constitutional: NAD, calm, comfortable Vitals:   11/03/21 1600 11/03/21 1615 11/03/21 1630 11/03/21 1645  BP: 130/87 137/82 (!) 141/89 (!) 155/92  Pulse: 97 (!) 45 (!) 53 79  Resp: 15 (!) 21 19 18   Temp:      TempSrc:      SpO2: 98% 94% 97% 97%  Weight:      Height:       Eyes: PERRL, lids and conjunctivae normal ENMT: Mucous membranes are moist. Posterior pharynx clear of any exudate or lesions.Normal dentition.  Neck: normal, supple, no masses, no thyromegaly Respiratory: clear to auscultation  bilaterally, no wheezing, no crackles. Normal respiratory effort. No accessory muscle use.  Cardiovascular: Regular rate and rhythm, no murmurs / rubs / gallops. No extremity edema. 2+ pedal pulses. No carotid bruits.  Abdomen: tenderness on epigastric area, no rebound no guarding, no masses palpated. No hepatosplenomegaly. Bowel sounds positive.  Musculoskeletal: no clubbing / cyanosis. No joint deformity upper and lower extremities. Good ROM, no contractures. Normal muscle tone.  Skin: no rashes, lesions, ulcers. No induration Neurologic: CN 2-12 grossly intact. Sensation intact, DTR normal. Strength 5/5 in all 4.  Psychiatric: Normal judgment and insight. Alert and oriented x 3. Normal mood.     Labs on Admission: I have personally reviewed following labs and imaging studies  CBC: Recent Labs  Lab 11/03/21 1157  WBC 18.0*  NEUTROABS 15.0*  HGB 15.5  HCT 44.4  MCV 94.5  PLT 271   Basic Metabolic Panel: Recent Labs  Lab 11/03/21 1157  NA 141  K 4.0  CL 108  CO2 25  GLUCOSE 99  BUN 14  CREATININE 0.83  CALCIUM 8.7*   GFR: Estimated Creatinine Clearance: 127.7 mL/min (by C-G formula based on SCr of 0.83 mg/dL). Liver Function Tests: Recent Labs  Lab 11/03/21 1157  AST 19  ALT 17  ALKPHOS 66  BILITOT 1.3*  PROT 7.3  ALBUMIN 4.4   Recent Labs  Lab 11/03/21 1157  LIPASE 202*   No results for input(s): AMMONIA in the last 168 hours. Coagulation Profile: No results for input(s): INR, PROTIME in the last 168 hours. Cardiac Enzymes: No results for input(s): CKTOTAL, CKMB, CKMBINDEX, TROPONINI in the last 168 hours. BNP (last 3 results) No results for input(s): PROBNP in the last 8760 hours. HbA1C: No results for input(s): HGBA1C in the last 72 hours. CBG: No results for input(s): GLUCAP in the last 168 hours. Lipid Profile: No results for input(s): CHOL, HDL, LDLCALC, TRIG, CHOLHDL, LDLDIRECT in the last 72 hours. Thyroid Function Tests: No results for  input(s): TSH, T4TOTAL, FREET4, T3FREE, THYROIDAB in the last 72 hours. Anemia Panel: No results for input(s): VITAMINB12, FOLATE, FERRITIN, TIBC, IRON, RETICCTPCT in the last 72 hours. Urine analysis:    Component Value Date/Time   COLORURINE AMBER (A) 05/17/2021 0448   APPEARANCEUR HAZY (A) 05/17/2021 0448   LABSPEC 1.033 (H) 05/17/2021 0448   PHURINE 6.0 05/17/2021 0448   GLUCOSEU NEGATIVE 05/17/2021 0448   HGBUR NEGATIVE 05/17/2021 0448   BILIRUBINUR NEGATIVE 05/17/2021 0448   KETONESUR 80 (A) 05/17/2021 0448   PROTEINUR 30 (A) 05/17/2021 0448   NITRITE NEGATIVE 05/17/2021 0448   LEUKOCYTESUR NEGATIVE 05/17/2021 0448    Radiological Exams on Admission: DG Chest 2 View  Result Date: 11/03/2021 CLINICAL DATA:  Shortness of breath. EXAM: CHEST - 2 VIEW COMPARISON:  None Available. FINDINGS: No consolidation. No visible pleural effusions or pneumothorax. Cardiomediastinal silhouette is within normal limits. No displaced fracture. IMPRESSION: No evidence of  acute cardiopulmonary disease. Electronically Signed   By: Feliberto Harts M.D.   On: 11/03/2021 10:34   CT Abdomen Pelvis W Contrast  Result Date: 11/03/2021 CLINICAL DATA:  Acute abdominal pain.  History of pancreatitis. EXAM: CT ABDOMEN AND PELVIS WITH CONTRAST TECHNIQUE: Multidetector CT imaging of the abdomen and pelvis was performed using the standard protocol following bolus administration of intravenous contrast. RADIATION DOSE REDUCTION: This exam was performed according to the departmental dose-optimization program which includes automated exposure control, adjustment of the mA and/or kV according to patient size and/or use of iterative reconstruction technique. CONTRAST:  OMNIPAQUE IOHEXOL 300 MG/ML  SOLN COMPARISON:  05/17/2021. FINDINGS: Lower chest: Clear lung bases. Hepatobiliary: Decreased attenuation of the liver consistent with fatty infiltration. Liver normal size. No mass or focal lesion. Normal gallbladder. No  bile duct dilation. Pancreas: Mild peripancreatic inflammatory stranding and haziness. Small amount of fluid lies adjacent to the pancreatic head and duodenum abutting the anterior right pararenal space. Pancreas shows homogeneous enhancement. No mass or duct dilation. No peripancreatic fluid collection. Spleen: Normal in size without focal abnormality. Adrenals/Urinary Tract: Adrenal glands are unremarkable. Kidneys are normal, without renal calculi, focal lesion, or hydronephrosis. Bladder is unremarkable. Stomach/Bowel: Normal stomach. Mild reactive wall thickening along the second and third portions of the duodenum. Small bowel and colon are normal in caliber. No wall thickening or inflammatory changes. Vascular/Lymphatic: Portal vein, splenic vein and superior mesenteric veins are patent. No enlarged lymph nodes. Reproductive: Unremarkable. Other: None. Musculoskeletal: No acute or significant osseous findings. IMPRESSION: 1. Acute uncomplicated pancreatitis. No evidence of pancreatic necrosis. No peripancreatic fluid collection. 2. Hepatic steatosis. Electronically Signed   By: Amie Portland M.D.   On: 11/03/2021 14:21    EKG: Independently reviewed.  Sinus bradycardia, no acute ST changes.  Assessment/Plan Active Problems:   Hepatic steatosis   Acute pancreatitis   Sinus bradycardia   Pancreatitis  (please populate well all problems here in Problem List. (For example, if patient is on BP meds at home and you resume or decide to hold them, it is a problem that needs to be her. Same for CAD, COPD, HLD and so on)  Recurrent pancreatitis, acute -Given that patient has been alcohol free for last month, the episode of pancreatitis probably has other etiology.  Ordered MRI to rule out divisum, and lipid panel.  As patient reported that patient's father has hyperlipidemia before age of 29. -Patient feels too sick to start any diet, will keep him n.p.o. for tonight, maintenance IV fluid, alternate  Toradol and Dilaudid for pain control.  Leukocytosis -Probably reactive to pancreatitis and there might be also an element of hemoconcentration.  IV hydration and repeat CBC tomorrow.  GERD -Poorly controlled, continue PPI  Sinus bradycardia -Stable.  DVT prophylaxis: SCD Code Status: Full code Family Communication: None at bedside Disposition Plan: Expect 1 to 2 days hospital stay Consults called: None Admission status: MedSurg observation   Emeline General MD [facility] Pager [phone]  11/03/2021, 5:08 PM

## 2021-11-03 NOTE — ED Provider Notes (Signed)
[facility] DEPT Provider Note   CSN: [id number] Arrival date & time: 11/03/21  1003     History  Chief Complaint  Patient presents with   Abdominal Pain   Emesis   Shortness of Breath    Jeffrey Mathis is a 41 y.o. male.  HPI  41 year old male with past medical history of appendectomy and pancreatitis presents emergency department with upper abdominal pain.  Patient states last night he developed epigastric abdominal pain.  This is associated with nausea/vomiting, nonbloody emesis.  A couple hours prior to arrival the upper abdominal pain became severe.  He had multiple episodes of pancreatitis before requiring hospitalization.  Currently does not have any outpatient GI follow-up.  Previous pancreatitis episodes attributed to alcohol use.  Denies any fever but he endorses chills.  No ongoing diarrhea.  Home Medications Prior to Admission medications   Medication Sig Start Date End Date Taking? Authorizing Provider  acetaminophen (TYLENOL) 325 MG tablet Take 650 mg by mouth every 6 (six) hours as needed for mild pain, fever or headache.    [provider]  ibuprofen (ADVIL,MOTRIN) 200 MG tablet Take 400 mg by mouth daily as needed for moderate pain.    [provider]  oxyCODONE-acetaminophen (PERCOCET/ROXICET) 5-325 MG tablet Take 1 tablet by mouth every 6 (six) hours as needed for moderate pain or severe pain. 05/20/21   Pokhrel, Corrie Mckusick, MD  pantoprazole (PROTONIX) 40 MG tablet Take 1 tablet (40 mg total) by mouth daily. 05/20/21 06/19/21  Flora Lipps, MD      Allergies    Patient has no known allergies.    Review of Systems   Review of Systems  Constitutional:  Positive for appetite change and chills. Negative for fever.  Respiratory:  Negative for shortness of breath.   Cardiovascular:  Negative for chest pain.  Gastrointestinal:  Positive for abdominal pain, nausea and vomiting. Negative for diarrhea.  Genitourinary:   Negative for flank pain.  Skin:  Negative for rash.  Neurological:  Negative for headaches.   Physical Exam Updated Vital Signs BP (!) 141/88   Pulse (!) 48   Temp 98.1 F (36.7 C) (Oral)   Resp 16   Ht 6' (1.829 m)   Wt 74.8 kg   SpO2 98%   BMI 22.38 kg/m  Physical Exam Vitals and nursing note reviewed.  Constitutional:      General: He is not in acute distress.    Appearance: Normal appearance.  HENT:     Head: Normocephalic.     Mouth/Throat:     Mouth: Mucous membranes are moist.  Cardiovascular:     Rate and Rhythm: Normal rate.  Pulmonary:     Effort: Pulmonary effort is normal. No respiratory distress.  Abdominal:     General: There is no distension.     Palpations: Abdomen is soft.     Tenderness: There is abdominal tenderness in the right upper quadrant, epigastric area and left upper quadrant. There is no guarding or rebound. Negative signs include Murphy's sign.  Skin:    General: Skin is warm.  Neurological:     Mental Status: He is alert and oriented to person, place, and time. Mental status is at baseline.  Psychiatric:        Mood and Affect: Mood normal.    ED Results / Procedures / Treatments   Labs (all labs ordered are listed, but only abnormal results are displayed) Labs Reviewed  CBC WITH DIFFERENTIAL/PLATELET - Abnormal; Notable for the  following components:      Result Value   WBC 18.0 (*)    Neutro Abs 15.0 (*)    Monocytes Absolute 1.2 (*)    All other components within normal limits  COMPREHENSIVE METABOLIC PANEL - Abnormal; Notable for the following components:   Calcium 8.7 (*)    Total Bilirubin 1.3 (*)    All other components within normal limits  LIPASE, BLOOD - Abnormal; Notable for the following components:   Lipase 202 (*)    All other components within normal limits  URINALYSIS, ROUTINE W REFLEX MICROSCOPIC    EKG EKG Interpretation  Date/Time:  Friday November 03 2021 12:06:34 EDT Ventricular Rate:  45 PR  Interval:  105 QRS Duration: 97 QT Interval:  468 QTC Calculation: 405 R Axis:   81 Text Interpretation: Sinus bradycardia Short PR interval Confirmed by Lavenia Atlas [phone]) on 11/03/2021 12:56:42 PM  Radiology DG Chest 2 View  Result Date: 11/03/2021 CLINICAL DATA:  Shortness of breath. EXAM: CHEST - 2 VIEW COMPARISON:  None Available. FINDINGS: No consolidation. No visible pleural effusions or pneumothorax. Cardiomediastinal silhouette is within normal limits. No displaced fracture. IMPRESSION: No evidence of acute cardiopulmonary disease. Electronically Signed   By: Margaretha Sheffield M.D.   On: 11/03/2021 10:34    Procedures Procedures    Medications Ordered in ED Medications  sodium chloride 0.9 % bolus 1,000 mL (1,000 mLs Intravenous New Bag/Given 11/03/21 1237)  ondansetron (ZOFRAN) injection 4 mg (4 mg Intravenous Given 11/03/21 1234)  morphine (PF) 4 MG/ML injection 4 mg (4 mg Intravenous Given 11/03/21 1235)    ED Course/ Medical Decision Making/ A&P                           Medical Decision Making Amount and/or Complexity of Data Reviewed Labs: ordered. Radiology: ordered.  Risk Prescription drug management.   41 year old male presents emergency department with upper abdominal pain, nausea/vomiting.  History of pancreatitis in the past.  Vital signs are stable on arrival, he is afebrile.  Very tender in the upper abdomen.  Blood work shows a leukocytosis, an elevated lipase.  CT scan confirms pancreatitis without complications.  After multiple rounds of IV medication patient is still not pain controlled.  Plan for medical admission and further treatment.  Patients evaluation and results requires admission for further treatment and care.  Spoke with hospitalist, reviewed patient's ED course and they accept admission.  Patient agrees with admission plan, offers no new complaints and is stable/unchanged at time of admit.        Final Clinical Impression(s) / ED  Diagnoses Final diagnoses:  None    Rx / DC Orders ED Discharge Orders     None         Lorelle Gibbs, DO 11/03/21 1644

## 2021-11-04 ENCOUNTER — Inpatient Hospital Stay (HOSPITAL_COMMUNITY): Payer: Non-veteran care

## 2021-11-04 DIAGNOSIS — K760 Fatty (change of) liver, not elsewhere classified: Secondary | ICD-10-CM | POA: Diagnosis present

## 2021-11-04 DIAGNOSIS — K219 Gastro-esophageal reflux disease without esophagitis: Secondary | ICD-10-CM | POA: Diagnosis present

## 2021-11-04 DIAGNOSIS — R001 Bradycardia, unspecified: Secondary | ICD-10-CM

## 2021-11-04 DIAGNOSIS — Z79899 Other long term (current) drug therapy: Secondary | ICD-10-CM | POA: Diagnosis not present

## 2021-11-04 DIAGNOSIS — Z818 Family history of other mental and behavioral disorders: Secondary | ICD-10-CM | POA: Diagnosis not present

## 2021-11-04 DIAGNOSIS — D72829 Elevated white blood cell count, unspecified: Secondary | ICD-10-CM | POA: Diagnosis not present

## 2021-11-04 DIAGNOSIS — K859 Acute pancreatitis without necrosis or infection, unspecified: Secondary | ICD-10-CM | POA: Diagnosis present

## 2021-11-04 DIAGNOSIS — F1010 Alcohol abuse, uncomplicated: Secondary | ICD-10-CM | POA: Diagnosis present

## 2021-11-04 DIAGNOSIS — F4310 Post-traumatic stress disorder, unspecified: Secondary | ICD-10-CM | POA: Diagnosis present

## 2021-11-04 DIAGNOSIS — F17210 Nicotine dependence, cigarettes, uncomplicated: Secondary | ICD-10-CM | POA: Diagnosis present

## 2021-11-04 DIAGNOSIS — K852 Alcohol induced acute pancreatitis without necrosis or infection: Secondary | ICD-10-CM | POA: Diagnosis present

## 2021-11-04 LAB — CBC
HCT: 39.6 % (ref 39.0–52.0)
Hemoglobin: 13.7 g/dL (ref 13.0–17.0)
MCH: 33.1 pg (ref 26.0–34.0)
MCHC: 34.6 g/dL (ref 30.0–36.0)
MCV: 95.7 fL (ref 80.0–100.0)
Platelets: 192 10*3/uL (ref 150–400)
RBC: 4.14 MIL/uL — ABNORMAL LOW (ref 4.22–5.81)
RDW: 12.2 % (ref 11.5–15.5)
WBC: 13.3 10*3/uL — ABNORMAL HIGH (ref 4.0–10.5)
nRBC: 0 % (ref 0.0–0.2)

## 2021-11-04 LAB — LIPASE, BLOOD: Lipase: 394 U/L — ABNORMAL HIGH (ref 11–51)

## 2021-11-04 MED ORDER — GADOBUTROL 1 MMOL/ML IV SOLN
7.0000 mL | Freq: Once | INTRAVENOUS | Status: AC | PRN
  Administered 2021-11-04: 7 mL via INTRAVENOUS

## 2021-11-04 MED ORDER — HYDROMORPHONE HCL 1 MG/ML IJ SOLN
0.5000 mg | INTRAMUSCULAR | Status: DC | PRN
  Administered 2021-11-04 – 2021-11-05 (×6): 1 mg via INTRAVENOUS
  Filled 2021-11-04 (×6): qty 1

## 2021-11-04 NOTE — Progress Notes (Signed)
PROGRESS NOTE    Jeffrey Mathis  [id number] DOB: 10-25-1980 DOA: 11/03/2021 PCP: Myrlene Broker, MD   Brief Narrative:  41 y.o. male with medical history significant of recurrent pancreatitis, history of alcohol abuse, fatty liver, sinus bradycardia, GERD presented with sudden onset of epigastric pain nauseous vomiting.  On presentation, lipase was 202, WBC of 18.  CT of abdomen and pelvis with contrast showed acute uncomplicated pancreatitis with no evidence of pancreatic necrosis, no mass or duct dilatation.  Assessment & Plan:   Acute recurrent pancreatitis -Most likely alcohol induced -CT findings as above.  Lipase 202 on presentation. -Continue IV fluids, pain management, antiemetics as needed.  Currently n.p.o. and patient is hesitant to start diet yet.  Can start him on a clear liquid diet later today if patient agreeable -Triglyceride 185  Leukocytosis -Possibly reactive.  Improving.  Monitor  GERD -Continue PPI  Sinus bradycardia -Stable   DVT prophylaxis: SCDs Code Status: Full Family Communication: None at bedside Disposition Plan: Status is: Inpatient Remains inpatient appropriate because: Of severity of illness    Consultants: None  Procedures: None  Antimicrobials: None   Subjective: Patient seen and examined at bedside.  Still having intermittent abdominal pain but slightly better.  Does not want to eat yet.  Has intermittent nausea.  No overnight fever or chest pain reported.  Objective: Vitals:   11/03/21 2159 11/04/21 0151 11/04/21 0621 11/04/21 0951  BP: (!) 145/98 123/66 138/82 134/86  Pulse: (!) 53 (!) 49 (!) 44 91  Resp: 18 18 16 17   Temp: 98.1 F (36.7 C) 98.1 F (36.7 C) 98.1 F (36.7 C) 98.2 F (36.8 C)  TempSrc: Oral Oral Oral Oral  SpO2: 99% 100% 96% 98%  Weight:      Height:        Intake/Output Summary (Last 24 hours) at 11/04/2021 1123 Last data filed at 11/04/2021 0600 Gross per 24 hour  Intake 1536.57 ml   Output --  Net 1536.57 ml   Filed Weights   11/03/21 1017  Weight: 74.8 kg    Examination:  General exam: Appears calm and comfortable.  Currently on room air. Respiratory system: Bilateral decreased breath sounds at bases Cardiovascular system: S1 & S2 heard, intermittently bradycardic  gastrointestinal system: Abdomen is nondistended, soft and tender in the epigastric/periumbilical regions.  No rebound tenderness. Normal bowel sounds heard. Extremities: No cyanosis, clubbing, edema  Central nervous system: Alert and oriented. No focal neurological deficits. Moving extremities Skin: No rashes, lesions or ulcers Psychiatry: Judgement and insight appear normal. Mood & affect appropriate.     Data Reviewed: I have personally reviewed following labs and imaging studies  CBC: Recent Labs  Lab 11/03/21 1157 11/04/21 0536  WBC 18.0* 13.3*  NEUTROABS 15.0*  --   HGB 15.5 13.7  HCT 44.4 39.6  MCV 94.5 95.7  PLT 271 192   Basic Metabolic Panel: Recent Labs  Lab 11/03/21 1157  NA 141  K 4.0  CL 108  CO2 25  GLUCOSE 99  BUN 14  CREATININE 0.83  CALCIUM 8.7*   GFR: Estimated Creatinine Clearance: 127.7 mL/min (by C-G formula based on SCr of 0.83 mg/dL). Liver Function Tests: Recent Labs  Lab 11/03/21 1157  AST 19  ALT 17  ALKPHOS 66  BILITOT 1.3*  PROT 7.3  ALBUMIN 4.4   Recent Labs  Lab 11/03/21 1157 11/04/21 0536  LIPASE 202* 394*   No results for input(s): AMMONIA in the last 168 hours. Coagulation Profile: No results for  input(s): INR, PROTIME in the last 168 hours. Cardiac Enzymes: No results for input(s): CKTOTAL, CKMB, CKMBINDEX, TROPONINI in the last 168 hours. BNP (last 3 results) No results for input(s): PROBNP in the last 8760 hours. HbA1C: No results for input(s): HGBA1C in the last 72 hours. CBG: No results for input(s): GLUCAP in the last 168 hours. Lipid Profile: Recent Labs    11/03/21 1157  CHOL 125  HDL 40*  LDLCALC 48  TRIG  185*  CHOLHDL 3.1   Thyroid Function Tests: No results for input(s): TSH, T4TOTAL, FREET4, T3FREE, THYROIDAB in the last 72 hours. Anemia Panel: No results for input(s): VITAMINB12, FOLATE, FERRITIN, TIBC, IRON, RETICCTPCT in the last 72 hours. Sepsis Labs: No results for input(s): PROCALCITON, LATICACIDVEN in the last 168 hours.  No results found for this or any previous visit (from the past 240 hour(s)).       Radiology Studies: DG Chest 2 View  Result Date: 11/03/2021 CLINICAL DATA:  Shortness of breath. EXAM: CHEST - 2 VIEW COMPARISON:  None Available. FINDINGS: No consolidation. No visible pleural effusions or pneumothorax. Cardiomediastinal silhouette is within normal limits. No displaced fracture. IMPRESSION: No evidence of acute cardiopulmonary disease. Electronically Signed   By: Feliberto Harts M.D.   On: 11/03/2021 10:34   CT Abdomen Pelvis W Contrast  Result Date: 11/03/2021 CLINICAL DATA:  Acute abdominal pain.  History of pancreatitis. EXAM: CT ABDOMEN AND PELVIS WITH CONTRAST TECHNIQUE: Multidetector CT imaging of the abdomen and pelvis was performed using the standard protocol following bolus administration of intravenous contrast. RADIATION DOSE REDUCTION: This exam was performed according to the departmental dose-optimization program which includes automated exposure control, adjustment of the mA and/or kV according to patient size and/or use of iterative reconstruction technique. CONTRAST:  OMNIPAQUE IOHEXOL 300 MG/ML  SOLN COMPARISON:  05/17/2021. FINDINGS: Lower chest: Clear lung bases. Hepatobiliary: Decreased attenuation of the liver consistent with fatty infiltration. Liver normal size. No mass or focal lesion. Normal gallbladder. No bile duct dilation. Pancreas: Mild peripancreatic inflammatory stranding and haziness. Small amount of fluid lies adjacent to the pancreatic head and duodenum abutting the anterior right pararenal space. Pancreas shows homogeneous  enhancement. No mass or duct dilation. No peripancreatic fluid collection. Spleen: Normal in size without focal abnormality. Adrenals/Urinary Tract: Adrenal glands are unremarkable. Kidneys are normal, without renal calculi, focal lesion, or hydronephrosis. Bladder is unremarkable. Stomach/Bowel: Normal stomach. Mild reactive wall thickening along the second and third portions of the duodenum. Small bowel and colon are normal in caliber. No wall thickening or inflammatory changes. Vascular/Lymphatic: Portal vein, splenic vein and superior mesenteric veins are patent. No enlarged lymph nodes. Reproductive: Unremarkable. Other: None. Musculoskeletal: No acute or significant osseous findings. IMPRESSION: 1. Acute uncomplicated pancreatitis. No evidence of pancreatic necrosis. No peripancreatic fluid collection. 2. Hepatic steatosis. Electronically Signed   By: Amie Portland M.D.   On: 11/03/2021 14:21        Scheduled Meds:  pantoprazole  40 mg Oral Daily   Continuous Infusions:  sodium chloride 125 mL/hr at 11/04/21 0308          Glade Lloyd, MD [facility] 11/04/2021, 11:23 AM

## 2021-11-05 LAB — CBC WITH DIFFERENTIAL/PLATELET
Abs Immature Granulocytes: 0.03 10*3/uL (ref 0.00–0.07)
Basophils Absolute: 0 10*3/uL (ref 0.0–0.1)
Basophils Relative: 0 %
Eosinophils Absolute: 0.2 10*3/uL (ref 0.0–0.5)
Eosinophils Relative: 2 %
HCT: 37.7 % — ABNORMAL LOW (ref 39.0–52.0)
Hemoglobin: 13.1 g/dL (ref 13.0–17.0)
Immature Granulocytes: 0 %
Lymphocytes Relative: 16 %
Lymphs Abs: 1.6 10*3/uL (ref 0.7–4.0)
MCH: 32.6 pg (ref 26.0–34.0)
MCHC: 34.7 g/dL (ref 30.0–36.0)
MCV: 93.8 fL (ref 80.0–100.0)
Monocytes Absolute: 0.9 10*3/uL (ref 0.1–1.0)
Monocytes Relative: 9 %
Neutro Abs: 7.1 10*3/uL (ref 1.7–7.7)
Neutrophils Relative %: 73 %
Platelets: 178 10*3/uL (ref 150–400)
RBC: 4.02 MIL/uL — ABNORMAL LOW (ref 4.22–5.81)
RDW: 11.9 % (ref 11.5–15.5)
WBC: 9.8 10*3/uL (ref 4.0–10.5)
nRBC: 0 % (ref 0.0–0.2)

## 2021-11-05 LAB — COMPREHENSIVE METABOLIC PANEL
ALT: 11 U/L (ref 0–44)
AST: 15 U/L (ref 15–41)
Albumin: 3.4 g/dL — ABNORMAL LOW (ref 3.5–5.0)
Alkaline Phosphatase: 54 U/L (ref 38–126)
Anion gap: 5 (ref 5–15)
BUN: 6 mg/dL (ref 6–20)
CO2: 26 mmol/L (ref 22–32)
Calcium: 8.6 mg/dL — ABNORMAL LOW (ref 8.9–10.3)
Chloride: 106 mmol/L (ref 98–111)
Creatinine, Ser: 0.61 mg/dL (ref 0.61–1.24)
GFR, Estimated: >60 mL/min (ref >60)
Glucose, Bld: 92 mg/dL (ref 70–99)
Potassium: 3.6 mmol/L (ref 3.5–5.1)
Sodium: 137 mmol/L (ref 135–145)
Total Bilirubin: 1.2 mg/dL (ref 0.3–1.2)
Total Protein: 6.1 g/dL — ABNORMAL LOW (ref 6.5–8.1)

## 2021-11-05 LAB — MAGNESIUM: Magnesium: 1.8 mg/dL (ref 1.7–2.4)

## 2021-11-05 MED ORDER — OXYCODONE HCL 5 MG PO TABS
5.0000 mg | ORAL_TABLET | ORAL | Status: DC | PRN
  Administered 2021-11-05 (×4): 10 mg via ORAL
  Filled 2021-11-05 (×4): qty 2

## 2021-11-05 NOTE — Progress Notes (Signed)
PROGRESS NOTE    Jeffrey Mathis  ZDG:[id number] DOB: 09/27/1980 DOA: 11/03/2021 PCP: Myrlene Broker, MD   Brief Narrative:  41 y.o. male with medical history significant of recurrent pancreatitis, history of alcohol abuse, fatty liver, sinus bradycardia, GERD presented with sudden onset of epigastric pain nauseous vomiting.  On presentation, lipase was 202, WBC of 18.  CT of abdomen and pelvis with contrast showed acute uncomplicated pancreatitis with no evidence of pancreatic necrosis, no mass or duct dilatation.  Assessment & Plan:   Acute recurrent pancreatitis -Most likely alcohol induced -CT findings as above.  Lipase 202 on presentation. -Continue IV fluids, pain management, antiemetics as needed.  Currently on clear liquid diet.  Abdominal pain improving.  Advance diet to full liquid diet today. -Triglyceride 185  Leukocytosis -Possibly reactive.  Resolved  GERD -Continue PPI  Sinus bradycardia -Stable   DVT prophylaxis: SCDs Code Status: Full Family Communication: None at bedside Disposition Plan: Status is: Inpatient Remains inpatient appropriate because: Of severity of illness    Consultants: None  Procedures: None  Antimicrobials: None   Subjective: Patient seen and examined at bedside.  No overnight fever or vomiting reported.  Tolerated clear liquid diet but has intermittent nausea.  Still complains of significant abdominal pain requiring IV Dilaudid.  Wants to try full liquid diet today denies worsening shortness of breath Objective: Vitals:   11/04/21 0951 11/04/21 1316 11/04/21 2037 11/05/21 0546  BP: 134/86 127/83 (!) 138/91 137/82  Pulse: 91 (!) 40 (!) 55 (!) 57  Resp: 17 18 16 16   Temp: 98.2 F (36.8 C) 98 F (36.7 C) 98.9 F (37.2 C) 98 F (36.7 C)  TempSrc: Oral Oral Oral   SpO2: 98% 99% 97% 100%  Weight:      Height:        Intake/Output Summary (Last 24 hours) at 11/05/2021 0738 Last data filed at 11/05/2021 0435 Gross per 24  hour  Intake 3946.39 ml  Output --  Net 3946.39 ml    Filed Weights   11/03/21 1017  Weight: 74.8 kg    Examination:  General: On room air.  No distress ENT/neck: No thyromegaly.  JVD is not elevated  respiratory: Decreased breath sounds at bases bilaterally with some crackles; no wheezing CVS: S1-S2 heard, rate controlled Abdominal: Soft, tender around umbilical and epigastric regions, no rebound tenderness; slightly distended; no organomegaly, normal bowel sounds are heard Extremities: No lower extremity edema; no cyanosis  CNS: Awake and alert.  No focal neurologic deficit.  Moves extremities Lymph: No obvious lymphadenopathy Skin: No obvious ecchymosis/lesions  psych: Affect, judgment and mood are normal  musculoskeletal: No obvious joint swelling/deformity     Data Reviewed: I have personally reviewed following labs and imaging studies  CBC: Recent Labs  Lab 11/03/21 1157 11/04/21 0536 11/05/21 0521  WBC 18.0* 13.3* 9.8  NEUTROABS 15.0*  --  7.1  HGB 15.5 13.7 13.1  HCT 44.4 39.6 37.7*  MCV 94.5 95.7 93.8  PLT 271 192 178    Basic Metabolic Panel: Recent Labs  Lab 11/03/21 1157 11/05/21 0521  NA 141 137  K 4.0 3.6  CL 108 106  CO2 25 26  GLUCOSE 99 92  BUN 14 6  CREATININE 0.83 0.61  CALCIUM 8.7* 8.6*  MG  --  1.8    GFR: Estimated Creatinine Clearance: 132.5 mL/min (by C-G formula based on SCr of 0.61 mg/dL). Liver Function Tests: Recent Labs  Lab 11/03/21 1157 11/05/21 0521  AST 19 15  ALT  17 11  ALKPHOS 66 54  BILITOT 1.3* 1.2  PROT 7.3 6.1*  ALBUMIN 4.4 3.4*    Recent Labs  Lab 11/03/21 1157 11/04/21 0536  LIPASE 202* 394*    No results for input(s): AMMONIA in the last 168 hours. Coagulation Profile: No results for input(s): INR, PROTIME in the last 168 hours. Cardiac Enzymes: No results for input(s): CKTOTAL, CKMB, CKMBINDEX, TROPONINI in the last 168 hours. BNP (last 3 results) No results for input(s): PROBNP in the  last 8760 hours. HbA1C: No results for input(s): HGBA1C in the last 72 hours. CBG: No results for input(s): GLUCAP in the last 168 hours. Lipid Profile: Recent Labs    11/03/21 1157  CHOL 125  HDL 40*  LDLCALC 48  TRIG [id number]*  CHOLHDL 3.1    Thyroid Function Tests: No results for input(s): TSH, T4TOTAL, FREET4, T3FREE, THYROIDAB in the last 72 hours. Anemia Panel: No results for input(s): VITAMINB12, FOLATE, FERRITIN, TIBC, IRON, RETICCTPCT in the last 72 hours. Sepsis Labs: No results for input(s): PROCALCITON, LATICACIDVEN in the last 168 hours.  No results found for this or any previous visit (from the past 240 hour(s)).       Radiology Studies: DG Chest 2 View  Result Date: 11/03/2021 CLINICAL DATA:  Shortness of breath. EXAM: CHEST - 2 VIEW COMPARISON:  None Available. FINDINGS: No consolidation. No visible pleural effusions or pneumothorax. Cardiomediastinal silhouette is within normal limits. No displaced fracture. IMPRESSION: No evidence of acute cardiopulmonary disease. Electronically Signed   By: Feliberto HartsFrederick S Jones M.D.   On: 11/03/2021 10:34   CT Abdomen Pelvis W Contrast  Result Date: 11/03/2021 CLINICAL DATA:  Acute abdominal pain.  History of pancreatitis. EXAM: CT ABDOMEN AND PELVIS WITH CONTRAST TECHNIQUE: Multidetector CT imaging of the abdomen and pelvis was performed using the standard protocol following bolus administration of intravenous contrast. RADIATION DOSE REDUCTION: This exam was performed according to the departmental dose-optimization program which includes automated exposure control, adjustment of the mA and/or kV according to patient size and/or use of iterative reconstruction technique. CONTRAST:  100mL OMNIPAQUE IOHEXOL 300 MG/ML  SOLN COMPARISON:  05/17/2021. FINDINGS: Lower chest: Clear lung bases. Hepatobiliary: Decreased attenuation of the liver consistent with fatty infiltration. Liver normal size. No mass or focal lesion. Normal gallbladder. No  bile duct dilation. Pancreas: Mild peripancreatic inflammatory stranding and haziness. Small amount of fluid lies adjacent to the pancreatic head and duodenum abutting the anterior right pararenal space. Pancreas shows homogeneous enhancement. No mass or duct dilation. No peripancreatic fluid collection. Spleen: Normal in size without focal abnormality. Adrenals/Urinary Tract: Adrenal glands are unremarkable. Kidneys are normal, without renal calculi, focal lesion, or hydronephrosis. Bladder is unremarkable. Stomach/Bowel: Normal stomach. Mild reactive wall thickening along the second and third portions of the duodenum. Small bowel and colon are normal in caliber. No wall thickening or inflammatory changes. Vascular/Lymphatic: Portal vein, splenic vein and superior mesenteric veins are patent. No enlarged lymph nodes. Reproductive: Unremarkable. Other: None. Musculoskeletal: No acute or significant osseous findings. IMPRESSION: 1. Acute uncomplicated pancreatitis. No evidence of pancreatic necrosis. No peripancreatic fluid collection. 2. Hepatic steatosis. Electronically Signed   By: Amie Portlandavid  Ormond M.D.   On: 11/03/2021 14:21   MR 3D Recon At Scanner  Result Date: 11/04/2021 CLINICAL DATA:  41 year old male with history of severe acute pancreatitis. EXAM: MRI ABDOMEN WITHOUT AND WITH CONTRAST (INCLUDING MRCP) TECHNIQUE: Multiplanar multisequence MR imaging of the abdomen was performed both before and after the administration of intravenous contrast. Heavily T2-weighted images  of the biliary and pancreatic ducts were obtained, and three-dimensional MRCP images were rendered by post processing. CONTRAST:  30mL GADAVIST GADOBUTROL 1 MMOL/ML IV SOLN COMPARISON:  No prior abdominal MRI. CT the abdomen and pelvis 11/03/2021. FINDINGS: Lower chest: Unremarkable. Hepatobiliary: Mild diffuse loss of signal intensity throughout the hepatic parenchyma on out of phase dual echo images, indicative of a background of mild  hepatic steatosis. No suspicious cystic or solid hepatic lesions. No intra or extrahepatic biliary ductal dilatation noted on MRCP images. Common bile duct measures 4 mm in the porta hepatis. No filling defect in the common bile duct on MRCP images to suggest the presence of choledocholithiasis. Gallbladder is unremarkable in appearance. Pancreas: No pancreatic mass. No pancreatic ductal dilatation noted on MRCP images. Increased T2 signal intensity surrounding the pancreas, indicative of acute inflammation. Pancreatic parenchyma appears to enhance normally. No well organized peripancreatic fluid collection identified to suggest pancreatic pseudocyst at this time. Spleen:  Unremarkable. Adrenals/Urinary Tract: Bilateral kidneys and bilateral adrenal glands are normal in appearance. No hydroureteronephrosis in the visualized portions of the abdomen. Bilateral adrenal glands are normal in appearance. Stomach/Bowel: Visualized portions are unremarkable. Vascular/Lymphatic: No aneurysm identified in the visualized abdominal vasculature. Splenic and portal veins are patent. No lymphadenopathy noted in the abdomen. Other: Trace volume of ascites. T2 hyperintense fluid in the retroperitoneum, most evident adjacent to the pancreas and tracking into the root of the small bowel mesentery. Musculoskeletal: No aggressive appearing lytic or blastic lesions are noted in the visualized portions of the skeleton. IMPRESSION: 1. Imaging findings are compatible with acute uncomplicated pancreatitis, as detailed above. 2. Hepatic steatosis. 3. No choledocholithiasis or findings of biliary tract obstruction or pancreatic duct obstruction. Electronically Signed   By: Trudie Reed M.D.   On: 11/04/2021 11:59   MR ABDOMEN MRCP W WO CONTAST  Result Date: 11/04/2021 CLINICAL DATA:  41 year old male with history of severe acute pancreatitis. EXAM: MRI ABDOMEN WITHOUT AND WITH CONTRAST (INCLUDING MRCP) TECHNIQUE: Multiplanar  multisequence MR imaging of the abdomen was performed both before and after the administration of intravenous contrast. Heavily T2-weighted images of the biliary and pancreatic ducts were obtained, and three-dimensional MRCP images were rendered by post processing. CONTRAST:  35mL GADAVIST GADOBUTROL 1 MMOL/ML IV SOLN COMPARISON:  No prior abdominal MRI. CT the abdomen and pelvis 11/03/2021. FINDINGS: Lower chest: Unremarkable. Hepatobiliary: Mild diffuse loss of signal intensity throughout the hepatic parenchyma on out of phase dual echo images, indicative of a background of mild hepatic steatosis. No suspicious cystic or solid hepatic lesions. No intra or extrahepatic biliary ductal dilatation noted on MRCP images. Common bile duct measures 4 mm in the porta hepatis. No filling defect in the common bile duct on MRCP images to suggest the presence of choledocholithiasis. Gallbladder is unremarkable in appearance. Pancreas: No pancreatic mass. No pancreatic ductal dilatation noted on MRCP images. Increased T2 signal intensity surrounding the pancreas, indicative of acute inflammation. Pancreatic parenchyma appears to enhance normally. No well organized peripancreatic fluid collection identified to suggest pancreatic pseudocyst at this time. Spleen:  Unremarkable. Adrenals/Urinary Tract: Bilateral kidneys and bilateral adrenal glands are normal in appearance. No hydroureteronephrosis in the visualized portions of the abdomen. Bilateral adrenal glands are normal in appearance. Stomach/Bowel: Visualized portions are unremarkable. Vascular/Lymphatic: No aneurysm identified in the visualized abdominal vasculature. Splenic and portal veins are patent. No lymphadenopathy noted in the abdomen. Other: Trace volume of ascites. T2 hyperintense fluid in the retroperitoneum, most evident adjacent to the pancreas and tracking into the  root of the small bowel mesentery. Musculoskeletal: No aggressive appearing lytic or blastic  lesions are noted in the visualized portions of the skeleton. IMPRESSION: 1. Imaging findings are compatible with acute uncomplicated pancreatitis, as detailed above. 2. Hepatic steatosis. 3. No choledocholithiasis or findings of biliary tract obstruction or pancreatic duct obstruction. Electronically Signed   By: Trudie Reed M.D.   On: 11/04/2021 11:59        Scheduled Meds:  pantoprazole  40 mg Oral Daily   Continuous Infusions:  sodium chloride 125 mL/hr at 11/05/21 0434          Glade Lloyd, MD [facility] 11/05/2021, 7:38 AM

## 2021-11-06 LAB — COMPREHENSIVE METABOLIC PANEL
ALT: 10 U/L (ref 0–44)
AST: 13 U/L — ABNORMAL LOW (ref 15–41)
Albumin: 3 g/dL — ABNORMAL LOW (ref 3.5–5.0)
Alkaline Phosphatase: 49 U/L (ref 38–126)
Anion gap: 3 — ABNORMAL LOW (ref 5–15)
BUN: <5 mg/dL — ABNORMAL LOW (ref 6–20)
CO2: 27 mmol/L (ref 22–32)
Calcium: 8.2 mg/dL — ABNORMAL LOW (ref 8.9–10.3)
Chloride: 109 mmol/L (ref 98–111)
Creatinine, Ser: 0.77 mg/dL (ref 0.61–1.24)
GFR, Estimated: >60 mL/min (ref >60)
Glucose, Bld: 130 mg/dL — ABNORMAL HIGH (ref 70–99)
Potassium: 3.9 mmol/L (ref 3.5–5.1)
Sodium: 139 mmol/L (ref 135–145)
Total Bilirubin: 0.6 mg/dL (ref 0.3–1.2)
Total Protein: 5.6 g/dL — ABNORMAL LOW (ref 6.5–8.1)

## 2021-11-06 LAB — MAGNESIUM: Magnesium: 1.8 mg/dL (ref 1.7–2.4)

## 2021-11-06 MED ORDER — OXYCODONE HCL 5 MG PO TABS: 5.0000 mg | ORAL_TABLET | Freq: Four times a day (QID) | ORAL | 0 refills | Status: DC | PRN

## 2021-11-06 MED ORDER — PANTOPRAZOLE SODIUM 40 MG PO TBEC: 40.0000 mg | DELAYED_RELEASE_TABLET | Freq: Every day | ORAL | 0 refills | Status: DC

## 2021-11-06 NOTE — Plan of Care (Signed)
PIV removed. DC paperwork reviewed with pt. Pt ambulated to hospital exit.   Problem: Education: Goal: Knowledge of Pancreatitis treatment and prevention will improve Outcome: Completed/Met   Problem: Health Behavior/Discharge Planning: Goal: Ability to formulate a plan to maintain an alcohol-free life will improve Outcome: Completed/Met   Problem: Nutritional: Goal: Ability to achieve adequate nutritional intake will improve Outcome: Completed/Met   Problem: Clinical Measurements: Goal: Complications related to the disease process, condition or treatment will be avoided or minimized Outcome: Completed/Met   Problem: Education: Goal: Knowledge of General Education information will improve Description: Including pain rating scale, medication(s)/side effects and non-pharmacologic comfort measures Outcome: Completed/Met   Problem: Health Behavior/Discharge Planning: Goal: Ability to manage health-related needs will improve Outcome: Completed/Met   Problem: Clinical Measurements: Goal: Ability to maintain clinical measurements within normal limits will improve Outcome: Completed/Met Goal: Will remain free from infection Outcome: Completed/Met Goal: Diagnostic test results will improve Outcome: Completed/Met Goal: Respiratory complications will improve Outcome: Completed/Met Goal: Cardiovascular complication will be avoided Outcome: Completed/Met   Problem: Activity: Goal: Risk for activity intolerance will decrease Outcome: Completed/Met   Problem: Nutrition: Goal: Adequate nutrition will be maintained Outcome: Completed/Met   Problem: Coping: Goal: Level of anxiety will decrease Outcome: Completed/Met   Problem: Elimination: Goal: Will not experience complications related to bowel motility Outcome: Completed/Met Goal: Will not experience complications related to urinary retention Outcome: Completed/Met   Problem: Pain Managment: Goal: General experience of  comfort will improve Outcome: Completed/Met   Problem: Safety: Goal: Ability to remain free from injury will improve Outcome: Completed/Met   Problem: Skin Integrity: Goal: Risk for impaired skin integrity will decrease Outcome: Completed/Met

## 2021-11-06 NOTE — Discharge Summary (Signed)
Physician Discharge Summary  Jeffrey Mathis [id number] DOB: September 26, 1980 DOA: 11/03/2021  PCP: Hoyt Koch, MD  Admit date: 11/03/2021 Discharge date: 11/06/2021  Admitted From: Home Disposition: Home  Recommendations for Outpatient Follow-up:  Follow up with PCP in 1 week with repeat CBC/CMP Abstain from alcohol Follow up in ED if symptoms worsen or new appear   Home Health: No Equipment/Devices: None  Discharge Condition: Stable CODE STATUS: Full Diet recommendation: Soft diet  Brief/Interim Summary: 41 y.o. male with medical history significant of recurrent pancreatitis, history of alcohol abuse, fatty liver, sinus bradycardia, GERD presented with sudden onset of epigastric pain nauseous vomiting.  On presentation, lipase was 202, WBC of 18.  CT of abdomen and pelvis with contrast showed acute uncomplicated pancreatitis with no evidence of pancreatic necrosis, no mass or duct dilatation.  Due to the hospitalization, his diet was gradually advanced.  He is currently tolerating soft diet with improved abdominal pain and no vomiting.  He is okay to go home today.  He will be discharged home today with close outpatient follow-up with PCP.  Discharge Diagnoses:   Acute recurrent pancreatitis -Most likely alcohol induced -CT findings as above.  Lipase 202 on presentation. -MRI/MRCP showed acute uncomplicated pancreatitis with no choledocholithiasis or pancreatic duct obstruction -Treated with IV fluids, pain management, antiemetics as needed.  Patient was initially n.p.o.  Diet was gradually advanced.   -Triglyceride 185 -He is currently tolerating soft diet with improved abdominal pain and no vomiting.  He is okay to go home today.  He will be discharged home today with close outpatient follow-up with PCP. -Needs to abstain from alcohol   Leukocytosis -Possibly reactive.  Resolved  GERD -Continue PPI   Sinus bradycardia -Stable  Discharge Instructions  Discharge  Instructions     Diet general   Complete by: As directed    Soft diet   Increase activity slowly   Complete by: As directed       Allergies as of 11/06/2021   No Known Allergies      Medication List     STOP taking these medications    esomeprazole 20 MG capsule Commonly known as: NEXIUM   oxyCODONE-acetaminophen 5-325 MG tablet Commonly known as: PERCOCET/ROXICET       TAKE these medications    acetaminophen 325 MG tablet Commonly known as: TYLENOL Take 650 mg by mouth every 6 (six) hours as needed for mild pain, fever or headache.   MELATONIN CR PO Take 1 tablet by mouth daily.   oxyCODONE 5 MG immediate release tablet Commonly known as: Oxy IR/ROXICODONE Take 1-2 tablets (5-10 mg total) by mouth every 6 (six) hours as needed.   pantoprazole 40 MG tablet Commonly known as: Protonix Take 1 tablet (40 mg total) by mouth daily.        Follow-up Information     Hoyt Koch, MD. Schedule an appointment as soon as possible for a visit in 1 week(s).   Specialty: Internal Medicine Contact information: [place] [address] [phone]                No Known Allergies  Consultations: None   Procedures/Studies: DG Chest 2 View  Result Date: 11/03/2021 CLINICAL DATA:  Shortness of breath. EXAM: CHEST - 2 VIEW COMPARISON:  None Available. FINDINGS: No consolidation. No visible pleural effusions or pneumothorax. Cardiomediastinal silhouette is within normal limits. No displaced fracture. IMPRESSION: No evidence of acute cardiopulmonary disease. Electronically Signed   By: Roslynn Amble  Ronnald Ramp M.D.   On: 11/03/2021 10:34   CT Abdomen Pelvis W Contrast  Result Date: 11/03/2021 CLINICAL DATA:  Acute abdominal pain.  History of pancreatitis. EXAM: CT ABDOMEN AND PELVIS WITH CONTRAST TECHNIQUE: Multidetector CT imaging of the abdomen and pelvis was performed using the standard protocol following bolus administration of intravenous  contrast. RADIATION DOSE REDUCTION: This exam was performed according to the departmental dose-optimization program which includes automated exposure control, adjustment of the mA and/or kV according to patient size and/or use of iterative reconstruction technique. CONTRAST:  159mL OMNIPAQUE IOHEXOL 300 MG/ML  SOLN COMPARISON:  05/17/2021. FINDINGS: Lower chest: Clear lung bases. Hepatobiliary: Decreased attenuation of the liver consistent with fatty infiltration. Liver normal size. No mass or focal lesion. Normal gallbladder. No bile duct dilation. Pancreas: Mild peripancreatic inflammatory stranding and haziness. Small amount of fluid lies adjacent to the pancreatic head and duodenum abutting the anterior right pararenal space. Pancreas shows homogeneous enhancement. No mass or duct dilation. No peripancreatic fluid collection. Spleen: Normal in size without focal abnormality. Adrenals/Urinary Tract: Adrenal glands are unremarkable. Kidneys are normal, without renal calculi, focal lesion, or hydronephrosis. Bladder is unremarkable. Stomach/Bowel: Normal stomach. Mild reactive wall thickening along the second and third portions of the duodenum. Small bowel and colon are normal in caliber. No wall thickening or inflammatory changes. Vascular/Lymphatic: Portal vein, splenic vein and superior mesenteric veins are patent. No enlarged lymph nodes. Reproductive: Unremarkable. Other: None. Musculoskeletal: No acute or significant osseous findings. IMPRESSION: 1. Acute uncomplicated pancreatitis. No evidence of pancreatic necrosis. No peripancreatic fluid collection. 2. Hepatic steatosis. Electronically Signed   By: Lajean Manes M.D.   On: 11/03/2021 14:21   MR 3D Recon At Scanner  Result Date: 11/04/2021 CLINICAL DATA:  41 year old male with history of severe acute pancreatitis. EXAM: MRI ABDOMEN WITHOUT AND WITH CONTRAST (INCLUDING MRCP) TECHNIQUE: Multiplanar multisequence MR imaging of the abdomen was performed  both before and after the administration of intravenous contrast. Heavily T2-weighted images of the biliary and pancreatic ducts were obtained, and three-dimensional MRCP images were rendered by post processing. CONTRAST:  79mL GADAVIST GADOBUTROL 1 MMOL/ML IV SOLN COMPARISON:  No prior abdominal MRI. CT the abdomen and pelvis 11/03/2021. FINDINGS: Lower chest: Unremarkable. Hepatobiliary: Mild diffuse loss of signal intensity throughout the hepatic parenchyma on out of phase dual echo images, indicative of a background of mild hepatic steatosis. No suspicious cystic or solid hepatic lesions. No intra or extrahepatic biliary ductal dilatation noted on MRCP images. Common bile duct measures 4 mm in the porta hepatis. No filling defect in the common bile duct on MRCP images to suggest the presence of choledocholithiasis. Gallbladder is unremarkable in appearance. Pancreas: No pancreatic mass. No pancreatic ductal dilatation noted on MRCP images. Increased T2 signal intensity surrounding the pancreas, indicative of acute inflammation. Pancreatic parenchyma appears to enhance normally. No well organized peripancreatic fluid collection identified to suggest pancreatic pseudocyst at this time. Spleen:  Unremarkable. Adrenals/Urinary Tract: Bilateral kidneys and bilateral adrenal glands are normal in appearance. No hydroureteronephrosis in the visualized portions of the abdomen. Bilateral adrenal glands are normal in appearance. Stomach/Bowel: Visualized portions are unremarkable. Vascular/Lymphatic: No aneurysm identified in the visualized abdominal vasculature. Splenic and portal veins are patent. No lymphadenopathy noted in the abdomen. Other: Trace volume of ascites. T2 hyperintense fluid in the retroperitoneum, most evident adjacent to the pancreas and tracking into the root of the small bowel mesentery. Musculoskeletal: No aggressive appearing lytic or blastic lesions are noted in the visualized portions of the  skeleton. IMPRESSION: 1. Imaging findings are compatible with acute uncomplicated pancreatitis, as detailed above. 2. Hepatic steatosis. 3. No choledocholithiasis or findings of biliary tract obstruction or pancreatic duct obstruction. Electronically Signed   By: Vinnie Langton M.D.   On: 11/04/2021 11:59   MR ABDOMEN MRCP W WO CONTAST  Result Date: 11/04/2021 CLINICAL DATA:  41 year old male with history of severe acute pancreatitis. EXAM: MRI ABDOMEN WITHOUT AND WITH CONTRAST (INCLUDING MRCP) TECHNIQUE: Multiplanar multisequence MR imaging of the abdomen was performed both before and after the administration of intravenous contrast. Heavily T2-weighted images of the biliary and pancreatic ducts were obtained, and three-dimensional MRCP images were rendered by post processing. CONTRAST:  40mL GADAVIST GADOBUTROL 1 MMOL/ML IV SOLN COMPARISON:  No prior abdominal MRI. CT the abdomen and pelvis 11/03/2021. FINDINGS: Lower chest: Unremarkable. Hepatobiliary: Mild diffuse loss of signal intensity throughout the hepatic parenchyma on out of phase dual echo images, indicative of a background of mild hepatic steatosis. No suspicious cystic or solid hepatic lesions. No intra or extrahepatic biliary ductal dilatation noted on MRCP images. Common bile duct measures 4 mm in the porta hepatis. No filling defect in the common bile duct on MRCP images to suggest the presence of choledocholithiasis. Gallbladder is unremarkable in appearance. Pancreas: No pancreatic mass. No pancreatic ductal dilatation noted on MRCP images. Increased T2 signal intensity surrounding the pancreas, indicative of acute inflammation. Pancreatic parenchyma appears to enhance normally. No well organized peripancreatic fluid collection identified to suggest pancreatic pseudocyst at this time. Spleen:  Unremarkable. Adrenals/Urinary Tract: Bilateral kidneys and bilateral adrenal glands are normal in appearance. No hydroureteronephrosis in the  visualized portions of the abdomen. Bilateral adrenal glands are normal in appearance. Stomach/Bowel: Visualized portions are unremarkable. Vascular/Lymphatic: No aneurysm identified in the visualized abdominal vasculature. Splenic and portal veins are patent. No lymphadenopathy noted in the abdomen. Other: Trace volume of ascites. T2 hyperintense fluid in the retroperitoneum, most evident adjacent to the pancreas and tracking into the root of the small bowel mesentery. Musculoskeletal: No aggressive appearing lytic or blastic lesions are noted in the visualized portions of the skeleton. IMPRESSION: 1. Imaging findings are compatible with acute uncomplicated pancreatitis, as detailed above. 2. Hepatic steatosis. 3. No choledocholithiasis or findings of biliary tract obstruction or pancreatic duct obstruction. Electronically Signed   By: Vinnie Langton M.D.   On: 11/04/2021 11:59      Subjective: Patient seen and examined at bedside.  Feels much better and wants to go home today.  Denies any overnight fever, vomiting or worsening abdominal pain.  Tolerated dinner last night.  Discharge Exam: Vitals:   11/05/21 1944 11/06/21 0558  BP: (!) 138/96 (!) 137/99  Pulse: 94 (!) 54  Resp: 18 15  Temp: 99.2 F (37.3 C) 98.6 F (37 C)  SpO2: 98% 100%    General: Pt is alert, awake, not in acute distress Cardiovascular: Mildly bradycardic, S1/S2 + Respiratory: bilateral decreased breath sounds at bases Abdominal: Soft, NT, ND, bowel sounds + Extremities: no edema, no cyanosis    The results of significant diagnostics from this hospitalization (including imaging, microbiology, ancillary and laboratory) are listed below for reference.     Microbiology: No results found for this or any previous visit (from the past 240 hour(s)).   Labs: BNP (last 3 results) No results for input(s): BNP in the last 8760 hours. Basic Metabolic Panel: Recent Labs  Lab 11/03/21 1157 11/05/21 0521 11/06/21 0441   NA 141 137 139  K 4.0 3.6 3.9  CL  108 106 109  CO2 25 26 27   GLUCOSE 99 92 130*  BUN 14 6 <5*  CREATININE 0.83 0.61 0.77  CALCIUM 8.7* 8.6* 8.2*  MG  --  1.8 1.8   Liver Function Tests: Recent Labs  Lab 11/03/21 1157 11/05/21 0521 11/06/21 0441  AST 19 15 13*  ALT 17 11 10   ALKPHOS 66 54 49  BILITOT 1.3* 1.2 0.6  PROT 7.3 6.1* 5.6*  ALBUMIN 4.4 3.4* 3.0*   Recent Labs  Lab 11/03/21 1157 11/04/21 0536  LIPASE 202* 394*   No results for input(s): AMMONIA in the last 168 hours. CBC: Recent Labs  Lab 11/03/21 1157 11/04/21 0536 11/05/21 0521  WBC 18.0* 13.3* 9.8  NEUTROABS 15.0*  --  7.1  HGB 15.5 13.7 13.1  HCT 44.4 39.6 37.7*  MCV 94.5 95.7 93.8  PLT 271 192 178   Cardiac Enzymes: No results for input(s): CKTOTAL, CKMB, CKMBINDEX, TROPONINI in the last 168 hours. BNP: Invalid input(s): POCBNP CBG: No results for input(s): GLUCAP in the last 168 hours. D-Dimer No results for input(s): DDIMER in the last 72 hours. Hgb A1c No results for input(s): HGBA1C in the last 72 hours. Lipid Profile Recent Labs    11/03/21 1157  CHOL 125  HDL 40*  LDLCALC 48  TRIG 185*  CHOLHDL 3.1   Thyroid function studies No results for input(s): TSH, T4TOTAL, T3FREE, THYROIDAB in the last 72 hours.  Invalid input(s): FREET3 Anemia work up No results for input(s): VITAMINB12, FOLATE, FERRITIN, TIBC, IRON, RETICCTPCT in the last 72 hours. Urinalysis    Component Value Date/Time   COLORURINE YELLOW 11/03/2021 1705   APPEARANCEUR CLEAR 11/03/2021 1705   LABSPEC >1.046 (H) 11/03/2021 1705   PHURINE 6.0 11/03/2021 1705   GLUCOSEU NEGATIVE 11/03/2021 1705   HGBUR SMALL (A) 11/03/2021 1705   BILIRUBINUR NEGATIVE 11/03/2021 1705   KETONESUR 20 (A) 11/03/2021 1705   PROTEINUR NEGATIVE 11/03/2021 1705   NITRITE NEGATIVE 11/03/2021 1705   LEUKOCYTESUR NEGATIVE 11/03/2021 1705   Sepsis Labs Invalid input(s): PROCALCITONIN,  WBC,  LACTICIDVEN Microbiology No results  found for this or any previous visit (from the past 240 hour(s)).   Time coordinating discharge: 35 minutes  SIGNED:   Aline August, MD  [facility] 11/06/2021, 10:23 AM

## 2021-11-06 NOTE — Progress Notes (Signed)
  Transition of Care [facility]) Screening Note   Patient Details  Name: Jeffrey Mathis Date of Birth: 1980/09/28   Transition of Care [facility]) CM/SW Contact:    Dessa Phi, RN Phone Number: 11/06/2021, 10:45 AM    Transition of Care Department [facility]) has reviewed patient and no TOC needs have been identified at this time. We will continue to monitor patient advancement through interdisciplinary progression rounds. If new patient transition needs arise, please place a TOC consult.

## 2021-11-07 ENCOUNTER — Telehealth: Payer: Self-pay

## 2021-11-07 NOTE — Telephone Encounter (Addendum)
Transition Care Management Unsuccessful Follow-up Telephone Call  Date of discharge and from where:  TCM DC Jeffrey Mathis 11-06-21 Dx: acute recurrent pancreatitis  Attempts:  1st Attempt  Reason for unsuccessful TCM follow-up call:  Left voice message  Transition Care Management Unsuccessful Follow-up Telephone Call  Date of discharge and from where: TCM DC Jeffrey Mathis 11-06-21 Dx: acute recurrent pancreatitis   Attempts:  2nd Attempt  Reason for unsuccessful TCM follow-up call:  Left voice message  Transition Care Management Unsuccessful Follow-up Telephone Call  Date of discharge and from where:  TCM DC Jeffrey Mathis 11-06-21 Dx: acute recurrent pancreatitis  Attempts:  3rd Attempt  Reason for unsuccessful TCM follow-up call:  Left voice message

## 2022-06-07 ENCOUNTER — Inpatient Hospital Stay (HOSPITAL_COMMUNITY)
Admission: EM | Admit: 2022-06-07 | Discharge: 2022-06-09 | DRG: 440 | Disposition: A | Discharge status: Home / Self Care | Payer: No Typology Code available for payment source | Attending: Internal Medicine | Admitting: Internal Medicine

## 2022-06-07 ENCOUNTER — Other Ambulatory Visit: Payer: Self-pay

## 2022-06-07 ENCOUNTER — Encounter (HOSPITAL_COMMUNITY): Payer: Self-pay

## 2022-06-07 ENCOUNTER — Emergency Department (HOSPITAL_COMMUNITY): Payer: No Typology Code available for payment source

## 2022-06-07 DIAGNOSIS — R748 Abnormal levels of other serum enzymes: Secondary | ICD-10-CM | POA: Diagnosis present

## 2022-06-07 DIAGNOSIS — R7401 Elevation of levels of liver transaminase levels: Secondary | ICD-10-CM | POA: Diagnosis present

## 2022-06-07 DIAGNOSIS — Z818 Family history of other mental and behavioral disorders: Secondary | ICD-10-CM | POA: Diagnosis not present

## 2022-06-07 DIAGNOSIS — F4310 Post-traumatic stress disorder, unspecified: Secondary | ICD-10-CM | POA: Diagnosis present

## 2022-06-07 DIAGNOSIS — Z79899 Other long term (current) drug therapy: Secondary | ICD-10-CM

## 2022-06-07 DIAGNOSIS — F419 Anxiety disorder, unspecified: Secondary | ICD-10-CM | POA: Diagnosis present

## 2022-06-07 DIAGNOSIS — F1020 Alcohol dependence, uncomplicated: Secondary | ICD-10-CM

## 2022-06-07 DIAGNOSIS — K760 Fatty (change of) liver, not elsewhere classified: Secondary | ICD-10-CM | POA: Diagnosis present

## 2022-06-07 DIAGNOSIS — R001 Bradycardia, unspecified: Secondary | ICD-10-CM | POA: Diagnosis present

## 2022-06-07 DIAGNOSIS — Z79890 Hormone replacement therapy (postmenopausal): Secondary | ICD-10-CM

## 2022-06-07 DIAGNOSIS — Z809 Family history of malignant neoplasm, unspecified: Secondary | ICD-10-CM

## 2022-06-07 DIAGNOSIS — F17210 Nicotine dependence, cigarettes, uncomplicated: Secondary | ICD-10-CM | POA: Diagnosis present

## 2022-06-07 DIAGNOSIS — K852 Alcohol induced acute pancreatitis without necrosis or infection: Secondary | ICD-10-CM | POA: Diagnosis not present

## 2022-06-07 DIAGNOSIS — F32A Depression, unspecified: Secondary | ICD-10-CM | POA: Diagnosis present

## 2022-06-07 DIAGNOSIS — Z23 Encounter for immunization: Secondary | ICD-10-CM | POA: Diagnosis not present

## 2022-06-07 DIAGNOSIS — Z720 Tobacco use: Secondary | ICD-10-CM | POA: Diagnosis present

## 2022-06-07 LAB — CBC
HCT: 48.9 % (ref 39.0–52.0)
Hemoglobin: 16.9 g/dL (ref 13.0–17.0)
MCH: 33.5 pg (ref 26.0–34.0)
MCHC: 34.6 g/dL (ref 30.0–36.0)
MCV: 97 fL (ref 80.0–100.0)
Platelets: 220 10*3/uL (ref 150–400)
RBC: 5.04 MIL/uL (ref 4.22–5.81)
RDW: 12.4 % (ref 11.5–15.5)
WBC: 12.9 10*3/uL — ABNORMAL HIGH (ref 4.0–10.5)
nRBC: 0 % (ref 0.0–0.2)

## 2022-06-07 LAB — COMPREHENSIVE METABOLIC PANEL
ALT: 131 U/L — ABNORMAL HIGH (ref 0–44)
AST: 104 U/L — ABNORMAL HIGH (ref 15–41)
Albumin: 4.6 g/dL (ref 3.5–5.0)
Alkaline Phosphatase: 70 U/L (ref 38–126)
Anion gap: 15 (ref 5–15)
BUN: 7 mg/dL (ref 6–20)
CO2: 23 mmol/L (ref 22–32)
Calcium: 9.3 mg/dL (ref 8.9–10.3)
Chloride: 98 mmol/L (ref 98–111)
Creatinine, Ser: 0.85 mg/dL (ref 0.61–1.24)
GFR, Estimated: >60 mL/min (ref >60)
Glucose, Bld: 91 mg/dL (ref 70–99)
Potassium: 4.2 mmol/L (ref 3.5–5.1)
Sodium: 136 mmol/L (ref 135–145)
Total Bilirubin: 1.8 mg/dL — ABNORMAL HIGH (ref 0.3–1.2)
Total Protein: 7.8 g/dL (ref 6.5–8.1)

## 2022-06-07 LAB — URINALYSIS, ROUTINE W REFLEX MICROSCOPIC
Bacteria, UA: NONE SEEN
Glucose, UA: NEGATIVE mg/dL
Hgb urine dipstick: NEGATIVE
Ketones, ur: 80 mg/dL — AB
Leukocytes,Ua: NEGATIVE
Nitrite: NEGATIVE
Protein, ur: 100 mg/dL — AB
Specific Gravity, Urine: 1.028 (ref 1.005–1.030)
pH: 5 (ref 5.0–8.0)

## 2022-06-07 LAB — LIPASE, BLOOD: Lipase: 289 U/L — ABNORMAL HIGH (ref 11–51)

## 2022-06-07 MED ORDER — ENOXAPARIN SODIUM 40 MG/0.4ML IJ SOSY
40.0000 mg | PREFILLED_SYRINGE | INTRAMUSCULAR | Status: DC
  Administered 2022-06-08 – 2022-06-09 (×2): 40 mg via SUBCUTANEOUS
  Filled 2022-06-07 (×2): qty 0.4

## 2022-06-07 MED ORDER — LORAZEPAM 1 MG PO TABS
1.0000 mg | ORAL_TABLET | ORAL | Status: DC | PRN
  Administered 2022-06-08: 1 mg via ORAL
  Administered 2022-06-08: 2 mg via ORAL
  Filled 2022-06-07: qty 1
  Filled 2022-06-07: qty 2

## 2022-06-07 MED ORDER — ONDANSETRON HCL 4 MG/2ML IJ SOLN
4.0000 mg | Freq: Four times a day (QID) | INTRAMUSCULAR | Status: DC | PRN
  Administered 2022-06-08: 4 mg via INTRAVENOUS
  Filled 2022-06-07: qty 2

## 2022-06-07 MED ORDER — ADULT MULTIVITAMIN W/MINERALS CH
1.0000 | ORAL_TABLET | Freq: Every day | ORAL | Status: DC
  Administered 2022-06-07 – 2022-06-09 (×3): 1 via ORAL
  Filled 2022-06-07 (×3): qty 1

## 2022-06-07 MED ORDER — THIAMINE MONONITRATE 100 MG PO TABS
100.0000 mg | ORAL_TABLET | Freq: Every day | ORAL | Status: DC
  Administered 2022-06-08 – 2022-06-09 (×2): 100 mg via ORAL
  Filled 2022-06-07 (×3): qty 1

## 2022-06-07 MED ORDER — ONDANSETRON HCL 4 MG/2ML IJ SOLN
4.0000 mg | Freq: Once | INTRAMUSCULAR | Status: AC
  Administered 2022-06-07: 4 mg via INTRAVENOUS
  Filled 2022-06-07: qty 2

## 2022-06-07 MED ORDER — IOHEXOL 300 MG/ML  SOLN
100.0000 mL | Freq: Once | INTRAMUSCULAR | Status: AC | PRN
  Administered 2022-06-07: 100 mL via INTRAVENOUS

## 2022-06-07 MED ORDER — LACTATED RINGERS IV SOLN: INTRAVENOUS | Status: DC

## 2022-06-07 MED ORDER — MORPHINE SULFATE (PF) 2 MG/ML IV SOLN
1.0000 mg | INTRAVENOUS | Status: DC | PRN
  Administered 2022-06-07 – 2022-06-09 (×18): 1 mg via INTRAVENOUS
  Filled 2022-06-07 (×18): qty 1

## 2022-06-07 MED ORDER — THIAMINE HCL 100 MG/ML IJ SOLN
100.0000 mg | Freq: Every day | INTRAMUSCULAR | Status: DC
  Administered 2022-06-07: 100 mg via INTRAVENOUS
  Filled 2022-06-07: qty 2

## 2022-06-07 MED ORDER — FOLIC ACID 1 MG PO TABS
1.0000 mg | ORAL_TABLET | Freq: Every day | ORAL | Status: DC
  Administered 2022-06-07 – 2022-06-09 (×3): 1 mg via ORAL
  Filled 2022-06-07 (×3): qty 1

## 2022-06-07 MED ORDER — MELATONIN 3 MG PO TABS: 3.0000 mg | ORAL_TABLET | Freq: Every evening | ORAL | Status: DC | PRN

## 2022-06-07 MED ORDER — LACTATED RINGERS IV BOLUS
1000.0000 mL | Freq: Once | INTRAVENOUS | Status: AC
  Administered 2022-06-07: 1000 mL via INTRAVENOUS

## 2022-06-07 MED ORDER — SODIUM CHLORIDE 0.9% FLUSH
3.0000 mL | Freq: Two times a day (BID) | INTRAVENOUS | Status: DC
  Administered 2022-06-07 – 2022-06-09 (×3): 3 mL via INTRAVENOUS

## 2022-06-07 MED ORDER — MORPHINE SULFATE (PF) 4 MG/ML IV SOLN
4.0000 mg | Freq: Once | INTRAVENOUS | Status: AC
  Administered 2022-06-07: 4 mg via INTRAVENOUS
  Filled 2022-06-07: qty 1

## 2022-06-07 NOTE — Assessment & Plan Note (Signed)
Reports smoking 1-2 cigarettes daily.  Declines nicotine patch.

## 2022-06-07 NOTE — Assessment & Plan Note (Signed)
Unfortunately relapsed 2 weeks ago.  Last drink about 2 days prior to admission.  Does not appear to be actively withdrawing. -Continue CIWA protocol -TOC consult

## 2022-06-07 NOTE — ED Provider Triage Note (Signed)
Emergency Medicine Provider Triage Evaluation Note  Jeffrey Mathis , a 42 y.o. male  was evaluated in triage.  Pt complains of upper abdominal pain started 2 days ago.  Patient reports the pain was intermittent when it started and then the last couple days it has been constant and radiating to his back.  Patient reports nausea vomiting and diarrhea.  Patient reports dark urine but no other urinary symptoms.  History of alcoholic use disorder.  Last drink was 2 days ago.  Reports 2 bottles of once a day.  No fever, chest pain, shortness of breath.  History of tachycardia Titus.  Review of Systems  Positive: As above Negative: As above  Physical Exam  BP (!) 145/100 (BP Location: Left Arm)   Pulse (!) 108   Temp 98.2 F (36.8 C) (Oral)   Resp 18   Ht 6' (1.829 m)   Wt 72.6 kg   SpO2 98%   BMI 21.70 kg/m  Gen:   Awake, no distress   Resp:  Normal effort  MSK:   Moves extremities without difficulty  Other:  TTP to upper abdomen  Medical Decision Making  Medically screening exam initiated at 9:34 AM.  Appropriate orders placed.  Jeffrey Mathis was informed that the remainder of the evaluation will be completed by another provider, this initial triage assessment does not replace that evaluation, and the importance of remaining in the ED until their evaluation is complete.     Rex Kras, [place] 06/07/22 1818

## 2022-06-07 NOTE — ED Provider Notes (Signed)
[facility] DEPT Provider Note   CSN: [id number] Arrival date & time: 06/07/22  0825     History  Chief Complaint  Patient presents with   Abdominal Pain   Back Pain   Emesis    Jeffrey Mathis is a 42 y.o. male.  Patient is a 42 year old male with a past medical history of PTSD, alcohol use disorder and pancreatitis presenting to the emergency department with abdominal pain.  Patient states that he has had epigastric abdominal pain that radiates across the top of his abdomen with nausea and vomiting over the last 3 days.  He states initially the pain was intermittent and now it has become constant and more severe.  He denies any fevers or chills.  He reports that he did have some specks of blood in his vomit today.  He dates he has had some constipation but denies any black or bloody stools.  He states that this feels similar to his prior pancreatitis episodes.  He states that he did recently relapse on alcohol which she thinks caused the symptoms.  He states he has had alcohol withdrawal in the past when he was drinking heavily but has not had any withdrawal symptoms recently with the amount he is drinking.  The history is provided by the patient and the spouse.  Abdominal Pain Associated symptoms: vomiting   Back Pain Associated symptoms: abdominal pain   Emesis Associated symptoms: abdominal pain        Home Medications Prior to Admission medications   Medication Sig Start Date End Date Taking? Authorizing Provider  acetaminophen (TYLENOL) 325 MG tablet Take 650 mg by mouth every 6 (six) hours as needed for mild pain, fever or headache.    [provider]  MELATONIN CR PO Take 1 tablet by mouth daily.    [provider]  oxyCODONE (OXY IR/ROXICODONE) 5 MG immediate release tablet Take 1-2 tablets (5-10 mg total) by mouth every 6 (six) hours as needed. 11/06/21   Aline August, MD  pantoprazole (PROTONIX) 40 MG tablet Take 1  tablet (40 mg total) by mouth daily. 11/06/21 12/06/21  Aline August, MD      Allergies    Patient has no known allergies.    Review of Systems   Review of Systems  Gastrointestinal:  Positive for abdominal pain and vomiting.  Musculoskeletal:  Positive for back pain.    Physical Exam Updated Vital Signs BP (!) 159/90 (BP Location: Right Arm)   Pulse (!) 52   Temp 98.1 F (36.7 C) (Oral)   Resp 20   Ht 6' (1.829 m)   Wt 72.6 kg   SpO2 99%   BMI 21.70 kg/m  Physical Exam Vitals and nursing note reviewed.  Constitutional:      General: He is not in acute distress.    Appearance: He is well-developed.  HENT:     Head: Normocephalic and atraumatic.     Mouth/Throat:     Mouth: Mucous membranes are moist.     Pharynx: Oropharynx is clear.  Eyes:     Pupils: Pupils are equal, round, and reactive to light.  Cardiovascular:     Rate and Rhythm: Normal rate and regular rhythm.     Heart sounds: Normal heart sounds.  Pulmonary:     Effort: Pulmonary effort is normal.     Breath sounds: Normal breath sounds.  Abdominal:     General: Abdomen is flat.     Palpations: Abdomen is soft.  Tenderness: There is abdominal tenderness in the right upper quadrant, epigastric area and left upper quadrant. There is guarding. There is no right CVA tenderness, left CVA tenderness or rebound.  Neurological:     General: No focal deficit present.     Mental Status: He is alert and oriented to person, place, and time.  Psychiatric:        Mood and Affect: Mood normal.        Behavior: Behavior normal.     ED Results / Procedures / Treatments   Labs (all labs ordered are listed, but only abnormal results are displayed) Labs Reviewed  LIPASE, BLOOD - Abnormal; Notable for the following components:      Result Value   Lipase 289 (*)    All other components within normal limits  COMPREHENSIVE METABOLIC PANEL - Abnormal; Notable for the following components:   AST 104 (*)    ALT 131  (*)    Total Bilirubin 1.8 (*)    All other components within normal limits  CBC - Abnormal; Notable for the following components:   WBC 12.9 (*)    All other components within normal limits  URINALYSIS, ROUTINE W REFLEX MICROSCOPIC - Abnormal; Notable for the following components:   Color, Urine AMBER (*)    APPearance HAZY (*)    Bilirubin Urine SMALL (*)    Ketones, ur 80 (*)    Protein, ur 100 (*)    All other components within normal limits    EKG None  Radiology CT ABDOMEN PELVIS W CONTRAST  Result Date: 06/07/2022 CLINICAL DATA:  Epigastric abdominal pain for 3 days. History of pancreatitis EXAM: CT ABDOMEN AND PELVIS WITH CONTRAST TECHNIQUE: Multidetector CT imaging of the abdomen and pelvis was performed using the standard protocol following bolus administration of intravenous contrast. RADIATION DOSE REDUCTION: This exam was performed according to the departmental dose-optimization program which includes automated exposure control, adjustment of the mA and/or kV according to patient size and/or use of iterative reconstruction technique. CONTRAST:  OMNIPAQUE IOHEXOL 300 MG/ML  SOLN COMPARISON:  CT 11/03/2021 and older. MRI 11/04/2021. Ultrasound limited 02/03/2018. FINDINGS: Lower chest: Breathing motion at the lung bases. No pleural effusion. Hepatobiliary: Diffuse fatty liver infiltration. No enhancing liver lesion. Gallbladder is nondilated. Patent portal vein. Pancreas: Mild-to-moderate global pancreatic atrophy without mass lesion. Preserved enhancement. There is peripancreatic fat stranding particularly towards the head and neck region. Stranding also extends to the origin of the mesentery and the second portion of the duodenum. Spleen: Spleen is nonenlarged.  Preserved enhancement. Adrenals/Urinary Tract: Adrenal glands are symmetric. No abnormal renal enhancement. No mass lesion or collecting system dilatation. Bladder is underdistended but otherwise preserved contour.  Stomach/Bowel: On this non oral contrast exam the large bowel has a normal course and caliber. There is long segmental wall thickening along the right side of the colon, ascending and proximal transverse. Areas lower left side of the colon as well. Please correlate for a diffuse colitis. There is associated mesenteric vascular engorgement. The stomach and small bowel are nondilated. No free air or fluid collection. Vascular/Lymphatic: Normal caliber aorta and IVC. Mild vascular calcifications are identified. No developing abnormal lymph node enlargement seen in the abdomen and pelvis. Reproductive: Prostate is unremarkable. Other: No abdominal wall hernia or abnormality. No abdominopelvic ascites. Musculoskeletal: No acute or significant osseous findings. IMPRESSION: Multifocal areas of mild colonic wall thickening with vascular engorgement. Please correlate for colitis. There is some mild stranding around the pancreatic head and neck region  and the second portion of the duodenum. Please correlate with any clinical evidence of pancreatitis. No well-defined fluid collections or areas of poor enhancement. Fatty liver infiltration Electronically Signed   By: Jill Side M.D.   On: 06/07/2022 10:06    Procedures Procedures    Medications Ordered in ED Medications  lactated ringers bolus 1,000 mL (has no administration in time range)  ondansetron (ZOFRAN) injection 4 mg (has no administration in time range)  morphine (PF) 4 MG/ML injection 4 mg (has no administration in time range)  LORazepam (ATIVAN) tablet 1-4 mg (has no administration in time range)  thiamine (VITAMIN B1) tablet 100 mg (has no administration in time range)    Or  thiamine (VITAMIN B1) injection 100 mg (has no administration in time range)  folic acid (FOLVITE) tablet 1 mg (has no administration in time range)  multivitamin with minerals tablet 1 tablet (has no administration in time range)  iohexol (OMNIPAQUE) 300 MG/ML solution 100  mL (100 mLs Intravenous Contrast Given 06/07/22 0949)    ED Course/ Medical Decision Making/ A&P                           Medical Decision Making This patient presents to the ED with chief complaint(s) of abdominal pain with pertinent past medical history of PTSD, ETOH use, pancreatitis which further complicates the presenting complaint. The complaint involves an extensive differential diagnosis and also carries with it a high risk of complications and morbidity.    The differential diagnosis includes alcohol withdrawal, AKA, pancreatitis, gastritis, GERD, hepatitis, cholelithiasis, cholecystitis, dehydration, electrolyte abnormality  Additional history obtained: Additional history obtained from spouse Records reviewed previous admission documents  ED Course and Reassessment: Patient was initially evaluated by provider in triage and had labs and CT performed.  Labs are significant for elevated lipase and transaminitis concerning for pancreatitis.  CT abdomen and pelvis showed inflammation around the pancreas consistent with pancreatitis without signs of any complications.  No evidence of gallstones.  The patient will be given fluids, Zofran and morphine for symptomatic control.  The patient states that he is always required admission for pain and nausea control for his pancreatitis and hospitalist will be consulted for admission.  Independent labs interpretation:  The following labs were independently interpreted: Elevated lipase, transaminitis  Independent visualization of imaging: - I independently visualized the following imaging with scope of interpretation limited to determining acute life threatening conditions related to emergency care: CT AP, which revealed pancreatitis  Consultation: - Consulted or discussed management/test interpretation w/ external professional: Hospitalists  Consideration for admission or further workup: Patient requires admission for further symptomatic control  in the setting of his pancreatitis Social Determinants of health: Alcohol use disorder    Amount and/or Complexity of Data Reviewed Labs: ordered.  Risk OTC drugs. Prescription drug management. Decision regarding hospitalization.          Final Clinical Impression(s) / ED Diagnoses Final diagnoses:  Alcohol-induced acute pancreatitis without infection or necrosis    Rx / DC Orders ED Discharge Orders     None         Kemper Durie, DO 06/07/22 2011

## 2022-06-07 NOTE — Assessment & Plan Note (Signed)
Occurring after relapse and alcohol use.  Lipase is 289.  CT A/P with mild stranding around pancreatic head and neck.  LFTs mildly elevated. -N.p.o. except for sips and meds as tolerated -IV morphine as needed with hold parameters -IV Zofran as needed -Continue IV fluids with LR@125  mL/hour overnight

## 2022-06-07 NOTE — Assessment & Plan Note (Signed)
Again noted on CT imaging.

## 2022-06-07 NOTE — ED Triage Notes (Addendum)
Patient c/o abdominal pain , low back pain, and N/v x 2 days.  Patient reports a history of pancreatitis and states he last drank alcohol 2 days ago.

## 2022-06-07 NOTE — Assessment & Plan Note (Signed)
Chronic asymptomatic sinus bradycardia.  Monitor on telemetry with ongoing IV narcotics for pain control.

## 2022-06-07 NOTE — ED Notes (Signed)
Patient refused zofran when offered. 

## 2022-06-07 NOTE — H&P (Signed)
History and Physical    Jeffrey Mathis KDT:[id number] DOB: February 05, 1981 DOA: 06/07/2022  PCP: [facility]  Patient coming from: Home  I have personally briefly reviewed patient's old medical records in [place]  Chief Complaint: Abdominal pain, nausea, vomiting  HPI: Jeffrey Mathis is a 42 y.o. male with medical history significant for alcohol use disorder with history of alcohol associated pancreatitis, sinus bradycardia, PTSD, depression/anxiety, tobacco use who presented to the ED for evaluation of abdominal pain associated with nausea and vomiting.  Patient states he is an alcoholic and was in remission until he relapsed about 2 weeks ago.  He has been drinking 2-3 bottles of wine daily.  About 2 days ago he developed epigastric pain with an episode of diarrhea and has been having poor oral intake due to nausea and vomiting.  Last night he developed severe epigastric pain.  He has not been able to keep down any oral intake due to persistent nausea and vomiting when trying to eat or drink.  He says diarrhea has resolved.  He has had several episodes of pancreatitis in the past and states this feels similar to those.  Last drink was 2 days ago.  ED Course  Labs/Imaging on admission: I have personally reviewed following labs and imaging studies.  Initial vitals showed BP 145/100, pulse 108, RR 18, temp 98.2 F, SpO2 98% on room air.  Labs show WBC 12.9, hemoglobin 16.9, platelets 220,000, sodium 136, potassium 4.2, bicarb 23, BUN 7, creatinine 0.5, serum glucose 91, AST 104, ALT 131, alk phos 70, total bilirubin 1.8, lipase 289.  CT abdomen/pelvis with contrast showed mild stranding around the pancreatic head and neck and second portion of duodenum.  No well-defined fluid collection or areas of poor enhancement.  Multifocal areas of mild colonic wall thickening with vascular engorgement noted.  Fatty infiltration of the liver also seen.  Patient was given 1 L LR, IV  morphine 4 mg, placed on CIWA protocol.  The hospitalist service was consulted to admit for further evaluation and management.  Review of Systems: All systems reviewed and are negative except as documented in history of present illness above.   Past Medical History:  Diagnosis Date   Pancreatitis    PTSD (post-traumatic stress disorder)     Past Surgical History:  Procedure Laterality Date   ABDOMINAL SURGERY     scrapnel   APPENDECTOMY     arm surgery     KNEE ARTHROSCOPY      Social History:  reports that he has been smoking cigarettes. He has been smoking an average of .15 packs per day. He has never used smokeless tobacco. He reports current alcohol use. He reports current drug use.  No Known Allergies  Family History  Problem Relation Age of Onset   Cancer Mother    Bipolar disorder Mother    Cancer Father    Pancreatitis Father      Prior to Admission medications   Medication Sig Start Date End Date Taking? Authorizing Provider  acetaminophen (TYLENOL) 325 MG tablet Take 650 mg by mouth every 6 (six) hours as needed for mild pain, fever or headache.    [provider]  MELATONIN CR PO Take 1 tablet by mouth daily.    [provider]  oxyCODONE (OXY IR/ROXICODONE) 5 MG immediate release tablet Take 1-2 tablets (5-10 mg total) by mouth every 6 (six) hours as needed. 11/06/21   Aline August, MD  pantoprazole (PROTONIX) 40 MG tablet Take 1  tablet (40 mg total) by mouth daily. 11/06/21 12/06/21  Aline August, MD    Physical Exam: Vitals:   06/07/22 1938 06/07/22 2003 06/07/22 2100 06/07/22 2123  BP: (!) 147/97 (!) 159/90 (!) 145/86 (!) 145/86  Pulse: (!) 101 (!) 52 (!) 49 (!) 49  Resp: _0 Temp: 99.1 F (37.3 C) 98.1 F (36.7 C)    TempSrc: Oral Oral    SpO2: 96% 99% 94% 95%  Weight:      Height:       Constitutional: Resting in bed, NAD, calm Eyes: EOMI, lids and conjunctivae normal ENMT: Mucous membranes are dry. Posterior pharynx  clear of any exudate or lesions.Normal dentition.  Neck: normal, supple, no masses. Respiratory: clear to auscultation bilaterally, no wheezing, no crackles. Normal respiratory effort. No accessory muscle use.  Cardiovascular: Bradycardic, no murmurs / rubs / gallops. No extremity edema. 2+ pedal pulses. Abdomen: Epigastric tenderness, no masses palpated. Musculoskeletal: no clubbing / cyanosis. No joint deformity upper and lower extremities. Good ROM, no contractures. Normal muscle tone.  Skin: no rashes, lesions, ulcers. No induration Neurologic:  Sensation intact. Strength 5/5 in all 4.  Psychiatric: Alert and oriented x 3. Normal mood.   EKG: Ordered and pending.  Assessment/Plan Principal Problem:   Alcohol induced acute pancreatitis without necrosis or infection Active Problems:   Alcohol use disorder, severe, dependence (HCC)   Sinus bradycardia   Hepatic steatosis   Tobacco use   Jeffrey Mathis is a 42 y.o. male with medical history significant for alcohol use disorder with history of alcohol associated pancreatitis, sinus bradycardia, PTSD, depression/anxiety, tobacco use who is admitted with acute alcohol associated pancreatitis.  Assessment and Plan: * Alcohol induced acute pancreatitis without necrosis or infection Occurring after relapse and alcohol use.  Lipase is 289.  CT A/P with mild stranding around pancreatic head and neck.  LFTs mildly elevated. -N.p.o. except for sips and meds as tolerated -IV morphine as needed with hold parameters -IV Zofran as needed -Continue IV fluids with LR_1  mL/hour overnight  Alcohol use disorder, severe, dependence (HCC) Unfortunately relapsed 2 weeks ago.  Last drink about 2 days prior to admission.  Does not appear to be actively withdrawing. -Continue CIWA protocol -TOC consult  Sinus bradycardia Chronic asymptomatic sinus bradycardia.  Monitor on telemetry with ongoing IV narcotics for pain control.  Tobacco use Reports  smoking 1-2 cigarettes daily.  Declines nicotine patch.  Hepatic steatosis Again noted on CT imaging.  DVT prophylaxis: enoxaparin (LOVENOX) injection 40 mg Start: 06/07/22 2215 Code Status: Full code Family Communication: Discussed with patient, he has discussed with family Disposition Plan: From home, dispo pending clinical progress Consults called: None Severity of Illness: The appropriate patient status for this patient is OBSERVATION. Observation status is judged to be reasonable and necessary in order to provide the required intensity of service to ensure the patient's safety. The patient's presenting symptoms, physical exam findings, and initial radiographic and laboratory data in the context of their medical condition is felt to place them at decreased risk for further clinical deterioration. Furthermore, it is anticipated that the patient will be medically stable for discharge from the hospital within 2 midnights of admission.   Zada Finders MD [facility]  If 7PM-7AM, please contact night-coverage [email or web address]  06/07/2022, 10:10 PM

## 2022-06-07 NOTE — Hospital Course (Signed)
Jeffrey Mathis is a 42 y.o. male with medical history significant for alcohol use disorder with history of alcohol associated pancreatitis, sinus bradycardia, PTSD, depression/anxiety, tobacco use who is admitted with acute alcohol associated pancreatitis.

## 2022-06-08 ENCOUNTER — Encounter (HOSPITAL_COMMUNITY): Payer: Self-pay | Admitting: Internal Medicine

## 2022-06-08 DIAGNOSIS — K852 Alcohol induced acute pancreatitis without necrosis or infection: Secondary | ICD-10-CM | POA: Diagnosis not present

## 2022-06-08 LAB — COMPREHENSIVE METABOLIC PANEL
ALT: 89 U/L — ABNORMAL HIGH (ref 0–44)
AST: 53 U/L — ABNORMAL HIGH (ref 15–41)
Albumin: 3.9 g/dL (ref 3.5–5.0)
Alkaline Phosphatase: 59 U/L (ref 38–126)
Anion gap: 14 (ref 5–15)
BUN: 7 mg/dL (ref 6–20)
CO2: 22 mmol/L (ref 22–32)
Calcium: 8.9 mg/dL (ref 8.9–10.3)
Chloride: 98 mmol/L (ref 98–111)
Creatinine, Ser: 0.67 mg/dL (ref 0.61–1.24)
GFR, Estimated: >60 mL/min (ref >60)
Glucose, Bld: 93 mg/dL (ref 70–99)
Potassium: 4.3 mmol/L (ref 3.5–5.1)
Sodium: 134 mmol/L — ABNORMAL LOW (ref 135–145)
Total Bilirubin: 1.8 mg/dL — ABNORMAL HIGH (ref 0.3–1.2)
Total Protein: 6.8 g/dL (ref 6.5–8.1)

## 2022-06-08 LAB — CBC
HCT: 41.3 % (ref 39.0–52.0)
Hemoglobin: 14.2 g/dL (ref 13.0–17.0)
MCH: 33.3 pg (ref 26.0–34.0)
MCHC: 34.4 g/dL (ref 30.0–36.0)
MCV: 96.7 fL (ref 80.0–100.0)
Platelets: 160 10*3/uL (ref 150–400)
RBC: 4.27 MIL/uL (ref 4.22–5.81)
RDW: 12.4 % (ref 11.5–15.5)
WBC: 13.3 10*3/uL — ABNORMAL HIGH (ref 4.0–10.5)
nRBC: 0 % (ref 0.0–0.2)

## 2022-06-08 LAB — HIV ANTIBODY (ROUTINE TESTING W REFLEX): HIV Screen 4th Generation wRfx: NONREACTIVE

## 2022-06-08 MED ORDER — LACTATED RINGERS IV SOLN: INTRAVENOUS | Status: DC

## 2022-06-08 MED ORDER — INFLUENZA VAC SPLIT QUAD 0.5 ML IM SUSY
0.5000 mL | PREFILLED_SYRINGE | INTRAMUSCULAR | Status: AC
  Administered 2022-06-09: 0.5 mL via INTRAMUSCULAR
  Filled 2022-06-08: qty 0.5

## 2022-06-08 MED ORDER — PNEUMOCOCCAL 20-VAL CONJ VACC 0.5 ML IM SUSY
0.5000 mL | PREFILLED_SYRINGE | INTRAMUSCULAR | Status: AC
  Administered 2022-06-09: 0.5 mL via INTRAMUSCULAR
  Filled 2022-06-08: qty 0.5

## 2022-06-08 MED ORDER — LACTATED RINGERS IV BOLUS
1000.0000 mL | Freq: Once | INTRAVENOUS | Status: AC
  Administered 2022-06-08: 1000 mL via INTRAVENOUS

## 2022-06-08 NOTE — Progress Notes (Signed)
PROGRESS NOTE    Jeffrey Mathis  RKY:[id number] DOB: 02/09/1981 DOA: 06/07/2022 [facility]: [facility]   Brief Narrative:  Jeffrey Mathis is a 42 y.o. male with medical history significant for alcohol use disorder with history of alcohol associated pancreatitis, sinus bradycardia, PTSD, depression/anxiety, tobacco use who is admitted with acute alcohol associated pancreatitis.    Assessment & Plan:   Principal Problem:   Alcohol induced acute pancreatitis without necrosis or infection Active Problems:   Alcohol use disorder, severe, dependence (HCC)   Sinus bradycardia   Hepatic steatosis   Tobacco use  Assessment and Plan:  Alcohol induced acute pancreatitis without necrosis or infection Occurring after relapse and alcohol use.  Lipase is 289.  CT A/P with mild stranding around pancreatic head and neck.  LFTs mildly elevated. -N.p.o. except for sips and meds as tolerated -IV morphine as needed with hold parameters -IV Zofran as needed -Continue IV fluids with LR@125  mL/hour overnight; repeat bolus   Alcohol use disorder, severe, dependence (HCC) Unfortunately relapsed 2 weeks ago.  Last drink about 2 days prior to admission.  Does not appear to be actively withdrawing. -Continue CIWA protocol -TOC consult   Sinus bradycardia Chronic asymptomatic sinus bradycardia.  Monitor on telemetry with ongoing IV narcotics for pain control.   Tobacco use Reports smoking 1-2 cigarettes daily.  Declines nicotine patch.   Hepatic steatosis Again noted on CT imaging.  DVT prophylaxis:Lovenox Code Status: Full Family Communication: None at bedside Disposition Plan:  Status is: Inpatient Remains inpatient appropriate because: Aggressive IV fluid.   Consultants:  None  Procedures:  None  Antimicrobials:  None   Subjective: Patient seen and evaluated today with ongoing epigastric abdominal pain noted, however this is improving.  It seems to worsen with any oral  intake.  Denies any further nausea or vomiting.  He states he is having some mild shaking and withdrawal symptoms.  Objective: Vitals:   06/08/22 0414 06/08/22 0520 06/08/22 0600 06/08/22 0636  BP: 136/81 (!) 140/90 (!) 140/90   Pulse: (!) 46 (!) 46 (!) 45   Resp:   16   Temp:    98.9 F (37.2 C)  TempSrc:      SpO2:   95%   Weight:      Height:        Intake/Output Summary (Last 24 hours) at 06/08/2022 0652 Last data filed at 06/08/2022 0014 Gross per 24 hour  Intake 1000 ml  Output --  Net 1000 ml   Filed Weights   06/07/22 0832  Weight: 72.6 kg    Examination:  General exam: Appears calm and comfortable  Respiratory system: Clear to auscultation. Respiratory effort normal. Cardiovascular system: S1 & S2 heard, RRR.  Gastrointestinal system: Abdomen is soft Central nervous system: Alert and awake Extremities: No edema Skin: No significant lesions noted Psychiatry: Flat affect.    Data Reviewed: I have personally reviewed following labs and imaging studies  CBC: Recent Labs  Lab 06/07/22 0844 06/08/22 0411  WBC 12.9* 13.3*  HGB 16.9 14.2  HCT 48.9 41.3  MCV 97.0 96.7  PLT 220 160   Basic Metabolic Panel: Recent Labs  Lab 06/07/22 0844 06/08/22 0411  NA 136 134*  K 4.2 4.3  CL 98 98  CO2 23 22  GLUCOSE 91 93  BUN 7 7  CREATININE 0.85 0.67  CALCIUM 9.3 8.9   GFR: Estimated Creatinine Clearance: 127.3 mL/min (by C-G formula based on SCr of 0.67 mg/dL). Liver Function Tests: Recent Labs  Lab 06/07/22 0844 06/08/22 0411  AST 104* 53*  ALT 131* 89*  ALKPHOS 70 59  BILITOT 1.8* 1.8*  PROT 7.8 6.8  ALBUMIN 4.6 3.9   Recent Labs  Lab 06/07/22 0844  LIPASE 289*   No results for input(s): "AMMONIA" in the last 168 hours. Coagulation Profile: No results for input(s): "INR", "PROTIME" in the last 168 hours. Cardiac Enzymes: No results for input(s): "CKTOTAL", "CKMB", "CKMBINDEX", "TROPONINI" in the last 168 hours. BNP (last 3 results) No  results for input(s): "PROBNP" in the last 8760 hours. HbA1C: No results for input(s): "HGBA1C" in the last 72 hours. CBG: No results for input(s): "GLUCAP" in the last 168 hours. Lipid Profile: No results for input(s): "CHOL", "HDL", "LDLCALC", "TRIG", "CHOLHDL", "LDLDIRECT" in the last 72 hours. Thyroid Function Tests: No results for input(s): "TSH", "T4TOTAL", "FREET4", "T3FREE", "THYROIDAB" in the last 72 hours. Anemia Panel: No results for input(s): "VITAMINB12", "FOLATE", "FERRITIN", "TIBC", "IRON", "RETICCTPCT" in the last 72 hours. Sepsis Labs: No results for input(s): "PROCALCITON", "LATICACIDVEN" in the last 168 hours.  No results found for this or any previous visit (from the past 240 hour(s)).       Radiology Studies: CT ABDOMEN PELVIS W CONTRAST  Result Date: 06/07/2022 CLINICAL DATA:  Epigastric abdominal pain for 3 days. History of pancreatitis EXAM: CT ABDOMEN AND PELVIS WITH CONTRAST TECHNIQUE: Multidetector CT imaging of the abdomen and pelvis was performed using the standard protocol following bolus administration of intravenous contrast. RADIATION DOSE REDUCTION: This exam was performed according to the departmental dose-optimization program which includes automated exposure control, adjustment of the mA and/or kV according to patient size and/or use of iterative reconstruction technique. CONTRAST:  161mL OMNIPAQUE IOHEXOL 300 MG/ML  SOLN COMPARISON:  CT 11/03/2021 and older. MRI 11/04/2021. Ultrasound limited 02/03/2018. FINDINGS: Lower chest: Breathing motion at the lung bases. No pleural effusion. Hepatobiliary: Diffuse fatty liver infiltration. No enhancing liver lesion. Gallbladder is nondilated. Patent portal vein. Pancreas: Mild-to-moderate global pancreatic atrophy without mass lesion. Preserved enhancement. There is peripancreatic fat stranding particularly towards the head and neck region. Stranding also extends to the origin of the mesentery and the second portion  of the duodenum. Spleen: Spleen is nonenlarged.  Preserved enhancement. Adrenals/Urinary Tract: Adrenal glands are symmetric. No abnormal renal enhancement. No mass lesion or collecting system dilatation. Bladder is underdistended but otherwise preserved contour. Stomach/Bowel: On this non oral contrast exam the large bowel has a normal course and caliber. There is long segmental wall thickening along the right side of the colon, ascending and proximal transverse. Areas lower left side of the colon as well. Please correlate for a diffuse colitis. There is associated mesenteric vascular engorgement. The stomach and small bowel are nondilated. No free air or fluid collection. Vascular/Lymphatic: Normal caliber aorta and IVC. Mild vascular calcifications are identified. No developing abnormal lymph node enlargement seen in the abdomen and pelvis. Reproductive: Prostate is unremarkable. Other: No abdominal wall hernia or abnormality. No abdominopelvic ascites. Musculoskeletal: No acute or significant osseous findings. IMPRESSION: Multifocal areas of mild colonic wall thickening with vascular engorgement. Please correlate for colitis. There is some mild stranding around the pancreatic head and neck region and the second portion of the duodenum. Please correlate with any clinical evidence of pancreatitis. No well-defined fluid collections or areas of poor enhancement. Fatty liver infiltration Electronically Signed   By: Jill Side M.D.   On: 06/07/2022 10:06        Scheduled Meds:  enoxaparin (LOVENOX) injection  40 mg Subcutaneous  K99I   folic acid  1 mg Oral Daily   multivitamin with minerals  1 tablet Oral Daily   sodium chloride flush  3 mL Intravenous Q12H   thiamine  100 mg Oral Daily   Or   thiamine  100 mg Intravenous Daily   Continuous Infusions:  lactated ringers 125 mL/hr at 06/07/22 2335     LOS: 1 day    Time spent: 35 minutes    Jidenna Figgs Darleen Crocker, DO [facility]  If 7PM-7AM,  please contact night-coverage [email or web address] 06/08/2022, 6:52 AM

## 2022-06-09 DIAGNOSIS — K852 Alcohol induced acute pancreatitis without necrosis or infection: Secondary | ICD-10-CM | POA: Diagnosis not present

## 2022-06-09 LAB — CBC
HCT: 40.7 % (ref 39.0–52.0)
Hemoglobin: 14.1 g/dL (ref 13.0–17.0)
MCH: 33.4 pg (ref 26.0–34.0)
MCHC: 34.6 g/dL (ref 30.0–36.0)
MCV: 96.4 fL (ref 80.0–100.0)
Platelets: 122 10*3/uL — ABNORMAL LOW (ref 150–400)
RBC: 4.22 MIL/uL (ref 4.22–5.81)
RDW: 12.2 % (ref 11.5–15.5)
WBC: 6.9 10*3/uL (ref 4.0–10.5)
nRBC: 0 % (ref 0.0–0.2)

## 2022-06-09 LAB — COMPREHENSIVE METABOLIC PANEL
ALT: 63 U/L — ABNORMAL HIGH (ref 0–44)
AST: 38 U/L (ref 15–41)
Albumin: 3.7 g/dL (ref 3.5–5.0)
Alkaline Phosphatase: 57 U/L (ref 38–126)
Anion gap: 7 (ref 5–15)
BUN: <5 mg/dL — ABNORMAL LOW (ref 6–20)
CO2: 29 mmol/L (ref 22–32)
Calcium: 9.3 mg/dL (ref 8.9–10.3)
Chloride: 100 mmol/L (ref 98–111)
Creatinine, Ser: 0.64 mg/dL (ref 0.61–1.24)
GFR, Estimated: >60 mL/min (ref >60)
Glucose, Bld: 98 mg/dL (ref 70–99)
Potassium: 3.8 mmol/L (ref 3.5–5.1)
Sodium: 136 mmol/L (ref 135–145)
Total Bilirubin: 1.3 mg/dL — ABNORMAL HIGH (ref 0.3–1.2)
Total Protein: 6.4 g/dL — ABNORMAL LOW (ref 6.5–8.1)

## 2022-06-09 LAB — MAGNESIUM: Magnesium: 1.9 mg/dL (ref 1.7–2.4)

## 2022-06-09 MED ORDER — FOLIC ACID 1 MG PO TABS: 1.0000 mg | ORAL_TABLET | Freq: Every day | ORAL | 0 refills | Status: AC

## 2022-06-09 MED ORDER — VITAMIN B-1 100 MG PO TABS: 100.0000 mg | ORAL_TABLET | Freq: Every day | ORAL | 0 refills | Status: AC

## 2022-06-09 MED ORDER — CHLORDIAZEPOXIDE HCL 5 MG PO CAPS: 5.0000 mg | ORAL_CAPSULE | Freq: Three times a day (TID) | ORAL | 0 refills | Status: DC | PRN

## 2022-06-09 MED ORDER — ADULT MULTIVITAMIN W/MINERALS CH: 1.0000 | ORAL_TABLET | Freq: Every day | ORAL | 0 refills | Status: AC

## 2022-06-09 MED ORDER — ONDANSETRON HCL 4 MG PO TABS: 4.0000 mg | ORAL_TABLET | Freq: Every day | ORAL | 1 refills | Status: DC | PRN

## 2022-06-09 NOTE — TOC Progression Note (Signed)
Transition of Care [facility]) - Progression Note    Patient Details  Name: Jeffrey Mathis MRN: [id number] Date of Birth: 1980/08/28  Transition of Care [facility]) CM/SW Contact  Henrietta Dine, RN Phone Number: 06/09/2022, 5:39 PM  Clinical Narrative:    [facility] consult for SA counseling/education; pt d/c'd before assessment could be completed.         Expected Discharge Plan and Services         Expected Discharge Date: 06/09/22                                     Social Determinants of Health (SDOH) Interventions SDOH Screenings   Food Insecurity: No Food Insecurity (06/08/2022)  Housing: Low Risk  (06/08/2022)  Transportation Needs: No Transportation Needs (06/08/2022)  Utilities: Not At Risk (06/08/2022)  Tobacco Use: High Risk (06/08/2022)    Readmission Risk Interventions     No data to display

## 2022-06-09 NOTE — Discharge Summary (Signed)
Physician Discharge Summary  Jeffrey Mathis [id number]:[id number] DOB: 08/24/1980 DOA: 06/07/2022  [facility]P: [facility]  Admit date: 06/07/2022  Discharge date: 06/09/2022  Admitted From:Home  Disposition:  Home  Recommendations for Outpatient Follow-up:  Follow up with PCP in 1-2 weeks Remain on medications as prescribed and as needed for anxiety and withdrawal.  Librium provided. Patient will seek further alcohol abuse counseling  Home Health: None  Equipment/Devices: None  Discharge Condition:Stable  CODE STATUS: Full  Diet recommendation: Low-fat diet  Brief/Interim Summary: Jeffrey Mathis is a 42 y.o. male with medical history significant for alcohol use disorder with history of alcohol associated pancreatitis, sinus bradycardia, PTSD, depression/anxiety, tobacco use who is admitted with acute alcohol associated pancreatitis.  CT abdomen with no acute findings and he received aggressive IV fluid and remained n.p.o. for approximately 24 hours.  He is now in stable condition for discharge and is able to tolerate diet with no further symptomatology noted.  He has not had any alcohol withdrawal symptoms noted during this hospitalization either.  Discharge Diagnoses:  Principal Problem:   Alcohol induced acute pancreatitis without necrosis or infection Active Problems:   Alcohol use disorder, severe, dependence (HCC)   Sinus bradycardia   Hepatic steatosis   Tobacco use  Principal discharge diagnosis: Acute pancreatitis-alcohol induced.  Discharge Instructions  Discharge Instructions     Diet - low sodium heart healthy   Complete by: As directed    Increase activity slowly   Complete by: As directed       Allergies as of 06/09/2022   No Known Allergies      Medication List     TAKE these medications    chlordiazePOXIDE 5 MG capsule Commonly known as: LIBRIUM Take 1 capsule (5 mg total) by mouth 3 (three) times daily as needed for anxiety or withdrawal.    doxylamine (Sleep) 25 MG tablet Commonly known as: UNISOM Take 25 mg by mouth at bedtime as needed for sleep.   folic acid 1 MG tablet Commonly known as: FOLVITE Take 1 tablet (1 mg total) by mouth daily. Start taking on: June 10, 2022   multivitamin with minerals Tabs tablet Take 1 tablet by mouth daily. Start taking on: June 10, 2022   ondansetron 4 MG tablet Commonly known as: Zofran Take 1 tablet (4 mg total) by mouth daily as needed for nausea or vomiting.   oxyCODONE 5 MG immediate release tablet Commonly known as: Oxy IR/ROXICODONE Take 1-2 tablets (5-10 mg total) by mouth every 6 (six) hours as needed.   pantoprazole 40 MG tablet Commonly known as: Protonix Take 1 tablet (40 mg total) by mouth daily.   thiamine 100 MG tablet Commonly known as: Vitamin B-1 Take 1 tablet (100 mg total) by mouth daily. Start taking on: June 10, 2022        Follow-up Information     [facility] Schedule an appointment as soon as possible for a visit in 1 week(s).   Contact information: [address] [facility] [place] [address] [phone]                No Known Allergies  Consultations: None   Procedures/Studies: CT ABDOMEN PELVIS W CONTRAST  Result Date: 06/07/2022 CLINICAL DATA:  Epigastric abdominal pain for 3 days. History of pancreatitis EXAM: CT ABDOMEN AND PELVIS WITH CONTRAST TECHNIQUE: Multidetector CT imaging of the abdomen and pelvis was performed using the standard protocol following bolus administration of intravenous contrast. RADIATION DOSE REDUCTION: This exam was performed  according to the departmental dose-optimization program which includes automated exposure control, adjustment of the mA and/or kV according to patient size and/or use of iterative reconstruction technique. CONTRAST:  OMNIPAQUE IOHEXOL 300 MG/ML  SOLN COMPARISON:  CT 11/03/2021 and older. MRI 11/04/2021. Ultrasound limited 02/03/2018. FINDINGS:  Lower chest: Breathing motion at the lung bases. No pleural effusion. Hepatobiliary: Diffuse fatty liver infiltration. No enhancing liver lesion. Gallbladder is nondilated. Patent portal vein. Pancreas: Mild-to-moderate global pancreatic atrophy without mass lesion. Preserved enhancement. There is peripancreatic fat stranding particularly towards the head and neck region. Stranding also extends to the origin of the mesentery and the second portion of the duodenum. Spleen: Spleen is nonenlarged.  Preserved enhancement. Adrenals/Urinary Tract: Adrenal glands are symmetric. No abnormal renal enhancement. No mass lesion or collecting system dilatation. Bladder is underdistended but otherwise preserved contour. Stomach/Bowel: On this non oral contrast exam the large bowel has a normal course and caliber. There is long segmental wall thickening along the right side of the colon, ascending and proximal transverse. Areas lower left side of the colon as well. Please correlate for a diffuse colitis. There is associated mesenteric vascular engorgement. The stomach and small bowel are nondilated. No free air or fluid collection. Vascular/Lymphatic: Normal caliber aorta and IVC. Mild vascular calcifications are identified. No developing abnormal lymph node enlargement seen in the abdomen and pelvis. Reproductive: Prostate is unremarkable. Other: No abdominal wall hernia or abnormality. No abdominopelvic ascites. Musculoskeletal: No acute or significant osseous findings. IMPRESSION: Multifocal areas of mild colonic wall thickening with vascular engorgement. Please correlate for colitis. There is some mild stranding around the pancreatic head and neck region and the second portion of the duodenum. Please correlate with any clinical evidence of pancreatitis. No well-defined fluid collections or areas of poor enhancement. Fatty liver infiltration Electronically Signed   By: Karen Kays M.D.   On: 06/07/2022 10:06     Discharge  Exam: Vitals:   06/08/22 2340 06/09/22 0356  BP: (!) 137/99 124/89  Pulse: 69 (!) 54  Resp: 16 18  Temp: 99 F (37.2 C) 98.2 F (36.8 C)  SpO2: 97% 98%   Vitals:   06/08/22 1535 06/08/22 1923 06/08/22 2340 06/09/22 0356  BP: (!) 161/91 (!) 137/93 (!) 137/99 124/89  Pulse: (!) 51 (!) 57 69 (!) 54  Resp: 17 18 16 18   Temp: 97.8 F (36.6 C) 98.6 F (37 C) 99 F (37.2 C) 98.2 F (36.8 C)  TempSrc: Oral Oral    SpO2: 98% 99% 97% 98%  Weight:      Height:        General: Pt is alert, awake, not in acute distress Cardiovascular: RRR, S1/S2 +, no rubs, no gallops Respiratory: CTA bilaterally, no wheezing, no rhonchi Abdominal: Soft, NT, ND, bowel sounds + Extremities: no edema, no cyanosis    The results of significant diagnostics from this hospitalization (including imaging, microbiology, ancillary and laboratory) are listed below for reference.     Microbiology: No results found for this or any previous visit (from the past 240 hour(s)).   Labs: BNP (last 3 results) No results for input(s): "BNP" in the last 8760 hours. Basic Metabolic Panel: Recent Labs  Lab 06/07/22 0844 06/08/22 0411 06/09/22 0548  NA 136 134* 136  K 4.2 4.3 3.8  CL 98 98 100  CO2 23 22 29   GLUCOSE 91 93 98  BUN 7 7 <5*  CREATININE 0.85 0.67 0.64  CALCIUM 9.3 8.9 9.3  MG  --   --  1.9   Liver Function Tests: Recent Labs  Lab 06/07/22 0844 06/08/22 0411 06/09/22 0548  AST 104* 53* 38  ALT 131* 89* 63*  ALKPHOS 70 59 57  BILITOT 1.8* 1.8* 1.3*  PROT 7.8 6.8 6.4*  ALBUMIN 4.6 3.9 3.7   Recent Labs  Lab 06/07/22 0844  LIPASE 289*   No results for input(s): "AMMONIA" in the last 168 hours. CBC: Recent Labs  Lab 06/07/22 0844 06/08/22 0411 06/09/22 0548  WBC 12.9* 13.3* 6.9  HGB 16.9 14.2 14.1  HCT 48.9 41.3 40.7  MCV 97.0 96.7 96.4  PLT 220 160 122*   Cardiac Enzymes: No results for input(s): "CKTOTAL", "CKMB", "CKMBINDEX", "TROPONINI" in the last 168  hours. BNP: Invalid input(s): "POCBNP" CBG: No results for input(s): "GLUCAP" in the last 168 hours. D-Dimer No results for input(s): "DDIMER" in the last 72 hours. Hgb A1c No results for input(s): "HGBA1C" in the last 72 hours. Lipid Profile No results for input(s): "CHOL", "HDL", "LDLCALC", "TRIG", "CHOLHDL", "LDLDIRECT" in the last 72 hours. Thyroid function studies No results for input(s): "TSH", "T4TOTAL", "T3FREE", "THYROIDAB" in the last 72 hours.  Invalid input(s): "FREET3" Anemia work up No results for input(s): "VITAMINB12", "FOLATE", "FERRITIN", "TIBC", "IRON", "RETICCTPCT" in the last 72 hours. Urinalysis    Component Value Date/Time   COLORURINE AMBER (A) 06/07/2022 0834   APPEARANCEUR HAZY (A) 06/07/2022 0834   LABSPEC 1.028 06/07/2022 0834   PHURINE 5.0 06/07/2022 0834   GLUCOSEU NEGATIVE 06/07/2022 0834   HGBUR NEGATIVE 06/07/2022 0834   BILIRUBINUR SMALL (A) 06/07/2022 0834   KETONESUR 80 (A) 06/07/2022 0834   PROTEINUR 100 (A) 06/07/2022 0834   NITRITE NEGATIVE 06/07/2022 0834   LEUKOCYTESUR NEGATIVE 06/07/2022 0834   Sepsis Labs Recent Labs  Lab 06/07/22 0844 06/08/22 0411 06/09/22 0548  WBC 12.9* 13.3* 6.9   Microbiology No results found for this or any previous visit (from the past 240 hour(s)).   Time coordinating discharge: 35 minutes  SIGNED:   Rodena Goldmann, DO [facility] 06/09/2022, 9:59 AM  If 7PM-7AM, please contact night-coverage [email or web address]

## 2022-06-10 ENCOUNTER — Emergency Department (HOSPITAL_COMMUNITY)
Admission: EM | Admit: 2022-06-10 | Discharge: 2022-06-10 | Disposition: A | Discharge status: Home / Self Care | Payer: No Typology Code available for payment source | Attending: Emergency Medicine | Admitting: Emergency Medicine

## 2022-06-10 ENCOUNTER — Other Ambulatory Visit: Payer: Self-pay

## 2022-06-10 ENCOUNTER — Encounter (HOSPITAL_COMMUNITY): Payer: Self-pay

## 2022-06-10 ENCOUNTER — Emergency Department (HOSPITAL_COMMUNITY): Payer: No Typology Code available for payment source

## 2022-06-10 DIAGNOSIS — R1013 Epigastric pain: Secondary | ICD-10-CM | POA: Diagnosis present

## 2022-06-10 DIAGNOSIS — K852 Alcohol induced acute pancreatitis without necrosis or infection: Secondary | ICD-10-CM | POA: Insufficient documentation

## 2022-06-10 LAB — CBC WITH DIFFERENTIAL/PLATELET
Abs Immature Granulocytes: 0.03 10*3/uL (ref 0.00–0.07)
Basophils Absolute: 0.1 10*3/uL (ref 0.0–0.1)
Basophils Relative: 1 %
Eosinophils Absolute: 0.1 10*3/uL (ref 0.0–0.5)
Eosinophils Relative: 1 %
HCT: 44.5 % (ref 39.0–52.0)
Hemoglobin: 15.6 g/dL (ref 13.0–17.0)
Immature Granulocytes: 0 %
Lymphocytes Relative: 17 %
Lymphs Abs: 1.3 10*3/uL (ref 0.7–4.0)
MCH: 33.7 pg (ref 26.0–34.0)
MCHC: 35.1 g/dL (ref 30.0–36.0)
MCV: 96.1 fL (ref 80.0–100.0)
Monocytes Absolute: 0.7 10*3/uL (ref 0.1–1.0)
Monocytes Relative: 9 %
Neutro Abs: 5.7 10*3/uL (ref 1.7–7.7)
Neutrophils Relative %: 72 %
Platelets: 160 10*3/uL (ref 150–400)
RBC: 4.63 MIL/uL (ref 4.22–5.81)
RDW: 12 % (ref 11.5–15.5)
WBC: 7.9 10*3/uL (ref 4.0–10.5)
nRBC: 0 % (ref 0.0–0.2)

## 2022-06-10 LAB — LIPASE, BLOOD: Lipase: 153 U/L — ABNORMAL HIGH (ref 11–51)

## 2022-06-10 LAB — COMPREHENSIVE METABOLIC PANEL
ALT: 124 U/L — ABNORMAL HIGH (ref 0–44)
AST: 145 U/L — ABNORMAL HIGH (ref 15–41)
Albumin: 4.1 g/dL (ref 3.5–5.0)
Alkaline Phosphatase: 60 U/L (ref 38–126)
Anion gap: 10 (ref 5–15)
BUN: 9 mg/dL (ref 6–20)
CO2: 22 mmol/L (ref 22–32)
Calcium: 9.3 mg/dL (ref 8.9–10.3)
Chloride: 105 mmol/L (ref 98–111)
Creatinine, Ser: 0.74 mg/dL (ref 0.61–1.24)
GFR, Estimated: >60 mL/min (ref >60)
Glucose, Bld: 91 mg/dL (ref 70–99)
Potassium: 3.4 mmol/L — ABNORMAL LOW (ref 3.5–5.1)
Sodium: 137 mmol/L (ref 135–145)
Total Bilirubin: 1.2 mg/dL (ref 0.3–1.2)
Total Protein: 7.2 g/dL (ref 6.5–8.1)

## 2022-06-10 MED ORDER — ONDANSETRON HCL 4 MG/2ML IJ SOLN
4.0000 mg | Freq: Once | INTRAMUSCULAR | Status: AC
  Administered 2022-06-10: 4 mg via INTRAVENOUS
  Filled 2022-06-10: qty 2

## 2022-06-10 MED ORDER — KETOROLAC TROMETHAMINE 30 MG/ML IJ SOLN
15.0000 mg | Freq: Once | INTRAMUSCULAR | Status: AC
  Administered 2022-06-10: 15 mg via INTRAVENOUS
  Filled 2022-06-10: qty 1

## 2022-06-10 MED ORDER — OXYCODONE-ACETAMINOPHEN 5-325 MG PO TABS
1.0000 | ORAL_TABLET | Freq: Once | ORAL | Status: AC
  Administered 2022-06-10: 1 via ORAL
  Filled 2022-06-10: qty 1

## 2022-06-10 MED ORDER — OXYCODONE HCL 5 MG PO TABS: 5.0000 mg | ORAL_TABLET | Freq: Four times a day (QID) | ORAL | 0 refills | Status: DC | PRN

## 2022-06-10 MED ORDER — SODIUM CHLORIDE 0.9 % IV BOLUS
1000.0000 mL | Freq: Once | INTRAVENOUS | Status: AC
  Administered 2022-06-10: 1000 mL via INTRAVENOUS

## 2022-06-10 NOTE — Discharge Instructions (Addendum)
The workup today was overall reassuring.  As discussed, we will send in medication to help with the pain.  Take for breakthrough pain.  Please be careful with this medication and concomitant use with your Librium as this can cause severe sedation, decreased respiratory drive, increased overdose risk, increased mortality risk. Take ibuprofen/Motrin as needed for baseline pain.  As recommended, continue with liquid diet until you can tolerate pured and then solid foods subsequently.  Recommend follow-up with primary care for reassessment of your symptoms.  Please not hesitate to return to emergency department for worrisome signs and symptoms we discussed become apparent.

## 2022-06-10 NOTE — ED Triage Notes (Signed)
Patient reports that he was discharged from the hospital with pancreatitis yesterday. Patient states he began having abdominal pain, N/V/D.

## 2022-06-10 NOTE — ED Provider Triage Note (Signed)
Emergency Medicine Provider Triage Evaluation Note  Jeffrey Mathis , a 42 y.o. male  was evaluated in triage.  Pt complains of uncontrolled nausea, vomiting, and upper abdominal pain since discharge from hospital yesterday after admission for pancreatitis. Reports he has been hospitalized 3 other times before for pancreatitis and required longer hospital stay to get better control of his symptoms. Attempted to eat post discharge yesterday when symptoms flared again and has not been able to tolerate any PO intake since that time. Taking all medications prescribed at discharge including Librium for history of alcohol use disorder. Had Zofran earlier today and nausea currently well controlled. States he has some chest "pressure" described as "at the bases of my lungs" pointing to upper abdomen with this complaint and says when he has this symptom he feels short of breath. No fever, chills, diarrhea, or back pain. Denies alcohol use since dc from hospital. Last EtOH 1/1.   Review of Systems  Positive: See HPI Negative: See HPI  Physical Exam  BP (!) 133/108 (BP Location: Left Arm)   Pulse 90   Temp 98.1 F (36.7 C) (Oral)   Resp 18   Ht 6' (1.829 m)   Wt 72.6 kg   SpO2 98%   BMI 21.70 kg/m  Gen:   Awake, no distress   Resp:  Normal effort LCTA MSK:   Moves extremities without difficulty  Other:  RRR, moderate epigastric and LUQ abd tenderness  Medical Decision Making  Medically screening exam initiated at 4:15 PM.  Appropriate orders placed.  Candido Flott was informed that the remainder of the evaluation will be completed by another provider, this initial triage assessment does not replace that evaluation, and the importance of remaining in the ED until their evaluation is complete.     Suzzette Righter, PA-C 06/10/22 0998

## 2022-06-10 NOTE — ED Provider Notes (Signed)
[facility]-EMERGENCY DEPT Provider Note   CSN: [id number] Arrival date & time: 06/10/22  1531     History  Chief Complaint  Patient presents with   Abdominal Pain   Emesis   Diarrhea    Jeffrey Mathis is a 42 y.o. male.   Abdominal Pain Associated symptoms: diarrhea and vomiting   Emesis Associated symptoms: abdominal pain and diarrhea   Diarrhea Associated symptoms: abdominal pain and vomiting     42 year old male presents emergency department with continued epigastric pain, nausea.  Patient was most recently discharged from the hospital yesterday around 10 AM.  Was admitted for alcohol induced pancreatitis.  Patient states that while he was at home, had difficulty tolerating p.o.  States he was not sent home with any pain medicine or nausea medicine so he did not eat.  He tried to sip on some liquids earlier today and noted immediately return of epigastric pain as well as feelings of nausea prompting his emergency department.  Denies fever, chills, night sweats, chest pain, shortness of breath, urinary symptoms, change in bowel habits.  Declines any alcohol use since discharge.  Past medical 3 significant for alcohol induced pancreatitis, hepatic steatosis, sinus bradycardia, PTSD  Home Medications Prior to Admission medications   Medication Sig Start Date End Date Taking? Authorizing Provider  oxyCODONE (ROXICODONE) 5 MG immediate release tablet Take 1 tablet (5 mg total) by mouth every 6 (six) hours as needed for severe pain or breakthrough pain. 06/10/22  Yes Sherian Maroon A, PA  chlordiazePOXIDE (LIBRIUM) 5 MG capsule Take 1 capsule (5 mg total) by mouth 3 (three) times daily as needed for anxiety or withdrawal. 06/09/22   Sherryll Burger, Pratik D, DO  doxylamine, Sleep, (UNISOM) 25 MG tablet Take 25 mg by mouth at bedtime as needed for sleep.    [provider]  folic acid (FOLVITE) 1 MG tablet Take 1 tablet (1 mg total) by mouth daily. 06/10/22 07/10/22   Maurilio Lovely D, DO  Multiple Vitamin (MULTIVITAMIN WITH MINERALS) TABS tablet Take 1 tablet by mouth daily. 06/10/22 07/10/22  Sherryll Burger, Pratik D, DO  ondansetron (ZOFRAN) 4 MG tablet Take 1 tablet (4 mg total) by mouth daily as needed for nausea or vomiting. 06/09/22 06/09/23  Sherryll Burger, Pratik D, DO  pantoprazole (PROTONIX) 40 MG tablet Take 1 tablet (40 mg total) by mouth daily. 11/06/21 12/06/21  Glade Lloyd, MD  thiamine (VITAMIN B-1) 100 MG tablet Take 1 tablet (100 mg total) by mouth daily. 06/10/22 07/10/22  Maurilio Lovely D, DO      Allergies    Patient has no known allergies.    Review of Systems   Review of Systems  Gastrointestinal:  Positive for abdominal pain, diarrhea and vomiting.  All other systems reviewed and are negative.   Physical Exam Updated Vital Signs BP (!) 143/97   Pulse 87   Temp 98.1 F (36.7 C)   Resp 17   Ht 6' (1.829 m)   Wt 72.6 kg   SpO2 98%   BMI 21.70 kg/m  Physical Exam Vitals and nursing note reviewed.  Constitutional:      General: He is not in acute distress.    Appearance: He is well-developed.  HENT:     Head: Normocephalic and atraumatic.  Eyes:     Conjunctiva/sclera: Conjunctivae normal.  Cardiovascular:     Rate and Rhythm: Normal rate and regular rhythm.     Heart sounds: No murmur heard. Pulmonary:     Effort: Pulmonary effort  is normal. No respiratory distress.     Breath sounds: Normal breath sounds.  Abdominal:     Palpations: Abdomen is soft.     Tenderness: There is abdominal tenderness in the epigastric area. There is no right CVA tenderness or left CVA tenderness.  Musculoskeletal:        General: No swelling.     Cervical back: Neck supple.  Skin:    General: Skin is warm and dry.     Capillary Refill: Capillary refill takes less than 2 seconds.  Neurological:     Mental Status: He is alert.  Psychiatric:        Mood and Affect: Mood normal.     ED Results / Procedures / Treatments   Labs (all labs ordered are listed, but  only abnormal results are displayed) Labs Reviewed  COMPREHENSIVE METABOLIC PANEL - Abnormal; Notable for the following components:      Result Value   Potassium 3.4 (*)    AST 145 (*)    ALT 124 (*)    All other components within normal limits  LIPASE, BLOOD - Abnormal; Notable for the following components:   Lipase 153 (*)    All other components within normal limits  CBC WITH DIFFERENTIAL/PLATELET  URINALYSIS, ROUTINE W REFLEX MICROSCOPIC    EKG None  Radiology DG Chest 2 View  Result Date: 06/10/2022 CLINICAL DATA:  chest pain EXAM: CHEST - 2 VIEW COMPARISON:  11/03/2021. FINDINGS: Cardiac silhouette is unremarkable. No pneumothorax or pleural effusion. The lungs are clear. The visualized skeletal structures are unremarkable. IMPRESSION: No acute cardiopulmonary process. Electronically Signed   By: Sammie Bench M.D.   On: 06/10/2022 16:54    Procedures Procedures    Medications Ordered in ED Medications  sodium chloride 0.9 % bolus 1,000 mL (0 mLs Intravenous Stopped 06/10/22 1946)  ondansetron (ZOFRAN) injection 4 mg (4 mg Intravenous Given 06/10/22 1759)  ketorolac (TORADOL) 30 MG/ML injection 15 mg (15 mg Intravenous Given 06/10/22 1804)  oxyCODONE-acetaminophen (PERCOCET/ROXICET) 5-325 MG per tablet 1 tablet (1 tablet Oral Given 06/10/22 1821)    ED Course/ Medical Decision Making/ A&P Clinical Course as of 06/10/22 2128  Nancy Fetter Jun 10, 2022  1926 Reassessment of the patient showed significant improvement of symptoms.  Patient tolerating liquids pending broth/soup trial.  Patient feeling well enough to be discharged. [CR]  2009 Reassessment of the patient showed significant improvement in symptoms.  Patient tolerating broth/soup without difficulty.  He is requesting discharge. [CR]    Clinical Course User Index [CR] Wilnette Kales, PA                           Medical Decision Making Risk Prescription drug management.   This patient presents to the ED for concern  of abdominal pain, this involves an extensive number of treatment options, and is a complaint that carries with it a high risk of complications and morbidity.  The differential diagnosis includes pancreatitis, SBO/LBO, gastritis, PUD/CBD pathology, cholecystitis, pancreatitis, diverticulitis, appendicitis, volvulus, pyelonephritis, cystitis, AAA, mesenteric ischemia   Co morbidities that complicate the patient evaluation  See HPI   Additional history obtained:  Additional history obtained from EMR External records from outside source obtained and reviewed including hospital records   Lab Tests:  I Ordered, and personally interpreted labs.  The pertinent results include: Mild decrease in potassium 3.4 but otherwise electrolytes within normal limits.  No renal dysfunction.  Elevation AST and ALT of 145  and 124 respectively.  No leukocytosis.  No evidence of anemia.  Platelets within normal limits.  Lipase elevated 153 which has improved from prior study 3 to 4 days ago.  Imaging Studies ordered:  I ordered imaging studies including chest x-ray I independently visualized and interpreted imaging which showed no acute cardiopulmonary abnormality I agree with the radiologist interpretation   Cardiac Monitoring: / EKG:  The patient was maintained on a cardiac monitor.  I personally viewed and interpreted the cardiac monitored which showed an underlying rhythm of: Sinus rhythm without acute ischemic change   Consultations Obtained:  N/a   Problem List / ED Course / Critical interventions / Medication management  Pancreatitis I ordered medication including Zofran for nausea, Toradol/oxycodone for pain.    Reevaluation of the patient after these medicines showed that the patient improved I have reviewed the patients home medicines and have made adjustments as needed   Social Determinants of Health:  Chronic cigarette use.  Denies illicit drug use.   Test / Admission -  Considered:  Pancreatitis Vitals signs significant for mild hypertension with blood pressure 143/97.  Recommend follow-up with primary care regarding elevation of blood pressure.. Otherwise within normal range and stable throughout visit. Laboratory/imaging studies significant for: See above Patient with evidence of pancreatitis.  Patient states has not been tolerated p.o. which prompted his return to the emergency department.  Upon administration of pain medication, patient tolerated p.o. in the emergency department without difficulty.  Patient currently on Librium taper for prevention of alcohol withdrawal symptoms. Discussed at length with the patient regarding admission to hospital for intravenous pain medication versus oral pain medication outpatient with risk of current benzo use.  Patient elected for outpatient management of symptoms after passage of p.o. trial without increasing pain, subsequent nausea/emesis.  Patient I had a lengthy conversation regarding potential harmful side effects of concurrent medication use along with concern for his specific history of alcohol use.  Patient reassured alcohol avoidance as well as monitoring of symptoms with concomitant prior use of Librium.  Discussed adherence with liquid diet for the next several days and slowly increasing to pure and then soft foods as tolerated with Tylenol/Motrin for baseline pain with sparing use of oxycodone given current Librium medication use. Treatment plan discussed at length with patient and he acknowledged understanding was agreeable to said plan.   Worrisome signs and symptoms were discussed with the patient, and the patient acknowledged understanding to return to the ED if noticed. Patient was stable upon discharge.          Final Clinical Impression(s) / ED Diagnoses Final diagnoses:  Alcohol-induced acute pancreatitis, unspecified complication status    Rx / DC Orders ED Discharge Orders          Ordered     oxyCODONE (ROXICODONE) 5 MG immediate release tablet  Every 6 hours PRN        06/10/22 2014              Peter Garter, [place] 06/10/22 2128    Cathren Laine, MD 06/14/22 1610

## 2022-06-10 NOTE — ED Notes (Signed)
Patient requesting lab draw once roomed.

## 2023-04-30 ENCOUNTER — Emergency Department (HOSPITAL_COMMUNITY)
Admission: EM | Admit: 2023-04-30 | Discharge: 2023-04-30 | Disposition: A | Discharge status: Home / Self Care | Payer: No Typology Code available for payment source | Attending: Emergency Medicine | Admitting: Emergency Medicine

## 2023-04-30 ENCOUNTER — Encounter (HOSPITAL_COMMUNITY): Payer: Self-pay

## 2023-04-30 ENCOUNTER — Other Ambulatory Visit: Payer: Self-pay

## 2023-04-30 ENCOUNTER — Emergency Department (HOSPITAL_COMMUNITY): Payer: No Typology Code available for payment source

## 2023-04-30 ENCOUNTER — Inpatient Hospital Stay (HOSPITAL_COMMUNITY)
Admission: EM | Admit: 2023-04-30 | Discharge: 2023-05-02 | DRG: 439 | Disposition: A | Discharge status: Home / Self Care | Payer: No Typology Code available for payment source | Attending: Family Medicine | Admitting: Family Medicine

## 2023-04-30 DIAGNOSIS — F17210 Nicotine dependence, cigarettes, uncomplicated: Secondary | ICD-10-CM | POA: Diagnosis present

## 2023-04-30 DIAGNOSIS — F4310 Post-traumatic stress disorder, unspecified: Secondary | ICD-10-CM | POA: Diagnosis present

## 2023-04-30 DIAGNOSIS — Z818 Family history of other mental and behavioral disorders: Secondary | ICD-10-CM

## 2023-04-30 DIAGNOSIS — K760 Fatty (change of) liver, not elsewhere classified: Secondary | ICD-10-CM | POA: Diagnosis not present

## 2023-04-30 DIAGNOSIS — Z8379 Family history of other diseases of the digestive system: Secondary | ICD-10-CM

## 2023-04-30 DIAGNOSIS — K861 Other chronic pancreatitis: Secondary | ICD-10-CM | POA: Diagnosis present

## 2023-04-30 DIAGNOSIS — R1013 Epigastric pain: Secondary | ICD-10-CM | POA: Diagnosis present

## 2023-04-30 DIAGNOSIS — R1110 Vomiting, unspecified: Secondary | ICD-10-CM | POA: Diagnosis not present

## 2023-04-30 DIAGNOSIS — E871 Hypo-osmolality and hyponatremia: Secondary | ICD-10-CM | POA: Diagnosis not present

## 2023-04-30 DIAGNOSIS — R030 Elevated blood-pressure reading, without diagnosis of hypertension: Secondary | ICD-10-CM | POA: Diagnosis present

## 2023-04-30 DIAGNOSIS — I8500 Esophageal varices without bleeding: Secondary | ICD-10-CM | POA: Diagnosis not present

## 2023-04-30 DIAGNOSIS — Z1152 Encounter for screening for COVID-19: Secondary | ICD-10-CM

## 2023-04-30 DIAGNOSIS — F1090 Alcohol use, unspecified, uncomplicated: Secondary | ICD-10-CM | POA: Insufficient documentation

## 2023-04-30 DIAGNOSIS — K859 Acute pancreatitis without necrosis or infection, unspecified: Principal | ICD-10-CM | POA: Diagnosis present

## 2023-04-30 DIAGNOSIS — K860 Alcohol-induced chronic pancreatitis: Secondary | ICD-10-CM | POA: Diagnosis not present

## 2023-04-30 DIAGNOSIS — F1011 Alcohol abuse, in remission: Secondary | ICD-10-CM | POA: Diagnosis present

## 2023-04-30 DIAGNOSIS — K766 Portal hypertension: Secondary | ICD-10-CM | POA: Insufficient documentation

## 2023-04-30 DIAGNOSIS — K5900 Constipation, unspecified: Secondary | ICD-10-CM | POA: Diagnosis present

## 2023-04-30 DIAGNOSIS — R7401 Elevation of levels of liver transaminase levels: Secondary | ICD-10-CM | POA: Insufficient documentation

## 2023-04-30 DIAGNOSIS — D7589 Other specified diseases of blood and blood-forming organs: Secondary | ICD-10-CM | POA: Diagnosis present

## 2023-04-30 DIAGNOSIS — F32A Depression, unspecified: Secondary | ICD-10-CM | POA: Diagnosis present

## 2023-04-30 DIAGNOSIS — F419 Anxiety disorder, unspecified: Secondary | ICD-10-CM | POA: Diagnosis present

## 2023-04-30 LAB — URINALYSIS, ROUTINE W REFLEX MICROSCOPIC
Bacteria, UA: NONE SEEN
Glucose, UA: NEGATIVE mg/dL
Hgb urine dipstick: NEGATIVE
Ketones, ur: 20 mg/dL — AB
Leukocytes,Ua: NEGATIVE
Nitrite: NEGATIVE
Protein, ur: 30 mg/dL — AB
Specific Gravity, Urine: 1.02 (ref 1.005–1.030)
pH: 5 (ref 5.0–8.0)

## 2023-04-30 LAB — CBC WITH DIFFERENTIAL/PLATELET
Abs Immature Granulocytes: 0.02 10*3/uL (ref 0.00–0.07)
Basophils Absolute: 0.1 10*3/uL (ref 0.0–0.1)
Basophils Relative: 1 %
Eosinophils Absolute: 0.1 10*3/uL (ref 0.0–0.5)
Eosinophils Relative: 1 %
HCT: 43.6 % (ref 39.0–52.0)
Hemoglobin: 15.5 g/dL (ref 13.0–17.0)
Immature Granulocytes: 0 %
Lymphocytes Relative: 15 %
Lymphs Abs: 1.2 10*3/uL (ref 0.7–4.0)
MCH: 35.9 pg — ABNORMAL HIGH (ref 26.0–34.0)
MCHC: 35.6 g/dL (ref 30.0–36.0)
MCV: 100.9 fL — ABNORMAL HIGH (ref 80.0–100.0)
Monocytes Absolute: 0.6 10*3/uL (ref 0.1–1.0)
Monocytes Relative: 8 %
Neutro Abs: 6 10*3/uL (ref 1.7–7.7)
Neutrophils Relative %: 75 %
Platelets: 164 10*3/uL (ref 150–400)
RBC: 4.32 MIL/uL (ref 4.22–5.81)
RDW: 12.6 % (ref 11.5–15.5)
WBC: 7.9 10*3/uL (ref 4.0–10.5)
nRBC: 0 % (ref 0.0–0.2)

## 2023-04-30 LAB — APTT: aPTT: 27 s (ref 24–36)

## 2023-04-30 LAB — PROTIME-INR
INR: 1 (ref 0.8–1.2)
Prothrombin Time: 13.3 s (ref 11.4–15.2)

## 2023-04-30 LAB — COMPREHENSIVE METABOLIC PANEL
ALT: 60 U/L — ABNORMAL HIGH (ref 0–44)
AST: 83 U/L — ABNORMAL HIGH (ref 15–41)
Albumin: 4 g/dL (ref 3.5–5.0)
Alkaline Phosphatase: 91 U/L (ref 38–126)
Anion gap: 12 (ref 5–15)
BUN: 6 mg/dL (ref 6–20)
CO2: 25 mmol/L (ref 22–32)
Calcium: 9.4 mg/dL (ref 8.9–10.3)
Chloride: 96 mmol/L — ABNORMAL LOW (ref 98–111)
Creatinine, Ser: 0.64 mg/dL (ref 0.61–1.24)
GFR, Estimated: >60 mL/min (ref >60)
Glucose, Bld: 106 mg/dL — ABNORMAL HIGH (ref 70–99)
Potassium: 3.5 mmol/L (ref 3.5–5.1)
Sodium: 133 mmol/L — ABNORMAL LOW (ref 135–145)
Total Bilirubin: 2.2 mg/dL — ABNORMAL HIGH (ref <1.2)
Total Protein: 7.5 g/dL (ref 6.5–8.1)

## 2023-04-30 LAB — ETHANOL: Alcohol, Ethyl (B): <10 mg/dL (ref <10)

## 2023-04-30 LAB — LIPASE, BLOOD: Lipase: 371 U/L — ABNORMAL HIGH (ref 11–51)

## 2023-04-30 MED ORDER — SODIUM CHLORIDE 0.9% FLUSH: 3.0000 mL | INTRAVENOUS | Status: DC | PRN

## 2023-04-30 MED ORDER — HYDRALAZINE HCL 20 MG/ML IJ SOLN: 10.0000 mg | Freq: Three times a day (TID) | INTRAMUSCULAR | Status: DC | PRN

## 2023-04-30 MED ORDER — BISACODYL 5 MG PO TBEC: 5.0000 mg | DELAYED_RELEASE_TABLET | Freq: Every day | ORAL | Status: DC | PRN

## 2023-04-30 MED ORDER — ONDANSETRON HCL 4 MG/2ML IJ SOLN
4.0000 mg | Freq: Once | INTRAMUSCULAR | Status: AC
  Administered 2023-04-30: 4 mg via INTRAVENOUS
  Filled 2023-04-30: qty 2

## 2023-04-30 MED ORDER — MORPHINE SULFATE (PF) 4 MG/ML IV SOLN
4.0000 mg | Freq: Once | INTRAVENOUS | Status: AC
Start: 2023-04-30 — End: 2023-04-30
  Administered 2023-04-30: 4 mg via INTRAVENOUS
  Filled 2023-04-30: qty 1

## 2023-04-30 MED ORDER — MORPHINE SULFATE (PF) 4 MG/ML IV SOLN
4.0000 mg | Freq: Once | INTRAVENOUS | Status: AC
  Administered 2023-04-30: 4 mg via INTRAVENOUS
  Filled 2023-04-30: qty 1

## 2023-04-30 MED ORDER — IBUPROFEN 200 MG PO TABS: 400.0000 mg | ORAL_TABLET | Freq: Four times a day (QID) | ORAL | Status: DC | PRN

## 2023-04-30 MED ORDER — LACTATED RINGERS IV BOLUS
1000.0000 mL | Freq: Once | INTRAVENOUS | Status: AC
  Administered 2023-04-30: 1000 mL via INTRAVENOUS

## 2023-04-30 MED ORDER — SODIUM CHLORIDE 0.9% FLUSH
3.0000 mL | Freq: Two times a day (BID) | INTRAVENOUS | Status: DC
  Administered 2023-05-01 – 2023-05-02 (×3): 3 mL via INTRAVENOUS

## 2023-04-30 MED ORDER — ONDANSETRON HCL 4 MG PO TABS: 4.0000 mg | ORAL_TABLET | Freq: Four times a day (QID) | ORAL | Status: DC | PRN

## 2023-04-30 MED ORDER — SODIUM CHLORIDE 0.9 % IV SOLN: 250.0000 mL | INTRAVENOUS | Status: AC | PRN

## 2023-04-30 MED ORDER — OXYCODONE-ACETAMINOPHEN 5-325 MG PO TABS
1.0000 | ORAL_TABLET | Freq: Four times a day (QID) | ORAL | Status: DC | PRN
Start: 2023-04-30 — End: 2023-05-02
  Administered 2023-05-01 (×2): 1 via ORAL
  Filled 2023-04-30 (×2): qty 1

## 2023-04-30 MED ORDER — LACTATED RINGERS IV SOLN: INTRAVENOUS | Status: DC

## 2023-04-30 MED ORDER — ENOXAPARIN SODIUM 40 MG/0.4ML IJ SOSY: 40.0000 mg | PREFILLED_SYRINGE | INTRAMUSCULAR | Status: DC

## 2023-04-30 MED ORDER — IOHEXOL 300 MG/ML  SOLN
100.0000 mL | Freq: Once | INTRAMUSCULAR | Status: AC | PRN
  Administered 2023-04-30: 100 mL via INTRAVENOUS

## 2023-04-30 MED ORDER — OXYCODONE-ACETAMINOPHEN 5-325 MG PO TABS: 1.0000 | ORAL_TABLET | Freq: Four times a day (QID) | ORAL | 0 refills | Status: DC | PRN

## 2023-04-30 MED ORDER — ONDANSETRON 4 MG PO TBDP: 4.0000 mg | ORAL_TABLET | Freq: Three times a day (TID) | ORAL | 0 refills | Status: DC | PRN

## 2023-04-30 MED ORDER — HYDROMORPHONE HCL 1 MG/ML IJ SOLN
0.5000 mg | INTRAMUSCULAR | Status: DC | PRN
  Administered 2023-04-30 – 2023-05-02 (×15): 1 mg via INTRAVENOUS
  Filled 2023-04-30 (×17): qty 1

## 2023-04-30 MED ORDER — ONDANSETRON HCL 4 MG/2ML IJ SOLN
4.0000 mg | Freq: Four times a day (QID) | INTRAMUSCULAR | Status: DC | PRN
  Administered 2023-05-01 – 2023-05-02 (×5): 4 mg via INTRAVENOUS
  Filled 2023-04-30 (×6): qty 2

## 2023-04-30 NOTE — ED Notes (Signed)
ED TO INPATIENT HANDOFF REPORT  Name/Age/Gender Jeffrey Mathis 42 y.o. male  Code Status    Code Status Orders  (From admission, onward)           Start     Ordered   04/30/23 1953  Full code  Continuous       Question:  By:  Answer:  Consent: discussion documented in EHR   04/30/23 1954           Code Status History     Date Active Date Inactive Code Status Order ID Comments User Context   06/07/2022 2205 06/09/2022 1841 Full Code [id number]  Charlsie Quest, MD ED   11/03/2021 1707 11/06/2021 1604 Full Code [id number]  Emeline General, MD ED   05/17/2021 1028 05/20/2021 1826 Full Code [id number]  Bobette Mo, MD ED   12/11/2017 2025 12/14/2017 1643 Full Code [id number]  Pearson Grippe, MD ED       Home/SNF/Other Home  Chief Complaint Acute pancreatitis [K85.90]  Level of Care/Admitting Diagnosis ED Disposition     ED Disposition  Admit   Condition  --   Comment  Hospital Area: [facility] [[id number]]  Level of Care: Telemetry [5]  Admit to tele based on following criteria: Other see comments  Comments: History of bradycardia  May place patient in observation at [facility] or [facility] if equivalent level of care is available:: No  Covid Evaluation: Asymptomatic - no recent exposure (last 10 days) testing not required  Diagnosis: Acute pancreatitis [577.0.ICD-9-CM]  Admitting Physician: Tereasa Coop [[id number]]  Attending Physician: Tereasa Coop [[id number]]          Medical History Past Medical History:  Diagnosis Date   Pancreatitis    PTSD (post-traumatic stress disorder)    PTSD (post-traumatic stress disorder)     Allergies No Known Allergies  IV Location/Drains/Wounds Patient Lines/Drains/Airways Status     Active Line/Drains/Airways     Name Placement date Placement time Site Days   Peripheral IV 04/30/23 20 G 1" Anterior;Distal;Left;Upper Arm 04/30/23  2010  Arm  less than 1            Labs/Imaging Results  for orders placed or performed during the hospital encounter of 04/30/23 (from the past 48 hour(s))  Comprehensive metabolic panel     Status: Abnormal   Collection Time: 04/30/23  8:43 AM  Result Value Ref Range   Sodium 133 (L) 135 - 145 mmol/L   Potassium 3.5 3.5 - 5.1 mmol/L   Chloride 96 (L) 98 - 111 mmol/L   CO2 25 22 - 32 mmol/L   Glucose, Bld 106 (H) 70 - 99 mg/dL    Comment: Glucose reference range applies only to samples taken after fasting for at least 8 hours.   BUN 6 6 - 20 mg/dL   Creatinine, Ser 0.27 0.61 - 1.24 mg/dL   Calcium 9.4 8.9 - 25.3 mg/dL   Total Protein 7.5 6.5 - 8.1 g/dL   Albumin 4.0 3.5 - 5.0 g/dL   AST 83 (H) 15 - 41 U/L   ALT 60 (H) 0 - 44 U/L   Alkaline Phosphatase 91 38 - 126 U/L   Total Bilirubin 2.2 (H) <1.2 mg/dL   GFR, Estimated >66 >44 mL/min    Comment: (NOTE) Calculated using the CKD-EPI Creatinine Equation (2021)    Anion gap 12 5 - 15    Comment: Performed at [facility], [address] [place], [place] [address]  Lipase,  blood     Status: Abnormal   Collection Time: 04/30/23  8:43 AM  Result Value Ref Range   Lipase 371 (H) 11 - 51 U/L    Comment: Performed at [facility], [address] [place], [place] [address]  CBC with Diff     Status: Abnormal   Collection Time: 04/30/23  8:43 AM  Result Value Ref Range   WBC 7.9 4.0 - 10.5 K/uL   RBC 4.32 4.22 - 5.81 MIL/uL   Hemoglobin 15.5 13.0 - 17.0 g/dL   HCT 62.1 30.8 - 65.7 %   MCV 100.9 (H) 80.0 - 100.0 fL   MCH 35.9 (H) 26.0 - 34.0 pg   MCHC 35.6 30.0 - 36.0 g/dL   RDW 84.6 96.2 - 95.2 %   Platelets 164 150 - 400 K/uL   nRBC 0.0 0.0 - 0.2 %   Neutrophils Relative % 75 %   Neutro Abs 6.0 1.7 - 7.7 K/uL   Lymphocytes Relative 15 %   Lymphs Abs 1.2 0.7 - 4.0 K/uL   Monocytes Relative 8 %   Monocytes Absolute 0.6 0.1 - 1.0 K/uL   Eosinophils Relative 1 %   Eosinophils Absolute 0.1 0.0 - 0.5 K/uL   Basophils Relative 1 %   Basophils  Absolute 0.1 0.0 - 0.1 K/uL   Immature Granulocytes 0 %   Abs Immature Granulocytes 0.02 0.00 - 0.07 K/uL    Comment: Performed at [facility], [address] [place], [place] [address]  Urinalysis, Routine w reflex microscopic -Urine, Clean Catch     Status: Abnormal   Collection Time: 04/30/23  9:10 AM  Result Value Ref Range   Color, Urine AMBER (A) YELLOW    Comment: BIOCHEMICALS MAY BE AFFECTED BY COLOR   APPearance CLEAR CLEAR   Specific Gravity, Urine 1.020 1.005 - 1.030   pH 5.0 5.0 - 8.0   Glucose, UA NEGATIVE NEGATIVE mg/dL   Hgb urine dipstick NEGATIVE NEGATIVE   Bilirubin Urine SMALL (A) NEGATIVE   Ketones, ur 20 (A) NEGATIVE mg/dL   Protein, ur 30 (A) NEGATIVE mg/dL   Nitrite NEGATIVE NEGATIVE   Leukocytes,Ua NEGATIVE NEGATIVE   RBC / HPF 0-5 0 - 5 RBC/hpf   WBC, UA 0-5 0 - 5 WBC/hpf   Bacteria, UA NONE SEEN NONE SEEN   Squamous Epithelial / HPF 0-5 0 - 5 /HPF   Mucus PRESENT     Comment: Performed at [facility], [address] [place], [place] [address]   CT ABDOMEN PELVIS W CONTRAST  Result Date: 04/30/2023 CLINICAL DATA:  Abdominal pain radiating to back for 2 days, severe acute pancreatitis EXAM: CT ABDOMEN AND PELVIS WITH CONTRAST TECHNIQUE: Multidetector CT imaging of the abdomen and pelvis was performed using the standard protocol following bolus administration of intravenous contrast. RADIATION DOSE REDUCTION: This exam was performed according to the departmental dose-optimization program which includes automated exposure control, adjustment of the mA and/or kV according to patient size and/or use of iterative reconstruction technique. CONTRAST:  OMNIPAQUE IOHEXOL 300 MG/ML  SOLN COMPARISON:  06/07/2022 FINDINGS: Lower chest: No acute pleural or parenchymal lung disease. Hepatobiliary: Diffuse hepatic steatosis. No focal liver abnormality. The gallbladder is unremarkable. Pancreas: Mild peripancreatic edema involving  the head and body of the pancreas. No fluid collection, pseudocyst, or abscess. No pancreatic duct dilation. Spleen: Normal in size without focal abnormality. Adrenals/Urinary Tract: Adrenal glands are unremarkable. Kidneys are normal, without renal calculi, focal lesion, or  hydronephrosis. Bladder is decompressed, which limits its evaluation. Stomach/Bowel: No bowel obstruction or ileus. Mild wall thickening of the cecum and proximal ascending colon with trace pericolonic fat stranding. Please correlate for signs or symptoms of inflammatory or infectious colitis. Vascular/Lymphatic: There are small upper abdominal varices, greatest around the gastroesophageal junction and distal esophagus. Findings may reflect underlying portal hypertension. The splenic vein, SMV, and portal vein are patent. The portal vein is normal in caliber. No other significant vascular findings. No pathologic adenopathy. Reproductive: Prostate is unremarkable. Other: No free fluid or free intraperitoneal gas. No abdominal wall hernia. Musculoskeletal: No acute or destructive bony abnormalities. Reconstructed images demonstrate no additional findings. IMPRESSION: 1. Acute uncomplicated pancreatitis, with mild edema surrounding the head and body of the pancreas. No fluid collection, pseudocyst, or abscess. 2. Hepatic steatosis. 3. Nonspecific wall thickening and pericolonic fat stranding involving the cecum and ascending colon. While this could reflect inflammatory or infectious colitis, portal colopathy could be considered given the additional findings suggesting portal venous hypertension with upper abdominal varices surrounding the stomach and esophagus. Electronically Signed   By: Sharlet Salina M.D.   On: 04/30/2023 10:06    Pending Labs Unresulted Labs (From admission, onward)     Start     Ordered   05/01/23 0500  Comprehensive metabolic panel  Tomorrow morning,   R        04/30/23 1954   05/01/23 0500  CBC  Tomorrow morning,   R         04/30/23 1954   04/30/23 2011  Hepatitis panel, acute  Add-on,   AD        04/30/23 2010   04/30/23 1954  Protime-INR  Add-on,   AD        04/30/23 1954   04/30/23 1954  APTT  Add-on,   AD        04/30/23 1954   04/30/23 1950  Ethanol  Add-on,   AD        04/30/23 1950            Vitals/Pain Today's Vitals   04/30/23 1832 04/30/23 1836 04/30/23 1940  BP: (!) 156/88    Pulse: 87    Resp: 18    Temp: 98.6 F (37 C)    TempSrc: Oral    SpO2: 99%    PainSc:  7  10-Worst pain ever    Isolation Precautions No active isolations  Medications Medications  oxyCODONE-acetaminophen (PERCOCET/ROXICET) 5-325 MG per tablet 1 tablet (has no administration in time range)  sodium chloride flush (NS) 0.9 % injection 3 mL (has no administration in time range)  lactated ringers infusion (has no administration in time range)  sodium chloride flush (NS) 0.9 % injection 3 mL (has no administration in time range)  sodium chloride flush (NS) 0.9 % injection 3 mL (has no administration in time range)  0.9 %  sodium chloride infusion (has no administration in time range)  ibuprofen (ADVIL) tablet 400 mg (has no administration in time range)  HYDROmorphone (DILAUDID) injection 0.5-1 mg (has no administration in time range)  bisacodyl (DULCOLAX) EC tablet 5 mg (has no administration in time range)  ondansetron (ZOFRAN) tablet 4 mg (has no administration in time range)    Or  ondansetron (ZOFRAN) injection 4 mg (has no administration in time range)  hydrALAZINE (APRESOLINE) injection 10 mg (has no administration in time range)  lactated ringers bolus 1,000 mL (1,000 mLs Intravenous Bolus 04/30/23 2012)  morphine (PF) 4 MG/ML injection  4 mg (4 mg Intravenous Given 04/30/23 2011)  ondansetron (ZOFRAN) injection 4 mg (4 mg Intravenous Given 04/30/23 2011)    Mobility walks

## 2023-04-30 NOTE — ED Triage Notes (Signed)
Pt arrived reporting he was here earlier with pancreatitis. Reports he was given tylenol earlier for pain and his pain got 10x worse. States has been throwing up since, last episode PTA.

## 2023-04-30 NOTE — ED Provider Notes (Signed)
[place] EMERGENCY DEPARTMENT AT [facility] Provider Note  CSN: [id number] Arrival date & time: 04/30/23 1828  Chief Complaint(s) Abdominal Pain and Emesis  HPI Jeffrey Mathis is a 42 y.o. male with PMH alcohol use with alcoholic pancreatitis who presents emergency room for evaluation of abdominal pain nausea and vomiting.  Patient seen earlier this morning with full emergency department workup confirming acute pancreatitis.  His pain was under control at time of discharge and he wanted to try and control his symptoms at home with oral medications.  Patient was reportedly told he was not allowed to consume any Tylenol and thus did not take his Percocet that was prescribed and had persistent abdominal pain nausea and vomiting.  He [name] presents emergency department for admission in the setting of known pancreatitis.   Past Medical History Past Medical History:  Diagnosis Date   Pancreatitis    PTSD (post-traumatic stress disorder)    PTSD (post-traumatic stress disorder)    Patient Active Problem List   Diagnosis Date Noted   Alcohol induced acute pancreatitis without necrosis or infection 06/07/2022   Alcohol use disorder, severe, dependence (HCC) 06/07/2022   Pancreatitis 11/03/2021   Sinus bradycardia 05/17/2021   Tobacco use 12/15/2020   Encounter for general adult medical examination with abnormal findings 12/15/2020   Leukocytosis 12/12/2017   Hepatic steatosis 12/12/2017   Home Medication(s) Prior to Admission medications   Medication Sig Start Date End Date Taking? Authorizing Provider  ondansetron (ZOFRAN-ODT) 4 MG disintegrating tablet Take 1 tablet (4 mg total) by mouth every 8 (eight) hours as needed for nausea or vomiting. 04/30/23   Smoot, Shawn Route, PA-C  oxyCODONE-acetaminophen (PERCOCET/ROXICET) 5-325 MG tablet Take 1 tablet by mouth every 6 (six) hours as needed for severe pain (pain score 7-10). 04/30/23   Smoot, Shawn Route, PA-C  pantoprazole  (PROTONIX) 40 MG tablet Take 1 tablet (40 mg total) by mouth daily. Patient not taking: Reported on 04/30/2023 11/06/21 12/06/21  Glade Lloyd, MD                                                                                                                                    Past Surgical History Past Surgical History:  Procedure Laterality Date   ABDOMINAL SURGERY     scrapnel   APPENDECTOMY     arm surgery     KNEE ARTHROSCOPY     R forearm surgery     Family History Family History  Problem Relation Age of Onset   Cancer Mother    Bipolar disorder Mother    Cancer Father    Pancreatitis Father     Social History Social History   Tobacco Use   Smoking status: Every Day    Current packs/day: 0.15    Types: Cigarettes   Smokeless tobacco: Never  Vaping Use   Vaping status: Never Used  Substance Use Topics   Alcohol use: Not Currently  Drug use: Yes    Comment: CBD use also   Allergies Patient has no known allergies.  Review of Systems Review of Systems  Gastrointestinal:  Positive for abdominal pain.    Physical Exam Vital Signs  I have reviewed the triage vital signs BP (!) 156/88 (BP Location: Right Arm)   Pulse 87   Temp 98.6 F (37 C) (Oral)   Resp 18   SpO2 99%   Physical Exam Constitutional:      General: He is not in acute distress.    Appearance: Normal appearance.  HENT:     Head: Normocephalic and atraumatic.     Nose: No congestion or rhinorrhea.  Eyes:     General:        Right eye: No discharge.        Left eye: No discharge.     Extraocular Movements: Extraocular movements intact.     Pupils: Pupils are equal, round, and reactive to light.  Cardiovascular:     Rate and Rhythm: Normal rate and regular rhythm.     Heart sounds: No murmur heard. Pulmonary:     Effort: No respiratory distress.     Breath sounds: No wheezing or rales.  Abdominal:     General: There is no distension.     Tenderness: There is abdominal tenderness in  the epigastric area.  Musculoskeletal:        General: Normal range of motion.     Cervical back: Normal range of motion.  Skin:    General: Skin is warm and dry.  Neurological:     General: No focal deficit present.     Mental Status: He is alert.     ED Results and Treatments Labs (all labs ordered are listed, but only abnormal results are displayed) Labs Reviewed - No data to display                                                                                                                        Radiology CT ABDOMEN PELVIS W CONTRAST  Result Date: 04/30/2023 CLINICAL DATA:  Abdominal pain radiating to back for 2 days, severe acute pancreatitis EXAM: CT ABDOMEN AND PELVIS WITH CONTRAST TECHNIQUE: Multidetector CT imaging of the abdomen and pelvis was performed using the standard protocol following bolus administration of intravenous contrast. RADIATION DOSE REDUCTION: This exam was performed according to the departmental dose-optimization program which includes automated exposure control, adjustment of the mA and/or kV according to patient size and/or use of iterative reconstruction technique. CONTRAST:  OMNIPAQUE IOHEXOL 300 MG/ML  SOLN COMPARISON:  06/07/2022 FINDINGS: Lower chest: No acute pleural or parenchymal lung disease. Hepatobiliary: Diffuse hepatic steatosis. No focal liver abnormality. The gallbladder is unremarkable. Pancreas: Mild peripancreatic edema involving the head and body of the pancreas. No fluid collection, pseudocyst, or abscess. No pancreatic duct dilation. Spleen: Normal in size without focal abnormality. Adrenals/Urinary Tract: Adrenal glands are unremarkable. Kidneys are normal, without renal calculi, focal lesion, or hydronephrosis.  Bladder is decompressed, which limits its evaluation. Stomach/Bowel: No bowel obstruction or ileus. Mild wall thickening of the cecum and proximal ascending colon with trace pericolonic fat stranding. Please correlate for  signs or symptoms of inflammatory or infectious colitis. Vascular/Lymphatic: There are small upper abdominal varices, greatest around the gastroesophageal junction and distal esophagus. Findings may reflect underlying portal hypertension. The splenic vein, SMV, and portal vein are patent. The portal vein is normal in caliber. No other significant vascular findings. No pathologic adenopathy. Reproductive: Prostate is unremarkable. Other: No free fluid or free intraperitoneal gas. No abdominal wall hernia. Musculoskeletal: No acute or destructive bony abnormalities. Reconstructed images demonstrate no additional findings. IMPRESSION: 1. Acute uncomplicated pancreatitis, with mild edema surrounding the head and body of the pancreas. No fluid collection, pseudocyst, or abscess. 2. Hepatic steatosis. 3. Nonspecific wall thickening and pericolonic fat stranding involving the cecum and ascending colon. While this could reflect inflammatory or infectious colitis, portal colopathy could be considered given the additional findings suggesting portal venous hypertension with upper abdominal varices surrounding the stomach and esophagus. Electronically Signed   By: Sharlet Salina M.D.   On: 04/30/2023 10:06    Pertinent labs & imaging results that were available during my care of the patient were reviewed by me and considered in my medical decision making (see MDM for details).  Medications Ordered in ED Medications  lactated ringers bolus 1,000 mL (has no administration in time range)  morphine (PF) 4 MG/ML injection 4 mg (has no administration in time range)  ondansetron (ZOFRAN) injection 4 mg (has no administration in time range)                                                                                                                                     Procedures Procedures  (including critical care time)  Medical Decision Making / ED Course   This patient presents to the ED for concern of abdominal  pain, this involves an extensive number of treatment options, and is a complaint that carries with it a high risk of complications and morbidity.  The differential diagnosis includes GERD/gastritis, peptic ulcer disease, pancreatitis, gastroparesis, pneumonia, pleurisy, pericarditis  MDM: Patient seen emergency room for evaluation of abdominal pain nausea and vomiting.  Physical exam with epigastric tenderness to palpation.  Review of ER workup this morning confirming acute pancreatitis.  Patient failed outpatient pain regimen and require hospital admission for persistent pain and vomiting in the setting of known pancreatitis   Additional history obtained: -Additional history obtained from spouse -External records from outside source obtained and reviewed including: Chart review including previous notes, labs, imaging, consultation notes   Medicines ordered and prescription drug management: Meds ordered this encounter  Medications   lactated ringers bolus 1,000 mL   morphine (PF) 4 MG/ML injection 4 mg   ondansetron (ZOFRAN) injection 4 mg    -I have reviewed the patients home medicines and have  made adjustments as needed  Critical interventions none    Cardiac Monitoring: The patient was maintained on a cardiac monitor.  I personally viewed and interpreted the cardiac monitored which showed an underlying rhythm of: NSR  Social Determinants of Health:  Factors impacting patients care include: Has not consumed alcohol since January   Reevaluation: After the interventions noted above, I reevaluated the patient and found that they have :improved  Co morbidities that complicate the patient evaluation  Past Medical History:  Diagnosis Date   Pancreatitis    PTSD (post-traumatic stress disorder)    PTSD (post-traumatic stress disorder)       Dispostion: I considered admission for this patient, and patient require hospital mission for pancreatitis     Final Clinical  Impression(s) / ED Diagnoses Final diagnoses:  Acute pancreatitis, unspecified complication status, unspecified pancreatitis type     @PCDICTATION @    Glendora Score, MD 04/30/23 [id number]

## 2023-04-30 NOTE — Discharge Instructions (Addendum)
As we discussed, your workup in the ER today revealed that your pain is due to a flare of your pancreatitis.  I did offer you admission for management of this, however you would prefer to manage her symptoms at home which is reasonable.  I have given you a prescription for Percocet which is a narcotic pain medication for you to take as prescribed as needed for severe pain only.  Do not drive or operate heavy machinery while taking this medication as it can be sedating.  Additionally, I have given you a prescription for Zofran to take as needed for nausea and vomiting.  Please adhere to a liquid diet in the short-term and then slowly escalate to soft and then solid foods.  Please avoid foods high in protein and fat as these are foods that will make your stomach hurt worse.  I have attached further information regarding this diagnosis and management plan to your paperwork.  Please reach out to your primary doctor and your GI doctor for close follow-up.  Return if development of any new or worsening symptoms.

## 2023-04-30 NOTE — ED Triage Notes (Signed)
Pt arrived POV with c/o abdominal pain (burning sensation across abdomen and back) for the pass 2 days.

## 2023-04-30 NOTE — ED Notes (Signed)
Pt provided with a cup of ice chips per approval of Kommor,MD.

## 2023-04-30 NOTE — H&P (Addendum)
History and Physical    Jeffrey Mathis AVW:[id number] DOB: May 28, 1981 DOA: 04/30/2023  PCP: [facility]   Patient coming from: Home   Chief Complaint:  Chief Complaint  Patient presents with   Abdominal Pain   Emesis   ED TRIAGE note: Pt arrived POV with c/o abdominal pain (burning sensation across abdomen and back) for the pass 2 days.             HPI:  Jeffrey Mathis is a 42 y.o. male with medical history significant of right-sided arm fracture status post titanium plate placement, chronic alcohol use disorder, alcohol associated pancreatitis, sinus bradycardia, PTSD, depression, anxiety, chronic smoking present emergency department for evaluation for abdominal pain for persistent for 2 days.  Patient reported pain consistent and feels like flare of acute pancreatitis that he experienced before.  Patient noted upper abdominal pain across the whole abdomen which is constant in nature, 8 out of 10 intensity with associated nausea and 2 episodes of vomiting.  Denies any diarrhea.  Reported he usually has constipation. He is going to see GI doctor in February 2025 for his first appointment. When I asked patient about drinking alcohol he was thinking for a while and then said no which is I am not completely relying. Denies any diarrhea, fever and chill.  Denies any chest pain, cough, shortness of breath, palpitation and headache.  No previous history of hypertension, diabetes, MI and stroke.  Denies any previous alcohol withdrawal related seizure hallucination.  Patient presented to earlier today in the emergency department for abdominal pain.  During that time workup showed acute pancreatitis.  Pain improved with IV and oral morphine and patient was discharged home with oral Percocet.  However as patient's pain did not improve much came to ED for further evaluation.  ED Course:  At presentation to ED blood pressure elevated 150/88 otherwise hemodynamically stable. CBC  showing mild hyponatremia 133 and evidence of chronic transaminitis and elevated bilirubin 2.2. Elevated lipase 371. CBC unremarkable except macrocytic anemia stable hemoglobin 15.5. UA amber appearance, bilirubin positive, ketone 30 and protein 30. CT abdomen pelvis: head and body of the pancreas. No fluid collection, pseudocyst, or abscess. 2. Hepatic steatosis. 3. Nonspecific wall thickening and pericolonic fat stranding involving the cecum and ascending colon. While this could reflect inflammatory or infectious colitis, portal colopathy could be considered given the additional findings suggesting portal venous hypertension with upper abdominal varices surrounding the stomach and esophagus.  In the ED patient has been treated with morphine 4 mg IV, Zofran 4 mg and 1 L of LR bolus.  Hospitalist has been contacted for further evaluation management of pancreatitis.   Significant labs in the ED: Lab Orders         Ethanol         Comprehensive metabolic panel         CBC         Protime-INR         APTT         Hepatitis panel, acute      Review of Systems:  Review of Systems  Constitutional:  Negative for chills, fever, malaise/fatigue and weight loss.  Respiratory:  Negative for cough and sputum production.   Cardiovascular:  Negative for chest pain.  Gastrointestinal:  Positive for abdominal pain, nausea and vomiting. Negative for blood in stool, constipation, diarrhea, heartburn and melena.  Genitourinary:  Negative for dysuria, flank pain, frequency, hematuria and urgency.  Musculoskeletal:  Negative for back pain, falls,  joint pain, myalgias and neck pain.  Neurological:  Negative for dizziness and headaches.  Endo/Heme/Allergies:  Does not bruise/bleed easily.  Psychiatric/Behavioral:  The patient is not nervous/anxious.     Past Medical History:  Diagnosis Date   Pancreatitis    PTSD (post-traumatic stress disorder)    PTSD (post-traumatic stress disorder)      Past Surgical History:  Procedure Laterality Date   ABDOMINAL SURGERY     scrapnel   APPENDECTOMY     arm surgery     KNEE ARTHROSCOPY     R forearm surgery       reports that he has been smoking cigarettes. He has never used smokeless tobacco. He reports that he does not currently use alcohol. He reports current drug use.  No Known Allergies  Family History  Problem Relation Age of Onset   Cancer Mother    Bipolar disorder Mother    Cancer Father    Pancreatitis Father     Prior to Admission medications   Medication Sig Start Date End Date Taking? Authorizing Provider  ondansetron (ZOFRAN-ODT) 4 MG disintegrating tablet Take 1 tablet (4 mg total) by mouth every 8 (eight) hours as needed for nausea or vomiting. 04/30/23   Smoot, Shawn Route, PA-C  oxyCODONE-acetaminophen (PERCOCET/ROXICET) 5-325 MG tablet Take 1 tablet by mouth every 6 (six) hours as needed for severe pain (pain score 7-10). 04/30/23   Smoot, Shawn Route, PA-C  pantoprazole (PROTONIX) 40 MG tablet Take 1 tablet (40 mg total) by mouth daily. Patient not taking: Reported on 04/30/2023 11/06/21 12/06/21  Glade Lloyd, MD     Physical Exam: Vitals:   04/30/23 1832  BP: (!) 156/88  Pulse: 87  Resp: 18  Temp: 98.6 F (37 C)  TempSrc: Oral  SpO2: 99%    Physical Exam Vitals and nursing note reviewed.  Constitutional:      Appearance: He is not ill-appearing.  Cardiovascular:     Rate and Rhythm: Normal rate and regular rhythm.  Pulmonary:     Effort: Pulmonary effort is normal.     Breath sounds: Normal breath sounds.  Abdominal:     General: Bowel sounds are normal.     Palpations: Abdomen is soft. There is no hepatomegaly, splenomegaly or mass.     Tenderness: There is abdominal tenderness in the epigastric area. There is no guarding or rebound.  Skin:    General: Skin is dry.     Capillary Refill: Capillary refill takes less than 2 seconds.  Neurological:     Mental Status: He is alert and oriented  to person, place, and time.  Psychiatric:        Mood and Affect: Mood is anxious. Mood is not depressed.        Behavior: Behavior normal.      Labs on Admission: I have personally reviewed following labs and imaging studies  CBC: Recent Labs  Lab 04/30/23 0843  WBC 7.9  NEUTROABS 6.0  HGB 15.5  HCT 43.6  MCV 100.9*  PLT 164   Basic Metabolic Panel: Recent Labs  Lab 04/30/23 0843  NA 133*  K 3.5  CL 96*  CO2 25  GLUCOSE 106*  BUN 6  CREATININE 0.64  CALCIUM 9.4   GFR: Estimated Creatinine Clearance: 126 mL/min (by C-G formula based on SCr of 0.64 mg/dL). Liver Function Tests: Recent Labs  Lab 04/30/23 0843  AST 83*  ALT 60*  ALKPHOS 91  BILITOT 2.2*  PROT 7.5  ALBUMIN 4.0  Recent Labs  Lab 04/30/23 0843  LIPASE 371*   No results for input(s): "AMMONIA" in the last 168 hours. Coagulation Profile: No results for input(s): "INR", "PROTIME" in the last 168 hours. Cardiac Enzymes: No results for input(s): "CKTOTAL", "CKMB", "CKMBINDEX", "TROPONINI", "TROPONINIHS" in the last 168 hours. BNP (last 3 results) No results for input(s): "BNP" in the last 8760 hours. HbA1C: No results for input(s): "HGBA1C" in the last 72 hours. CBG: No results for input(s): "GLUCAP" in the last 168 hours. Lipid Profile: No results for input(s): "CHOL", "HDL", "LDLCALC", "TRIG", "CHOLHDL", "LDLDIRECT" in the last 72 hours. Thyroid Function Tests: No results for input(s): "TSH", "T4TOTAL", "FREET4", "T3FREE", "THYROIDAB" in the last 72 hours. Anemia Panel: No results for input(s): "VITAMINB12", "FOLATE", "FERRITIN", "TIBC", "IRON", "RETICCTPCT" in the last 72 hours. Urine analysis:    Component Value Date/Time   COLORURINE AMBER (A) 04/30/2023 0910   APPEARANCEUR CLEAR 04/30/2023 0910   LABSPEC 1.020 04/30/2023 0910   PHURINE 5.0 04/30/2023 0910   GLUCOSEU NEGATIVE 04/30/2023 0910   HGBUR NEGATIVE 04/30/2023 0910   BILIRUBINUR SMALL (A) 04/30/2023 0910   KETONESUR 20  (A) 04/30/2023 0910   PROTEINUR 30 (A) 04/30/2023 0910   NITRITE NEGATIVE 04/30/2023 0910   LEUKOCYTESUR NEGATIVE 04/30/2023 0910    Radiological Exams on Admission: I have personally reviewed images CT ABDOMEN PELVIS W CONTRAST  Result Date: 04/30/2023 CLINICAL DATA:  Abdominal pain radiating to back for 2 days, severe acute pancreatitis EXAM: CT ABDOMEN AND PELVIS WITH CONTRAST TECHNIQUE: Multidetector CT imaging of the abdomen and pelvis was performed using the standard protocol following bolus administration of intravenous contrast. RADIATION DOSE REDUCTION: This exam was performed according to the departmental dose-optimization program which includes automated exposure control, adjustment of the mA and/or kV according to patient size and/or use of iterative reconstruction technique. CONTRAST:  OMNIPAQUE IOHEXOL 300 MG/ML  SOLN COMPARISON:  06/07/2022 FINDINGS: Lower chest: No acute pleural or parenchymal lung disease. Hepatobiliary: Diffuse hepatic steatosis. No focal liver abnormality. The gallbladder is unremarkable. Pancreas: Mild peripancreatic edema involving the head and body of the pancreas. No fluid collection, pseudocyst, or abscess. No pancreatic duct dilation. Spleen: Normal in size without focal abnormality. Adrenals/Urinary Tract: Adrenal glands are unremarkable. Kidneys are normal, without renal calculi, focal lesion, or hydronephrosis. Bladder is decompressed, which limits its evaluation. Stomach/Bowel: No bowel obstruction or ileus. Mild wall thickening of the cecum and proximal ascending colon with trace pericolonic fat stranding. Please correlate for signs or symptoms of inflammatory or infectious colitis. Vascular/Lymphatic: There are small upper abdominal varices, greatest around the gastroesophageal junction and distal esophagus. Findings may reflect underlying portal hypertension. The splenic vein, SMV, and portal vein are patent. The portal vein is normal in caliber. No  other significant vascular findings. No pathologic adenopathy. Reproductive: Prostate is unremarkable. Other: No free fluid or free intraperitoneal gas. No abdominal wall hernia. Musculoskeletal: No acute or destructive bony abnormalities. Reconstructed images demonstrate no additional findings. IMPRESSION: 1. Acute uncomplicated pancreatitis, with mild edema surrounding the head and body of the pancreas. No fluid collection, pseudocyst, or abscess. 2. Hepatic steatosis. 3. Nonspecific wall thickening and pericolonic fat stranding involving the cecum and ascending colon. While this could reflect inflammatory or infectious colitis, portal colopathy could be considered given the additional findings suggesting portal venous hypertension with upper abdominal varices surrounding the stomach and esophagus. Electronically Signed   By: Sharlet Salina M.D.   On: 04/30/2023 10:06     Assessment/Plan: Principal Problem:   Acute pancreatitis  Active Problems:   Chronic alcoholic pancreatitis (HCC)   Hepatic steatosis   History of chronic alcohol chronic alcohol use   Transaminitis   Hyperbilirubinemia   Esophageal varices (HCC)   Portal hypertension (HCC)    Assessment and Plan: Acute pancreatitis History of chronic alcohol induced pancreatitis -Patient presenting with upper abdominal constant pain for last 2 days with associated nausea, 2 soft episodes of vomiting. - On presentation to ED patient found borderline hypertensive otherwise unremarkable - CBC evidence of macrocytic anemia - CMP showing chronic transaminitis AST 83, ALT is 6 60.  And elevated bilirubin 2.2. -Elevated lipase 371. - CT pelvis showed  Acute uncomplicated pancreatitis, with mild edema surrounding the head and body of the pancreas. No fluid collection, pseudocyst, or abscess.. Hepatic steatosis. -Patient initially hesitant to answer about alcohol use then he said he does not drink since January 2024.  I cannot completely rely on  the alcohol abstinence history.  So checking blood alcohol level. -Transaminitis secondary to alcohol induced chronic hepatic steatosis.  Nonspecific wall thickening and pericolonic fat stranding involving the cecum and ascending colon. While this could reflect inflammatory or infectious colitis, portal colopathy could be considered given the additional findings suggesting portal venous hypertension with upper abdominal varices surrounding the stomach and esophagus. -Given patient does not have any diarrhea, fever and chill and abdominal pain secondary to acute pancreatitis not pursuing further workup for any infectious process at this time. - In the ED patient received 1 L of LR and morphine 4 mg. - Continue LR 125 cc/h for 1 day - Continue ibuprofen, oxycodone as needed for moderate pain and fentanyl as needed for severe pain. - Starting clear liquid diet and will advance diet as patient tolerates based on abdominal pain and nausea. -Continue Zofran as needed    Transaminitis Hyperbilirubinemia Hepatic steatosis Vaginal stomach varices Portal colopathy Portal hypertension -History of chronic alcohol use and hospital admission for acute pancreatitis regarding chronic alcohol related almost 5 times over the course of last year. -Patient denying alcohol use in general 2024 but not completely reliable. -CMP showing transaminitis with a bilirubin 2.2.  AST 83, ALT 60.  Alkaline phos at 91.  Checking pro time INR. - CT abdomen pelvis showed hepatic steatosis and concern for portal colopathy suggesting portal venous hypertension with upper abdominal varices surrounding the stomach and esophagus. - Patient reported nausea and vomiting.  Denies any blood in the vomiting. - Deferring any pharmacological anticoagulation in the setting of esophageal and stomach varices. -Patient reported he has establish care with GI outpatient and future appointment in February 2025.  Per chart review unable to  find out any record of GI and future appointment as well. - On discharge patient will need to refer to a GI doctor to establish care.   Mild hyponatremia - Serum sodium 133.  Sodium in between 1 36-1 37. -Concern for hyponatremia secondary to low solute intake and beer potomania. - Continue to monitor.  History of chronic alcohol use -Currently patient is hesitant about telling the alcohol use than he said he does not drink since January 2024.  Given unreliable historian checking blood alcohol level.  If blood alcohol level is high need to  start CIWA protocol.  Elevated blood pressure -Denies any history of essential hypertension. - April blood pressure 156/88.  Likely secondary to in setting of abdominal pain. - Currently controlling pain with IV as needed medication.  Even with pain control blood pressure remains treating with IV hydralazine as  needed.   DVT prophylaxis:  SCDs.  Deferring pharmacological prophylaxis in the setting of esophageal and stomach varices. Code Status:  Full Code Diet:  Family Communication:   Family was present at bedside, at the time of interview.  Opportunity was given to ask question and all questions were answered satisfactorily.  Disposition Plan: Pending improvement of abdominal pain.  Tentative discharge to home next 1 to 2 days. Consults: None at this time Admission status:   Observation, Telemetry bed  Severity of Illness: The appropriate patient status for this patient is OBSERVATION. Observation status is judged to be reasonable and necessary in order to provide the required intensity of service to ensure the patient's safety. The patient's presenting symptoms, physical exam findings, and initial radiographic and laboratory data in the context of their medical condition is felt to place them at decreased risk for further clinical deterioration. Furthermore, it is anticipated that the patient will be medically stable for discharge from the hospital  within 2 midnights of admission.     Tereasa Coop, MD [facility]  How to contact the [facility] Attending or Consulting provider 7A - 7P or covering provider during after hours 7P -7A, for this patient.  Check the care team in [facility] and look for a) attending/consulting [facility] provider listed and b) the [facility] team listed Log into [email or web address] and use [facility]'s universal password to access. If you do not have the password, please contact the hospital operator. Locate the [facility] provider you are looking for under [facility] and page to a number that you can be directly reached. If you still have difficulty reaching the provider, please page the [facility] (Director on Call) for the Hospitalists listed on amion for assistance.  04/30/2023, 8:22 PM

## 2023-04-30 NOTE — ED Provider Notes (Signed)
[place] EMERGENCY DEPARTMENT AT [facility] Provider Note   CSN: [id number] Arrival date & time: 04/30/23  4401     History  Chief Complaint  Patient presents with   Abdominal Pain    Burning sensation across abdomen and back    Jeffrey Mathis is a 42 y.o. male.  Patient with history of alcohol induced pancreatitis presents today with complaints of epigastric abdominal pain.  He states that same began 2 days ago and has been persistent since then.  Pain consistent with his previous flares of pancreatitis.  States that his flares used to be attributed to his alcohol use, however he states that he has not consumed alcohol since January of this year.  He is being followed closely by GI for his pancreatitis, and states that he is normally able to control his flares with over-the-counter pain medication.  States that this time he is not able to.  He notes some nausea and vomiting yesterday but none today.  No diarrhea.  He is having regular bowel movements.  No fevers or chills.  The history is provided by the patient. No language interpreter was used.  Abdominal Pain      Home Medications Prior to Admission medications   Medication Sig Start Date End Date Taking? Authorizing Provider  pantoprazole (PROTONIX) 40 MG tablet Take 1 tablet (40 mg total) by mouth daily. Patient not taking: Reported on 04/30/2023 11/06/21 12/06/21  Glade Lloyd, MD      Allergies    Patient has no known allergies.    Review of Systems   Review of Systems  Gastrointestinal:  Positive for abdominal pain.  All other systems reviewed and are negative.   Physical Exam Updated Vital Signs BP (!) 130/93   Pulse 64   Temp 97.6 F (36.4 C) (Oral)   Resp 18   Ht 6\' 1"  (1.854 m)   Wt 72.6 kg   SpO2 100%   BMI 21.11 kg/m  Physical Exam Vitals and nursing note reviewed.  Constitutional:      General: He is not in acute distress.    Appearance: Normal appearance. He is normal weight. He  is not ill-appearing, toxic-appearing or diaphoretic.  HENT:     Head: Normocephalic and atraumatic.  Cardiovascular:     Rate and Rhythm: Normal rate.  Pulmonary:     Effort: Pulmonary effort is normal. No respiratory distress.  Abdominal:     General: Abdomen is flat.     Palpations: Abdomen is soft.     Tenderness: There is abdominal tenderness in the epigastric area.  Musculoskeletal:        General: Normal range of motion.     Cervical back: Normal range of motion.  Skin:    General: Skin is warm and dry.  Neurological:     General: No focal deficit present.     Mental Status: He is alert.  Psychiatric:        Mood and Affect: Mood normal.        Behavior: Behavior normal.     ED Results / Procedures / Treatments   Labs (all labs ordered are listed, but only abnormal results are displayed) Labs Reviewed  COMPREHENSIVE METABOLIC PANEL - Abnormal; Notable for the following components:      Result Value   Sodium 133 (*)    Chloride 96 (*)    Glucose, Bld 106 (*)    AST 83 (*)    ALT 60 (*)    Total  Bilirubin 2.2 (*)    All other components within normal limits  LIPASE, BLOOD - Abnormal; Notable for the following components:   Lipase 371 (*)    All other components within normal limits  CBC WITH DIFFERENTIAL/PLATELET - Abnormal; Notable for the following components:   MCV 100.9 (*)    MCH 35.9 (*)    All other components within normal limits  URINALYSIS, ROUTINE W REFLEX MICROSCOPIC - Abnormal; Notable for the following components:   Color, Urine AMBER (*)    Bilirubin Urine SMALL (*)    Ketones, ur 20 (*)    Protein, ur 30 (*)    All other components within normal limits    EKG None  Radiology CT ABDOMEN PELVIS W CONTRAST  Result Date: 04/30/2023 CLINICAL DATA:  Abdominal pain radiating to back for 2 days, severe acute pancreatitis EXAM: CT ABDOMEN AND PELVIS WITH CONTRAST TECHNIQUE: Multidetector CT imaging of the abdomen and pelvis was performed using  the standard protocol following bolus administration of intravenous contrast. RADIATION DOSE REDUCTION: This exam was performed according to the departmental dose-optimization program which includes automated exposure control, adjustment of the mA and/or kV according to patient size and/or use of iterative reconstruction technique. CONTRAST:  OMNIPAQUE IOHEXOL 300 MG/ML  SOLN COMPARISON:  06/07/2022 FINDINGS: Lower chest: No acute pleural or parenchymal lung disease. Hepatobiliary: Diffuse hepatic steatosis. No focal liver abnormality. The gallbladder is unremarkable. Pancreas: Mild peripancreatic edema involving the head and body of the pancreas. No fluid collection, pseudocyst, or abscess. No pancreatic duct dilation. Spleen: Normal in size without focal abnormality. Adrenals/Urinary Tract: Adrenal glands are unremarkable. Kidneys are normal, without renal calculi, focal lesion, or hydronephrosis. Bladder is decompressed, which limits its evaluation. Stomach/Bowel: No bowel obstruction or ileus. Mild wall thickening of the cecum and proximal ascending colon with trace pericolonic fat stranding. Please correlate for signs or symptoms of inflammatory or infectious colitis. Vascular/Lymphatic: There are small upper abdominal varices, greatest around the gastroesophageal junction and distal esophagus. Findings may reflect underlying portal hypertension. The splenic vein, SMV, and portal vein are patent. The portal vein is normal in caliber. No other significant vascular findings. No pathologic adenopathy. Reproductive: Prostate is unremarkable. Other: No free fluid or free intraperitoneal gas. No abdominal wall hernia. Musculoskeletal: No acute or destructive bony abnormalities. Reconstructed images demonstrate no additional findings. IMPRESSION: 1. Acute uncomplicated pancreatitis, with mild edema surrounding the head and body of the pancreas. No fluid collection, pseudocyst, or abscess. 2. Hepatic steatosis.  3. Nonspecific wall thickening and pericolonic fat stranding involving the cecum and ascending colon. While this could reflect inflammatory or infectious colitis, portal colopathy could be considered given the additional findings suggesting portal venous hypertension with upper abdominal varices surrounding the stomach and esophagus. Electronically Signed   By: Sharlet Salina M.D.   On: 04/30/2023 10:06    Procedures Procedures    Medications Ordered in ED Medications  morphine (PF) 4 MG/ML injection 4 mg (has no administration in time range)  ondansetron (ZOFRAN) injection 4 mg (has no administration in time range)  lactated ringers bolus 1,000 mL (0 mLs Intravenous Stopped 04/30/23 1054)  morphine (PF) 4 MG/ML injection 4 mg (4 mg Intravenous Given 04/30/23 0839)  ondansetron (ZOFRAN) injection 4 mg (4 mg Intravenous Given 04/30/23 0841)  iohexol (OMNIPAQUE) 300 MG/ML solution 100 mL (100 mLs Intravenous Contrast Given 04/30/23 0948)    ED Course/ Medical Decision Making/ A&P  Medical Decision Making Amount and/or Complexity of Data Reviewed Labs: ordered. Radiology: ordered.  Risk Prescription drug management.   This patient is a 42 y.o. male who presents to the ED for concern of epigastric abdominal pain, this involves an extensive number of treatment options, and is a complaint that carries with it a high risk of complications and morbidity. The emergent differential diagnosis prior to evaluation includes, but is not limited to,  Biliary colic, cholecystitis, hepatitis (viral, alcoholic, toxic), appendicitis, GERD/PUD, pancreatitis, malignancy, hepatic ischemia, right lower lobe pneumonia, intestinal ischemia, IBD  This is not an exhaustive differential.   Past Medical History / Co-morbidities / Social History:  has a past medical history of Pancreatitis, PTSD (post-traumatic stress disorder), and PTSD (post-traumatic stress  disorder).  Additional history: Chart reviewed. Pertinent results include: Followed by GI through the [place] in [place].  Unable to review their notes.  Physical Exam: Physical exam performed. The pertinent findings include: Epigastric abdominal TTP.  Lab Tests: I ordered, and personally interpreted labs.  The pertinent results include: No leukocytosis or anemia.  Lipase 371, Na 133, chloride 96, glucose 106, AST 83, ALT 60 T bili 2.2 consistent with previous. UA with ketones, non-infectious   Imaging Studies: I ordered imaging studies including Ct abdomen pelvis. I independently visualized and interpreted imaging which showed   1. Acute uncomplicated pancreatitis, with mild edema surrounding the head and body of the pancreas. No fluid collection, pseudocyst, or abscess. 2. Hepatic steatosis. 3. Nonspecific wall thickening and pericolonic fat stranding involving the cecum and ascending colon. While this could reflect inflammatory or infectious colitis, portal colopathy could be considered given the additional findings suggesting portal venous hypertension with upper abdominal varices surrounding the stomach and esophagus.  I agree with the radiologist interpretation.   Medications: I ordered medication including morphine, zofran, fluids  for pain, nausea, dehydration. Reevaluation of the patient after these medicines showed that the patient improved. I have reviewed the patients home medicines and have made adjustments as needed.   Disposition: After consideration of the diagnostic results and the patients response to treatment, I feel that emergency department workup does not suggest an emergent condition requiring admission or immediate intervention beyond what has been performed at this time. The plan is: Discharge with close outpatient follow-up and return precautions.  Patient's symptoms consistent with his pancreatitis.  Offered admission to the patient for same, he states he  would prefer to go home with oral pain medication.  He has had flares of pancreatitis several times previously and states he felt comfortable managing the dietary regimen at home. Will send for percocet and zofran for same.  He reports that he has a colonoscopy/endoscopy scheduled with his GI doctor next month.  Patient scan does show some inflammation around his colon, however upon review it does appear that this was present in January as well.  He has no leukocytosis or other signs or symptoms to suggest infectious etiology.  Therefore will defer antibiotics at this time. Evaluation and diagnostic testing in the emergency department does not suggest an emergent condition requiring admission or immediate intervention beyond what has been performed at this time.  Plan for discharge with close PCP follow-up.  Patient is understanding and amenable with plan, educated on red flag symptoms that would prompt immediate return.  Patient discharged in stable condition.   I discussed this case with my attending physician Dr. Renaye Rakers who cosigned this note including patient's presenting symptoms, physical exam, and planned diagnostics and interventions. Attending physician  stated agreement with plan or made changes to plan which were implemented.    Final Clinical Impression(s) / ED Diagnoses Final diagnoses:  Acute pancreatitis without infection or necrosis, unspecified pancreatitis type     Rx / DC Orders ED Discharge Orders          Ordered    oxyCODONE-acetaminophen (PERCOCET/ROXICET) 5-325 MG tablet  Every 6 hours PRN        04/30/23 1101    ondansetron (ZOFRAN-ODT) 4 MG disintegrating tablet  Every 8 hours PRN        04/30/23 1109          An After Visit Summary was printed and given to the patient.    Vear Clock 04/30/23 1112    Terald Sleeper, MD 05/01/23 [phone]

## 2023-05-01 DIAGNOSIS — Z818 Family history of other mental and behavioral disorders: Secondary | ICD-10-CM | POA: Diagnosis not present

## 2023-05-01 DIAGNOSIS — K766 Portal hypertension: Secondary | ICD-10-CM | POA: Diagnosis present

## 2023-05-01 DIAGNOSIS — E871 Hypo-osmolality and hyponatremia: Secondary | ICD-10-CM | POA: Diagnosis not present

## 2023-05-01 DIAGNOSIS — I8500 Esophageal varices without bleeding: Secondary | ICD-10-CM | POA: Diagnosis present

## 2023-05-01 DIAGNOSIS — Z1152 Encounter for screening for COVID-19: Secondary | ICD-10-CM | POA: Diagnosis not present

## 2023-05-01 DIAGNOSIS — K860 Alcohol-induced chronic pancreatitis: Secondary | ICD-10-CM | POA: Diagnosis not present

## 2023-05-01 DIAGNOSIS — Z8379 Family history of other diseases of the digestive system: Secondary | ICD-10-CM | POA: Diagnosis not present

## 2023-05-01 DIAGNOSIS — D7589 Other specified diseases of blood and blood-forming organs: Secondary | ICD-10-CM | POA: Diagnosis present

## 2023-05-01 DIAGNOSIS — K5900 Constipation, unspecified: Secondary | ICD-10-CM | POA: Diagnosis present

## 2023-05-01 DIAGNOSIS — F17210 Nicotine dependence, cigarettes, uncomplicated: Secondary | ICD-10-CM | POA: Diagnosis present

## 2023-05-01 DIAGNOSIS — R030 Elevated blood-pressure reading, without diagnosis of hypertension: Secondary | ICD-10-CM | POA: Diagnosis present

## 2023-05-01 DIAGNOSIS — K760 Fatty (change of) liver, not elsewhere classified: Secondary | ICD-10-CM | POA: Diagnosis present

## 2023-05-01 DIAGNOSIS — R1110 Vomiting, unspecified: Secondary | ICD-10-CM | POA: Diagnosis present

## 2023-05-01 DIAGNOSIS — F419 Anxiety disorder, unspecified: Secondary | ICD-10-CM | POA: Diagnosis present

## 2023-05-01 DIAGNOSIS — F4310 Post-traumatic stress disorder, unspecified: Secondary | ICD-10-CM | POA: Diagnosis present

## 2023-05-01 DIAGNOSIS — F1011 Alcohol abuse, in remission: Secondary | ICD-10-CM | POA: Diagnosis present

## 2023-05-01 DIAGNOSIS — K861 Other chronic pancreatitis: Secondary | ICD-10-CM | POA: Diagnosis present

## 2023-05-01 DIAGNOSIS — K859 Acute pancreatitis without necrosis or infection, unspecified: Secondary | ICD-10-CM | POA: Diagnosis present

## 2023-05-01 DIAGNOSIS — F32A Depression, unspecified: Secondary | ICD-10-CM | POA: Diagnosis present

## 2023-05-01 LAB — COMPREHENSIVE METABOLIC PANEL
ALT: 45 U/L — ABNORMAL HIGH (ref 0–44)
AST: 49 U/L — ABNORMAL HIGH (ref 15–41)
Albumin: 3.7 g/dL (ref 3.5–5.0)
Alkaline Phosphatase: 78 U/L (ref 38–126)
Anion gap: 12 (ref 5–15)
BUN: 13 mg/dL (ref 6–20)
CO2: 27 mmol/L (ref 22–32)
Calcium: 9.2 mg/dL (ref 8.9–10.3)
Chloride: 99 mmol/L (ref 98–111)
Creatinine, Ser: 0.6 mg/dL — ABNORMAL LOW (ref 0.61–1.24)
GFR, Estimated: >60 mL/min (ref >60)
Glucose, Bld: 102 mg/dL — ABNORMAL HIGH (ref 70–99)
Potassium: 4.1 mmol/L (ref 3.5–5.1)
Sodium: 138 mmol/L (ref 135–145)
Total Bilirubin: 2.1 mg/dL — ABNORMAL HIGH (ref <1.2)
Total Protein: 6.9 g/dL (ref 6.5–8.1)

## 2023-05-01 LAB — HEPATITIS PANEL, ACUTE
HCV Ab: NONREACTIVE
Hep A IgM: NONREACTIVE
Hep B C IgM: NONREACTIVE
Hepatitis B Surface Ag: NONREACTIVE

## 2023-05-01 LAB — CBC
HCT: 43.1 % (ref 39.0–52.0)
Hemoglobin: 14.4 g/dL (ref 13.0–17.0)
MCH: 34.9 pg — ABNORMAL HIGH (ref 26.0–34.0)
MCHC: 33.4 g/dL (ref 30.0–36.0)
MCV: 104.4 fL — ABNORMAL HIGH (ref 80.0–100.0)
Platelets: 152 10*3/uL (ref 150–400)
RBC: 4.13 MIL/uL — ABNORMAL LOW (ref 4.22–5.81)
RDW: 12.6 % (ref 11.5–15.5)
WBC: 14.8 10*3/uL — ABNORMAL HIGH (ref 4.0–10.5)
nRBC: 0 % (ref 0.0–0.2)

## 2023-05-01 LAB — GLUCOSE, CAPILLARY: Glucose-Capillary: 112 mg/dL — ABNORMAL HIGH (ref 70–99)

## 2023-05-01 MED ORDER — ALUM & MAG HYDROXIDE-SIMETH 200-200-20 MG/5ML PO SUSP
30.0000 mL | ORAL | Status: DC | PRN
  Administered 2023-05-01: 30 mL via ORAL
  Filled 2023-05-01: qty 30

## 2023-05-01 NOTE — Progress Notes (Signed)
  Progress Note   Patient: Jeffrey Mathis OZH:[id number] DOB: [birth year] DOA: 04/30/2023     0 DOS: the patient was seen and examined on 05/01/2023 at 11:06AM      Brief hospital course: 42 y.o. M with prior alcohol induced pancreatitis, PTSD, depression who presented with 2 days epigastric pain and nausea.     Assessment and Plan: Acute pancreatitis No improvement today, nauseated just with drinking water - IV fluids - CLD - Analgesics, antiemetics - Smoking cessation   Transaminitis Hyperbilirubinemia Imaging findings of portal hypertension - Trend LFTs - Outpatient GI Follow up   Elevated blood pressure Likely due to pain  Hyponatremia Mild, asymptomatic - Continue IV fluids       Subjective: Patient's had no improvement, he still quite nauseated, has been unable to tolerate anything by mouth still has generalized malaise       Physical Exam: BP (!) 152/80 (BP Location: Right Arm)   Pulse 74   Temp 98.1 F (36.7 C) (Oral)   Resp 18   Wt 70 kg   SpO2 95%   BMI 20.36 kg/m   Adult male, lying in bed, interactive and appropriate RRR, no murmurs, no peripheral edema Respiratory rate normal, lungs clear without rales or wheezes Abdomen soft with marked tenderness in the epigastrium, voluntary guarding, no rigidity or rebound Attention normal, affect normal, judgment and insight appear normal    Data Reviewed: CT imaging reviewed LFTs trending down White blood cell count trending up Hemoglobin normal, but macrocytic   Family Communication:     Disposition: Status is: Inpatient Still requiring IV fluids and IV opiates        Author: Alberteen Sam, MD 05/01/2023 4:25 PM  For on call review [email or web address].

## 2023-05-01 NOTE — Hospital Course (Signed)
42 y.o. M with prior alcohol induced pancreatitis, PTSD, depression who presented with 2 days epigastric pain and nausea.

## 2023-05-01 NOTE — Plan of Care (Signed)
Plan of Care reviewed. 

## 2023-05-02 DIAGNOSIS — K859 Acute pancreatitis without necrosis or infection, unspecified: Secondary | ICD-10-CM | POA: Diagnosis not present

## 2023-05-02 DIAGNOSIS — K760 Fatty (change of) liver, not elsewhere classified: Secondary | ICD-10-CM | POA: Diagnosis not present

## 2023-05-02 DIAGNOSIS — K860 Alcohol-induced chronic pancreatitis: Secondary | ICD-10-CM | POA: Diagnosis not present

## 2023-05-02 LAB — COMPREHENSIVE METABOLIC PANEL
ALT: 31 U/L (ref 0–44)
AST: 31 U/L (ref 15–41)
Albumin: 3.2 g/dL — ABNORMAL LOW (ref 3.5–5.0)
Alkaline Phosphatase: 65 U/L (ref 38–126)
Anion gap: 7 (ref 5–15)
BUN: <5 mg/dL — ABNORMAL LOW (ref 6–20)
CO2: 27 mmol/L (ref 22–32)
Calcium: 8.5 mg/dL — ABNORMAL LOW (ref 8.9–10.3)
Chloride: 96 mmol/L — ABNORMAL LOW (ref 98–111)
Creatinine, Ser: 0.55 mg/dL — ABNORMAL LOW (ref 0.61–1.24)
GFR, Estimated: >60 mL/min (ref >60)
Glucose, Bld: 96 mg/dL (ref 70–99)
Potassium: 3.8 mmol/L (ref 3.5–5.1)
Sodium: 130 mmol/L — ABNORMAL LOW (ref 135–145)
Total Bilirubin: 1.5 mg/dL — ABNORMAL HIGH (ref <1.2)
Total Protein: 5.9 g/dL — ABNORMAL LOW (ref 6.5–8.1)

## 2023-05-02 LAB — CBC
HCT: 41.6 % (ref 39.0–52.0)
Hemoglobin: 13.7 g/dL (ref 13.0–17.0)
MCH: 35 pg — ABNORMAL HIGH (ref 26.0–34.0)
MCHC: 32.9 g/dL (ref 30.0–36.0)
MCV: 106.4 fL — ABNORMAL HIGH (ref 80.0–100.0)
Platelets: 122 10*3/uL — ABNORMAL LOW (ref 150–400)
RBC: 3.91 MIL/uL — ABNORMAL LOW (ref 4.22–5.81)
RDW: 12.6 % (ref 11.5–15.5)
WBC: 15.5 10*3/uL — ABNORMAL HIGH (ref 4.0–10.5)
nRBC: 0 % (ref 0.0–0.2)

## 2023-05-02 LAB — GLUCOSE, CAPILLARY
Glucose-Capillary: 107 mg/dL — ABNORMAL HIGH (ref 70–99)
Glucose-Capillary: 124 mg/dL — ABNORMAL HIGH (ref 70–99)

## 2023-05-02 NOTE — Discharge Summary (Signed)
Physician Discharge Summary   Patient: Jeffrey Mathis MRN: [id number] DOB: 1980-12-05  Admit date:     04/30/2023  Discharge date: 05/02/23  Discharge Physician: Alberteen Sam   PCP: [facility]     Recommendations at discharge:  Follow up with Gastroenterology for pancreatitis, liver disease Please repeat LFTs in 2-4 weeks     Discharge Diagnoses: Principal Problem:   Acute pancreatitis Active Problems:   Chronic pancreatitis (HCC)   Hepatic steatosis   Macrocytosis   Transaminitis   Hyperbilirubinemia   Portal hypertension [facility])    Hospital Course: 42 y.o. M with prior alcohol induced pancreatitis, PTSD, depression who presented with 2 days epigastric pain and nausea.  CT in the ER confirmed mild pancreatitis without fluid collection.     Acute pancreatitis Patient admitted and started on IV fluids, bowel rest, analgesics and antiemetics.  His symptoms improved over 48 hours, he was able to advance his diet to a soft bland diet, was taking fluids well, and requested discharge.    Transaminitis Hyperbilirubinemia Macrocytosis Hepatic steatosis Imaging findings consistent with portal hypertension Patient denied current alcohol use.  CT imaging showed no CBD dilation, and so gallstone pancreatitis was doubted.  LFTs resolved to normal in the hospital, and he was discharged home, has follow up with GI pending.        The [facility] Controlled Substances Registry was reviewed for this patient prior to discharge.  Consultants: None Procedures performed: CT abdomen   Disposition: Home Diet recommendation:  Discharge Diet Orders (From admission, onward)     Start     Ordered   05/02/23 0000  Diet - low sodium heart healthy        05/02/23 1130             DISCHARGE MEDICATION: Allergies as of 05/02/2023   No Known Allergies      Medication List     TAKE these medications    ibuprofen 200 MG tablet Commonly  known as: ADVIL Take 400 mg by mouth as needed for headache.   ondansetron 4 MG disintegrating tablet Commonly known as: ZOFRAN-ODT Take 1 tablet (4 mg total) by mouth every 8 (eight) hours as needed for nausea or vomiting.   oxyCODONE-acetaminophen 5-325 MG tablet Commonly known as: PERCOCET/ROXICET Take 1 tablet by mouth every 6 (six) hours as needed for severe pain (pain score 7-10).         Discharge Instructions     Diet - low sodium heart healthy   Complete by: As directed    Discharge instructions   Complete by: As directed    You were admitted with pancreatitis. Thankfully this improved quickly Drink plenty of fluids Avoid fatty foods Stick with bland low protein foods for the next several days (bananas, rice, toast, crackers, applesauce, potatoes) Follow up with your GI doctor as planned   Increase activity slowly   Complete by: As directed        Discharge Exam: Filed Weights   05/01/23 2154 05/02/23 0500 05/02/23 0541  Weight: 72.6 kg 72.2 kg 73.7 kg    General: Pt is alert, awake, not in acute distress, sitting up in bed Cardiovascular: RRR, nl S1-S2, no murmurs appreciated.   No LE edema.   Respiratory: Normal respiratory rate and rhythm.  CTAB without rales or wheezes. Abdominal: Abdomen soft, no aguarding, only mild tenderness.  No distension or HSM.   Neuro/Psych: Strength symmetric in upper and lower extremities.  Judgment and insight appear normal.  Condition at discharge: fair  The results of significant diagnostics from this hospitalization (including imaging, microbiology, ancillary and laboratory) are listed below for reference.   Imaging Studies: CT ABDOMEN PELVIS W CONTRAST  Result Date: 04/30/2023 CLINICAL DATA:  Abdominal pain radiating to back for 2 days, severe acute pancreatitis EXAM: CT ABDOMEN AND PELVIS WITH CONTRAST TECHNIQUE: Multidetector CT imaging of the abdomen and pelvis was performed using the standard protocol following  bolus administration of intravenous contrast. RADIATION DOSE REDUCTION: This exam was performed according to the departmental dose-optimization program which includes automated exposure control, adjustment of the mA and/or kV according to patient size and/or use of iterative reconstruction technique. CONTRAST:  OMNIPAQUE IOHEXOL 300 MG/ML  SOLN COMPARISON:  06/07/2022 FINDINGS: Lower chest: No acute pleural or parenchymal lung disease. Hepatobiliary: Diffuse hepatic steatosis. No focal liver abnormality. The gallbladder is unremarkable. Pancreas: Mild peripancreatic edema involving the head and body of the pancreas. No fluid collection, pseudocyst, or abscess. No pancreatic duct dilation. Spleen: Normal in size without focal abnormality. Adrenals/Urinary Tract: Adrenal glands are unremarkable. Kidneys are normal, without renal calculi, focal lesion, or hydronephrosis. Bladder is decompressed, which limits its evaluation. Stomach/Bowel: No bowel obstruction or ileus. Mild wall thickening of the cecum and proximal ascending colon with trace pericolonic fat stranding. Please correlate for signs or symptoms of inflammatory or infectious colitis. Vascular/Lymphatic: There are small upper abdominal varices, greatest around the gastroesophageal junction and distal esophagus. Findings may reflect underlying portal hypertension. The splenic vein, SMV, and portal vein are patent. The portal vein is normal in caliber. No other significant vascular findings. No pathologic adenopathy. Reproductive: Prostate is unremarkable. Other: No free fluid or free intraperitoneal gas. No abdominal wall hernia. Musculoskeletal: No acute or destructive bony abnormalities. Reconstructed images demonstrate no additional findings. IMPRESSION: 1. Acute uncomplicated pancreatitis, with mild edema surrounding the head and body of the pancreas. No fluid collection, pseudocyst, or abscess. 2. Hepatic steatosis. 3. Nonspecific wall thickening  and pericolonic fat stranding involving the cecum and ascending colon. While this could reflect inflammatory or infectious colitis, portal colopathy could be considered given the additional findings suggesting portal venous hypertension with upper abdominal varices surrounding the stomach and esophagus. Electronically Signed   By: Sharlet Salina M.D.   On: 04/30/2023 10:06    Microbiology: Results for orders placed or performed during the hospital encounter of 05/17/21  Resp Panel by RT-PCR (Flu A&B, Covid) Nasopharyngeal Swab     Status: None   Collection Time: 05/17/21  8:33 AM   Specimen: Nasopharyngeal Swab; Nasopharyngeal(NP) swabs in vial transport medium  Result Value Ref Range Status   SARS Coronavirus 2 by RT PCR NEGATIVE NEGATIVE Final    Comment: (NOTE) SARS-CoV-2 target nucleic acids are NOT DETECTED.  The SARS-CoV-2 RNA is generally detectable in upper respiratory specimens during the acute phase of infection. The lowest concentration of SARS-CoV-2 viral copies this assay can detect is 138 copies/mL. A negative result does not preclude SARS-Cov-2 infection and should not be used as the sole basis for treatment or other patient management decisions. A negative result may occur with  improper specimen collection/handling, submission of specimen other than nasopharyngeal swab, presence of viral mutation(s) within the areas targeted by this assay, and inadequate number of viral copies(<138 copies/mL). A negative result must be combined with clinical observations, patient history, and epidemiological information. The expected result is Negative.  Fact Sheet for Patients:  BloggerCourse.com  Fact Sheet for Healthcare Providers:  SeriousBroker.it  This test is no t  yet approved or cleared by the [place] and  has been authorized for detection and/or diagnosis of SARS-CoV-2 by FDA under an Emergency Use Authorization  (EUA). This EUA will remain  in effect (meaning this test can be used) for the duration of the COVID-19 declaration under Section 564(b)(1) of the Act, 21 U.S.C.section 360bbb-3(b)(1), unless the authorization is terminated  or revoked sooner.       Influenza A by PCR NEGATIVE NEGATIVE Final   Influenza B by PCR NEGATIVE NEGATIVE Final    Comment: (NOTE) The Xpert Xpress SARS-CoV-2/FLU/RSV plus assay is intended as an aid in the diagnosis of influenza from Nasopharyngeal swab specimens and should not be used as a sole basis for treatment. Nasal washings and aspirates are unacceptable for Xpert Xpress SARS-CoV-2/FLU/RSV testing.  Fact Sheet for Patients: BloggerCourse.com  Fact Sheet for Healthcare Providers: SeriousBroker.it  This test is not yet approved or cleared by the [place] FDA and has been authorized for detection and/or diagnosis of SARS-CoV-2 by FDA under an Emergency Use Authorization (EUA). This EUA will remain in effect (meaning this test can be used) for the duration of the COVID-19 declaration under Section 564(b)(1) of the Act, 21 U.S.C. section 360bbb-3(b)(1), unless the authorization is terminated or revoked.  Performed at [facility] [address] [place], [place] [address]     Labs: CBC: Recent Labs  Lab 04/30/23 [id number] 05/01/23 0538 05/02/23 0551  WBC 7.9 14.8* 15.5*  NEUTROABS 6.0  --   --   HGB 15.5 14.4 13.7  HCT 43.6 43.1 41.6  MCV 100.9* 104.4* 106.4*  PLT 164 152 122*   Basic Metabolic Panel: Recent Labs  Lab 04/30/23 0843 05/01/23 0538 05/02/23 0551  NA 133* 138 130*  K 3.5 4.1 3.8  CL 96* 99 96*  CO2 25 27 27   GLUCOSE 106* 102* 96  BUN 6 13 <5*  CREATININE 0.64 0.60* 0.55*  CALCIUM 9.4 9.2 8.5*   Liver Function Tests: Recent Labs  Lab 04/30/23 0843 05/01/23 0538 05/02/23 0551  AST 83* 49* 31  ALT 60* 45* 31  ALKPHOS 91 78 65  BILITOT 2.2*  2.1* 1.5*  PROT 7.5 6.9 5.9*  ALBUMIN 4.0 3.7 3.2*   CBG: Recent Labs  Lab 05/01/23 0735 05/02/23 0738 05/02/23 1103  GLUCAP 112* 107* 124*    Discharge time spent: approximately 35 minutes spent on discharge counseling, evaluation of patient on day of discharge, and coordination of discharge planning with nursing, social work, pharmacy and case management  Signed: Alberteen Sam, MD [facility] 05/02/2023

## 2023-05-02 NOTE — Progress Notes (Signed)
   05/02/23 0952  TOC Brief Assessment  Insurance and Status Reviewed  Patient has primary care physician Yes  Home environment has been reviewed Single family home  Prior level of function: Independent  Prior/Current Home Services No current home services  Social Determinants of Health Reivew SDOH reviewed no interventions necessary  Readmission risk has been reviewed Yes  Transition of care needs no transition of care needs at this time

## 2023-10-30 ENCOUNTER — Emergency Department (HOSPITAL_COMMUNITY)
Admission: EM | Admit: 2023-10-30 | Discharge: 2023-10-30 | Disposition: A | Discharge status: Home / Self Care | Attending: Emergency Medicine | Admitting: Emergency Medicine

## 2023-10-30 ENCOUNTER — Encounter (HOSPITAL_COMMUNITY): Payer: Self-pay

## 2023-10-30 ENCOUNTER — Other Ambulatory Visit: Payer: Self-pay

## 2023-10-30 DIAGNOSIS — R7401 Elevation of levels of liver transaminase levels: Secondary | ICD-10-CM | POA: Diagnosis not present

## 2023-10-30 DIAGNOSIS — R000 Tachycardia, unspecified: Secondary | ICD-10-CM | POA: Insufficient documentation

## 2023-10-30 DIAGNOSIS — R109 Unspecified abdominal pain: Secondary | ICD-10-CM | POA: Diagnosis present

## 2023-10-30 DIAGNOSIS — R17 Unspecified jaundice: Secondary | ICD-10-CM | POA: Insufficient documentation

## 2023-10-30 DIAGNOSIS — R791 Abnormal coagulation profile: Secondary | ICD-10-CM | POA: Diagnosis not present

## 2023-10-30 DIAGNOSIS — R944 Abnormal results of kidney function studies: Secondary | ICD-10-CM | POA: Insufficient documentation

## 2023-10-30 DIAGNOSIS — I10 Essential (primary) hypertension: Secondary | ICD-10-CM | POA: Diagnosis not present

## 2023-10-30 DIAGNOSIS — D72829 Elevated white blood cell count, unspecified: Secondary | ICD-10-CM | POA: Insufficient documentation

## 2023-10-30 LAB — HEPATITIS PANEL, ACUTE
HCV Ab: NONREACTIVE
Hep A IgM: NONREACTIVE
Hep B C IgM: NONREACTIVE
Hepatitis B Surface Ag: NONREACTIVE

## 2023-10-30 LAB — CBC WITH DIFFERENTIAL/PLATELET
Abs Immature Granulocytes: 0.09 10*3/uL — ABNORMAL HIGH (ref 0.00–0.07)
Basophils Absolute: 0.1 10*3/uL (ref 0.0–0.1)
Basophils Relative: 1 %
Eosinophils Absolute: 0.3 10*3/uL (ref 0.0–0.5)
Eosinophils Relative: 2 %
HCT: 33.7 % — ABNORMAL LOW (ref 39.0–52.0)
Hemoglobin: 11.2 g/dL — ABNORMAL LOW (ref 13.0–17.0)
Immature Granulocytes: 1 %
Lymphocytes Relative: 13 %
Lymphs Abs: 1.9 10*3/uL (ref 0.7–4.0)
MCH: 35.2 pg — ABNORMAL HIGH (ref 26.0–34.0)
MCHC: 33.2 g/dL (ref 30.0–36.0)
MCV: 106 fL — ABNORMAL HIGH (ref 80.0–100.0)
Monocytes Absolute: 1.1 10*3/uL — ABNORMAL HIGH (ref 0.1–1.0)
Monocytes Relative: 7 %
Neutro Abs: 11.7 10*3/uL — ABNORMAL HIGH (ref 1.7–7.7)
Neutrophils Relative %: 76 %
Platelets: 220 10*3/uL (ref 150–400)
RBC: 3.18 MIL/uL — ABNORMAL LOW (ref 4.22–5.81)
RDW: 14.4 % (ref 11.5–15.5)
WBC: 15.1 10*3/uL — ABNORMAL HIGH (ref 4.0–10.5)
nRBC: 0 % (ref 0.0–0.2)

## 2023-10-30 LAB — PROTIME-INR
INR: 1.6 — ABNORMAL HIGH (ref 0.8–1.2)
Prothrombin Time: 18.9 s — ABNORMAL HIGH (ref 11.4–15.2)

## 2023-10-30 LAB — COMPREHENSIVE METABOLIC PANEL WITH GFR
ALT: 50 U/L — ABNORMAL HIGH (ref 0–44)
AST: 119 U/L — ABNORMAL HIGH (ref 15–41)
Albumin: 2.4 g/dL — ABNORMAL LOW (ref 3.5–5.0)
Alkaline Phosphatase: 141 U/L — ABNORMAL HIGH (ref 38–126)
Anion gap: 7 (ref 5–15)
BUN: 6 mg/dL (ref 6–20)
CO2: 24 mmol/L (ref 22–32)
Calcium: 8.2 mg/dL — ABNORMAL LOW (ref 8.9–10.3)
Chloride: 102 mmol/L (ref 98–111)
Creatinine, Ser: 0.6 mg/dL — ABNORMAL LOW (ref 0.61–1.24)
GFR, Estimated: >60 mL/min (ref >60)
Glucose, Bld: 112 mg/dL — ABNORMAL HIGH (ref 70–99)
Potassium: 3.2 mmol/L — ABNORMAL LOW (ref 3.5–5.1)
Sodium: 133 mmol/L — ABNORMAL LOW (ref 135–145)
Total Bilirubin: 12.4 mg/dL — ABNORMAL HIGH (ref 0.0–1.2)
Total Protein: 6.7 g/dL (ref 6.5–8.1)

## 2023-10-30 LAB — LIPASE, BLOOD: Lipase: 30 U/L (ref 11–51)

## 2023-10-30 LAB — ETHANOL: Alcohol, Ethyl (B): <15 mg/dL (ref <15)

## 2023-10-30 NOTE — Discharge Instructions (Addendum)
 Please call your GI specialist or [facility] gastroenterology TOMORROW for an urgent outpatient follow-up for further evaluation and management of your condition.  Return probably to the ER if you develop fever, significant abdominal pain, persistent vomiting, or if you have other concern.

## 2023-10-30 NOTE — ED Provider Notes (Signed)
 [place] EMERGENCY DEPARTMENT AT [facility] Provider Note   CSN: [id number] Arrival date & time: 10/30/23  1742     History  Chief Complaint  Patient presents with   Abdominal Pain    Jeffrey Mathis is a 43 y.o. male.  The history is provided by the patient and medical records. No language interpreter was used.  Abdominal Pain    43 year old male history of alcohol-related pancreatitis, hepatic steatosis, esophageal varices, portal hypertension, PTSD, presenting with complaint for abdominal pain.  Patient report progressive worsening abdominal discomfort and abdominal distention ongoing for "a while".  Endorses occasional episodes of nausea but no vomiting.  Denies having any fever or chills no chest pain or shortness of breath.  He has noticed progressive worsening jaundice.  He mention he was seen at an outside hospital couple days ago for his symptoms.  He had a CT scan done and his PCP told him to come to the ER today due to potential small bowel obstruction and elevated total bili of 10.  Last bowel movement was today.  He is able to pass flatus.  His last alcohol use was more than a year ago.  Denies any recent medication changes.  Home Medications Prior to Admission medications   Medication Sig Start Date End Date Taking? Authorizing Provider  ibuprofen  (ADVIL ) 200 MG tablet Take 400 mg by mouth as needed for headache.    [provider]  ondansetron  (ZOFRAN -ODT) 4 MG disintegrating tablet Take 1 tablet (4 mg total) by mouth every 8 (eight) hours as needed for nausea or vomiting. 04/30/23   Smoot, Genevive Ket, PA-C  oxyCODONE -acetaminophen  (PERCOCET/ROXICET) 5-325 MG tablet Take 1 tablet by mouth every 6 (six) hours as needed for severe pain (pain score 7-10). 04/30/23   Smoot, Genevive Ket, PA-C      Allergies    Patient has no known allergies.    Review of Systems   Review of Systems  Gastrointestinal:  Positive for abdominal pain.  All other systems  reviewed and are negative.   Physical Exam Updated Vital Signs BP (!) 143/72   Pulse (!) 104   Temp 98.8 F (37.1 C) (Oral)   Resp 18   Ht 6\' 1"  (1.854 m)   Wt 71.2 kg   SpO2 97%   BMI 20.71 kg/m  Physical Exam Constitutional:      General: He is not in acute distress.    Appearance: He is well-developed.  HENT:     Head: Atraumatic.  Eyes:     General: Scleral icterus present.     Conjunctiva/sclera: Conjunctivae normal.  Cardiovascular:     Rate and Rhythm: Tachycardia present.  Abdominal:     General: Bowel sounds are normal. There is distension.     Tenderness: There is abdominal tenderness (Mild generalized tenderness no guarding or rebound tenderness.).  Musculoskeletal:     Cervical back: Normal range of motion and neck supple.  Skin:    Findings: No rash.  Neurological:     Mental Status: He is alert.     ED Results / Procedures / Treatments   Labs (all labs ordered are listed, but only abnormal results are displayed) Labs Reviewed  CBC WITH DIFFERENTIAL/PLATELET - Abnormal; Notable for the following components:      Result Value   WBC 15.1 (*)    RBC 3.18 (*)    Hemoglobin 11.2 (*)    HCT 33.7 (*)    MCV 106.0 (*)    MCH 35.2 (*)  Neutro Abs 11.7 (*)    Monocytes Absolute 1.1 (*)    Abs Immature Granulocytes 0.09 (*)    All other components within normal limits  COMPREHENSIVE METABOLIC PANEL WITH GFR - Abnormal; Notable for the following components:   Sodium 133 (*)    Potassium 3.2 (*)    Glucose, Bld 112 (*)    Creatinine, Ser 0.60 (*)    Calcium 8.2 (*)    Albumin 2.4 (*)    AST 119 (*)    ALT 50 (*)    Alkaline Phosphatase 141 (*)    Total Bilirubin 12.4 (*)    All other components within normal limits  PROTIME-INR - Abnormal; Notable for the following components:   Prothrombin Time 18.9 (*)    INR 1.6 (*)    All other components within normal limits  LIPASE, BLOOD  ETHANOL  URINALYSIS, ROUTINE W REFLEX MICROSCOPIC  HEPATITIS  PANEL, ACUTE    EKG None  Radiology No results found.  Date/Time Radiology Report Provider Source  Oct 29, 2023 10:58 AM CT ABD/PEL WITH IV CONTRAST: Mcclay,Caillou G [id number] M Exm Date: Oct 29, 2023@10 :82 Req Phys: Casandra Claw A Pat Loc: KER/EM/UCC (Req'g Loc) Img Loc: K[facility]-CT 02 Service: [facility] [place], [place] [address] [phone]   (Case [id number] COMPLETE) CT ABD/PEL WITH IV CONTRAST (CT Detailed) [id number] Contrast Media : Non-ionic Iodinated Reason for Study: abdominal pain since yesterday  Clinical History: patient dx last week with "liver disease"  Report Status: Verified Date Reported: Oct 29, 2023 Date Verified: Oct 29, 2023 Verifier E-Sig:/[email or web address] Rexie Catena, MD  Report: History: abdominal pain since yesterday  Comparison: Oct 15, 2022  Technique: CT of the abdomen and pelvis was performed after IV administration of 100 cc Omnipaque  350. Coronal and sagittal reformats were generated.  Findings:  A new left pleural effusion is present with dependent atelectasis. The heart is prominent in size.  Periesophageal varix formation is identified, and there is new moderate ascites within the upper abdomen, extensive within the pelvis. The spleen is enlarged at 14.1 cm on coronal images. Enhancement within the liver is heterogeneous with multiple areas of ill-defined hypoattenuation (for example, posteriorly in the right hepatic lobe on image 33, series 3, peripherally in the right hepatic lobe on image 50, series 3, inferiorly in the right hepatic lobe on image 89, series 3). The liver is markedly enlarged at 21.3 cm in the midclavicular line. The portal vein is patent.  The gallbladder is distended. There is no dilatation of the biliary tree or the pancreatic duct. No worrisome pancreatic lesion is seen.  No renal mass or hydronephrosis is present.  Prominent fat is again identified within the wall of  the ascending and transverse colon. There is no evidence of bowel obstruction. The urinary bladder appears somewhat thick walled. The prostate is unremarkable.  No aggressive osseous lesion is seen.     Impression:   1. Hepatomegaly and heterogeneous areas of hypo-enhancement within the liver with new left pleural effusion, varix formation, mild splenomegaly, and extensive ascites raises concern for possible acute hepatitis in the setting of chronic hepatocellular disease. Clinical correlation is recommended with gastroenterology consultation.  2. The areas of heterogeneous enhancement within the liver could be related to hepatitis; however, an underlying lesion is a consideration given chronic hepatocellular disease (marked steatotic change was visible on prior CT a year ago). When this episode has subsided, dedicated hepatic imaging utilizing MRI or multiphase CT is advised.  3. Prominent  fat within the wall of the colon is again seen, a finding that can be present with inflammatory bowel disease.  Electronically Signed By: Vangie Genet Electronically Signed On: 10/29/2023 1:02 PM  Primary Diagnostic Code: SIGNIFICANT ABNORMALITY, ATTN NEEDED  Primary Interpreting Staff: Vangie Genet, MD, RADIOLOGIST (Verifier) Verba Girt      Procedures Procedures    Medications Ordered in ED Medications - No data to display  ED Course/ Medical Decision Making/ A&P                                 Medical Decision Making Amount and/or Complexity of Data Reviewed Labs: ordered.   BP (!) 143/72   Pulse (!) 104   Temp 98.8 F (37.1 C) (Oral)   Resp 18   Ht 6\' 1"  (1.854 m)   Wt 71.2 kg   SpO2 97%   BMI 20.71 kg/m   59:70 PM  43 year old male history of alcohol-related pancreatitis, hepatic steatosis, esophageal varices, portal hypertension, PTSD, presenting with complaint for abdominal pain.  Patient report progressive worsening abdominal discomfort and abdominal  distention ongoing for "a while".  Endorses occasional episodes of nausea but no vomiting.  Denies having any fever or chills no chest pain or shortness of breath.  He has noticed progressive worsening jaundice.  He mention he was seen at an outside hospital couple days ago for his symptoms.  He had a CT scan done and his PCP told him to come to the ER today due to potential small bowel obstruction and elevated total bili of 10.  Last bowel movement was today.  He is able to pass flatus.  His last alcohol use was more than a year ago.  Denies any recent medication changes.  On exam patient appears jaundiced, he does have a distended abdomen mildly tender to palpation diffusely.  Overall he is in no acute discomfort.  -Labs ordered, independently viewed and interpreted by me.  Labs remarkable for white count of 15.1 similar to prior.  Evidence of transaminitis with AST 119 ALT 50 alk phos 141 and now total bili is 12.4.  His total bili from 2 days ago was 10.5.  Normal lipase.  INR of 1.6.  Alcohol undetectable.  Acute hepatitis panel have not resulted yet -The patient was maintained on a cardiac monitor.  I personally viewed and interpreted the cardiac monitored which showed an underlying rhythm of: Sinus tachycardia -Imaging including abdominal pelvis CT scan that was done yesterday outpatient with results listed above. -This patient presents to the ED for concern of jaundice, this involves an extensive number of treatment options, and is a complaint that carries with it a high risk of complications and morbidity.  The differential diagnosis includes liver failure, Tylenol  toxicity, liver cancer, hepatitis, SBP -Co morbidities that complicate the patient evaluation includes alcohol-related pancreatitis, hepatic steatosis, hypertension -Treatment includes observation -Reevaluation of the patient after these medicines showed that the patient stayed the same -PCP office notes or outside notes  reviewed -Discussion with specialist GI specialist DR. Magod who recommend pt to f/u with GI closely outpt for further care.  Does not think pt need any inpt evaluation at this time.  -Escalation to admission/observation considered: patients feels much better, is comfortable with discharge, and will follow up with PCP -Prescription medication considered, patient comfortable with no medication -Social Determinant of Health considered which includes tobacco use  Final Clinical Impression(s) / ED Diagnoses Final diagnoses:  Jaundice    Rx / DC Orders ED Discharge Orders     None         Debbra Fairy, PA-C 10/30/23 2113    Dalene Duck, MD 10/30/23 [phone]

## 2023-10-30 NOTE — ED Triage Notes (Addendum)
 Seen at [facility] yesterday and had blood work and CT of abdomen. Pt told to come to ER for possible bowel obstruction, fluid on outside sac of lung and elevated bilirubin. Bilirubin total 10.7, direct 8.3 Generalized abdominal pain and nausea with dry heaves. Passing gas and last BM today

## 2023-10-31 ENCOUNTER — Other Ambulatory Visit: Payer: Self-pay

## 2023-10-31 ENCOUNTER — Emergency Department (HOSPITAL_COMMUNITY)
Admission: EM | Admit: 2023-10-31 | Discharge: 2023-10-31 | Disposition: A | Discharge status: Home / Self Care | Attending: Emergency Medicine | Admitting: Emergency Medicine

## 2023-10-31 DIAGNOSIS — R17 Unspecified jaundice: Secondary | ICD-10-CM | POA: Diagnosis not present

## 2023-10-31 DIAGNOSIS — R000 Tachycardia, unspecified: Secondary | ICD-10-CM | POA: Diagnosis not present

## 2023-10-31 DIAGNOSIS — D72829 Elevated white blood cell count, unspecified: Secondary | ICD-10-CM | POA: Insufficient documentation

## 2023-10-31 DIAGNOSIS — E876 Hypokalemia: Secondary | ICD-10-CM | POA: Diagnosis present

## 2023-10-31 LAB — COMPREHENSIVE METABOLIC PANEL WITH GFR
ALT: 46 U/L — ABNORMAL HIGH (ref 0–44)
AST: 117 U/L — ABNORMAL HIGH (ref 15–41)
Albumin: 2.1 g/dL — ABNORMAL LOW (ref 3.5–5.0)
Alkaline Phosphatase: 135 U/L — ABNORMAL HIGH (ref 38–126)
Anion gap: 9 (ref 5–15)
BUN: 6 mg/dL (ref 6–20)
CO2: 21 mmol/L — ABNORMAL LOW (ref 22–32)
Calcium: 8 mg/dL — ABNORMAL LOW (ref 8.9–10.3)
Chloride: 104 mmol/L (ref 98–111)
Creatinine, Ser: 0.59 mg/dL — ABNORMAL LOW (ref 0.61–1.24)
GFR, Estimated: >60 mL/min (ref >60)
Glucose, Bld: 148 mg/dL — ABNORMAL HIGH (ref 70–99)
Potassium: 3 mmol/L — ABNORMAL LOW (ref 3.5–5.1)
Sodium: 134 mmol/L — ABNORMAL LOW (ref 135–145)
Total Bilirubin: 11.2 mg/dL — ABNORMAL HIGH (ref 0.0–1.2)
Total Protein: 6.1 g/dL — ABNORMAL LOW (ref 6.5–8.1)

## 2023-10-31 LAB — PROTIME-INR
INR: 1.7 — ABNORMAL HIGH (ref 0.8–1.2)
Prothrombin Time: 19.8 s — ABNORMAL HIGH (ref 11.4–15.2)

## 2023-10-31 LAB — CBC
HCT: 34.1 % — ABNORMAL LOW (ref 39.0–52.0)
Hemoglobin: 11.3 g/dL — ABNORMAL LOW (ref 13.0–17.0)
MCH: 35.1 pg — ABNORMAL HIGH (ref 26.0–34.0)
MCHC: 33.1 g/dL (ref 30.0–36.0)
MCV: 105.9 fL — ABNORMAL HIGH (ref 80.0–100.0)
Platelets: 224 10*3/uL (ref 150–400)
RBC: 3.22 MIL/uL — ABNORMAL LOW (ref 4.22–5.81)
RDW: 14.3 % (ref 11.5–15.5)
WBC: 16.9 10*3/uL — ABNORMAL HIGH (ref 4.0–10.5)
nRBC: 0 % (ref 0.0–0.2)

## 2023-10-31 LAB — MAGNESIUM: Magnesium: 1.9 mg/dL (ref 1.7–2.4)

## 2023-10-31 MED ORDER — POTASSIUM CHLORIDE CRYS ER 20 MEQ PO TBCR
40.0000 meq | EXTENDED_RELEASE_TABLET | Freq: Once | ORAL | Status: AC
  Administered 2023-10-31: 40 meq via ORAL
  Filled 2023-10-31: qty 2

## 2023-10-31 MED ORDER — POTASSIUM CHLORIDE ER 20 MEQ PO TBCR: 20.0000 meq | EXTENDED_RELEASE_TABLET | Freq: Every day | ORAL | 0 refills | Status: DC

## 2023-10-31 NOTE — Discharge Instructions (Signed)
 Please follow-up with a gastroenterologist either outpatient with [facility] or through the [facility] system for your liver disease.  It is important that you take a potassium supplements prescribed for your low potassium.  You should return to the ER if you have new or worsening vomiting, abdominal pain, confusion, worsening yellowing of your skin or eyes.

## 2023-10-31 NOTE — ED Triage Notes (Signed)
 [facility]. Was seen yesterday in ER .

## 2023-10-31 NOTE — ED Provider Notes (Signed)
 [place] EMERGENCY DEPARTMENT AT [facility] Provider Note   CSN: [id number] Arrival date & time: 10/31/23  4098     History  Chief Complaint  Patient presents with   Abnormal Lab    Jeffrey Mathis is a 43 y.o. male with a history of chronic alcohol related pancreatitis, esophageal varices, portal hypertension, hepatic steatosis, presenting back to the ED at the behest of his [place] hospital.  Patient reports that symptomatically he was feeling a lot better today, but when he spoke to the provider at the [facility], he was told to come back to the ER.  This is due to the fact that the [place] said they would not be able to refer him or have him seen by a GI doctor within their system for likely several weeks or months.  Unfortunately the patient says he is uninsured and cannot afford to see a private practice outside the [place].  Therefore he was told to come back to the emergency department.  He reports to me that he has been eating fine and moving his bowels, and that he feels that his abdominal pain is in fact improved over the past 2 days since he left the ED.  He denies any shortness of breath.  Patient was seen in the emergency department yesterday for abdominal distention, concern for elevated bilirubin levels.  He had a CT scan of the abdomen performed at the [place] system which was obtained, the report, by ED team yesterday.  Per my review of these records, they showed a distended gallbladder, markedly enlarged liver, hypoattenuation lesions of the liver, moderate ascites of the abdomen and pelvis, splenic enlargement, paraesophageal varices.  Yesterday in the ED the patient had lab work which was notable for lower total bilirubin level of 12.4.  Potassium 3.2.  Creatinine 0.6.  AST 119, ALT 50, alk phos 141, albumin 2.4.  White blood cell count was 15.1, hemoglobin 11.2.  INR was 1.6.  Lipase level was normal.  Per ED provider note, GI was consulted at the time and recommended an acute  hepatitis panel, which was obtained and returned negative, and close outpatient GI follow-up.  The patient was discharged home.  HPI     Home Medications Prior to Admission medications   Medication Sig Start Date End Date Taking? Authorizing Provider  potassium chloride  20 MEQ TBCR Take 1 tablet (20 mEq total) by mouth daily for 15 days. 10/31/23 11/15/23 Yes Anson Peddie, Janalyn Me, MD  ibuprofen  (ADVIL ) 200 MG tablet Take 400 mg by mouth as needed for headache.    [provider]  ondansetron  (ZOFRAN -ODT) 4 MG disintegrating tablet Take 1 tablet (4 mg total) by mouth every 8 (eight) hours as needed for nausea or vomiting. 04/30/23   Smoot, Genevive Ket, PA-C  oxyCODONE -acetaminophen  (PERCOCET/ROXICET) 5-325 MG tablet Take 1 tablet by mouth every 6 (six) hours as needed for severe pain (pain score 7-10). 04/30/23   Smoot, Genevive Ket, PA-C      Allergies    Patient has no known allergies.    Review of Systems   Review of Systems  Physical Exam Updated Vital Signs BP 125/78   Pulse (!) 115   Temp 97.8 F (36.6 C) (Oral)   Resp (!) 24   Ht 6\' 1"  (1.854 m)   Wt 71.3 kg   SpO2 98%   BMI 20.72 kg/m  Physical Exam Constitutional:      General: He is not in acute distress. HENT:  Head: Normocephalic and atraumatic.  Eyes:     General: Scleral icterus present.     Pupils: Pupils are equal, round, and reactive to light.  Cardiovascular:     Rate and Rhythm: Regular rhythm. Tachycardia present.  Pulmonary:     Effort: Pulmonary effort is normal. No respiratory distress.  Abdominal:     Tenderness: There is no abdominal tenderness.     Comments: There is abdominal distention with a fluid wave, no significant tenderness  Skin:    General: Skin is warm and dry.     Coloration: Skin is jaundiced.  Neurological:     General: No focal deficit present.     Mental Status: He is alert. Mental status is at baseline.  Psychiatric:        Mood and Affect: Mood normal.        Behavior:  Behavior normal.     ED Results / Procedures / Treatments   Labs (all labs ordered are listed, but only abnormal results are displayed) Labs Reviewed  COMPREHENSIVE METABOLIC PANEL WITH GFR - Abnormal; Notable for the following components:      Result Value   Sodium 134 (*)    Potassium 3.0 (*)    CO2 21 (*)    Glucose, Bld 148 (*)    Creatinine, Ser 0.59 (*)    Calcium 8.0 (*)    Total Protein 6.1 (*)    Albumin 2.1 (*)    AST 117 (*)    ALT 46 (*)    Alkaline Phosphatase 135 (*)    Total Bilirubin 11.2 (*)    All other components within normal limits  CBC - Abnormal; Notable for the following components:   WBC 16.9 (*)    RBC 3.22 (*)    Hemoglobin 11.3 (*)    HCT 34.1 (*)    MCV 105.9 (*)    MCH 35.1 (*)    All other components within normal limits  PROTIME-INR - Abnormal; Notable for the following components:   Prothrombin Time 19.8 (*)    INR 1.7 (*)    All other components within normal limits  MAGNESIUM     EKG None  Radiology No results found.  Procedures Procedures    Medications Ordered in ED Medications  potassium chloride  SA (KLOR-CON  M) CR tablet 40 mEq (40 mEq Oral Given 10/31/23 1052)    ED Course/ Medical Decision Making/ A&P Clinical Course as of 10/31/23 1059  Thu Oct 31, 2023  1057 Reviewed workup with the patient.  Will start him on potassium supplements now which he is not taking.  Otherwise he is feeling well and wanting to leave.  He reports he spoke to the [place] and they are able to perform a therapeutic paracentesis today at [facility] this afternoon - he will be going to their ED [[place]]    Clinical Course User Index [MT] Anny Sayler, Janalyn Me, MD                                 Medical Decision Making Amount and/or Complexity of Data Reviewed Labs: ordered.  Risk Prescription drug management.   This patient presents to the ED with concern for follow-up evaluation and care plan given the patient's ascites and progressing liver  disease.  The patient's primary concern is that there is a delay and [facility] referral in the system to a gastroenterologist, and he cannot afford to see outpatient private GI (  [facility]) as referred from ED yesterday.  The patient does not in fact have any new acute medical complaints from his visit in the ED 2 days ago, and is moving his bowels, eating, and reporting improvement of his pain overall.  I do think it is reasonable to recheck his liver enzymes and bilirubin level for a trend.  I reviewed his external records as noted above including [facility] CT imaging as well as lab work from yesterday.  Co-morbidities that complicate the patient evaluation: Cirrhosis, chronic pancreatitis, known liver disease  I ordered and personally interpreted labs.  The pertinent results include: Overall stable liver labs including transaminitis, mild, NT bilirubin level mildly lower today at 11.2.  Chronic leukocytosis.  INR 1.7.  Potassium with worsening hypokalemia 3.0.  The patient was maintained on a cardiac monitor.  I personally viewed and interpreted the cardiac monitored which showed an underlying rhythm of: Sinus rhythm and sinus tachycardia  I ordered medication including oral potassium for hypokalemia  I have reviewed the patients home medicines and have made adjustments as needed  Test Considered: No indication for emergent repeat CT scan at this time  Disposition:  After consideration of the diagnostic results and the patients response to treatment, I feel that the patent would benefit from close outpatient follow-up.         Final Clinical Impression(s) / ED Diagnoses Final diagnoses:  Hypokalemia    Rx / DC Orders ED Discharge Orders          Ordered    potassium chloride  20 MEQ TBCR  Daily        10/31/23 1058              Arvilla Birmingham, MD 10/31/23 1059

## 2024-02-18 IMAGING — MR MR 3D RECON AT SCANNER
18 of 21 series · 41 of 48 positions shown · IV contrast (gadavist)
Comparison: No prior abdominal MRI. CT the abdomen and pelvis
11/03/2021.

CLINICAL DATA: 40-year-old male with history of severe acute
pancreatitis.

EXAM:
MRI ABDOMEN WITHOUT AND WITH CONTRAST (INCLUDING MRCP)
TECHNIQUE: Multiplanar multisequence MR imaging of the abdomen was performed
both before and after the administration of intravenous contrast.
Heavily T2-weighted images of the biliary and pancreatic ducts were
obtained, and three-dimensional MRCP images were rendered by post
processing.
CONTRAST:  7mL GADAVIST GADOBUTROL 1 MMOL/ML IV SOLN

[Series 3: T2 fat-sat · axial · 6.0mm · 1.25mm/px · 1 of 42 slices shown]
[im 1/42]
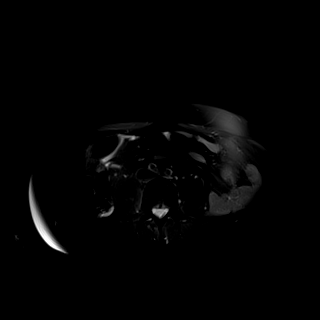

[Series 5: DWI · axial · 6.0mm · 1.49mm/px · z∈[-120,+175]mm · 3 of 84 slices shown (1 of 2)]
[im 1/84]
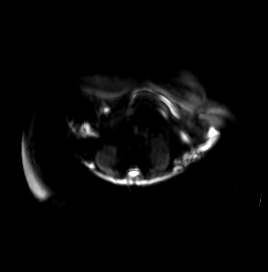
[im 42/84]
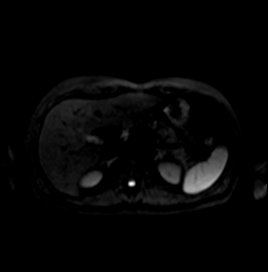
[im 84/84]
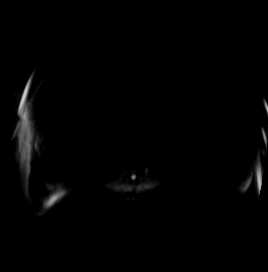

[Series 6: DWI · axial · 6.0mm · 1.49mm/px · 1 of 42 slices shown (2 of 2)]
[im 1/42]
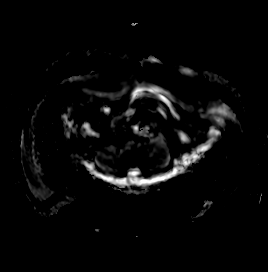

[Series 8: cor_3d_spc_trig · coronal · 1.0mm · 0.49mm/px · 2 of 72 slices shown]
[im 1/72]
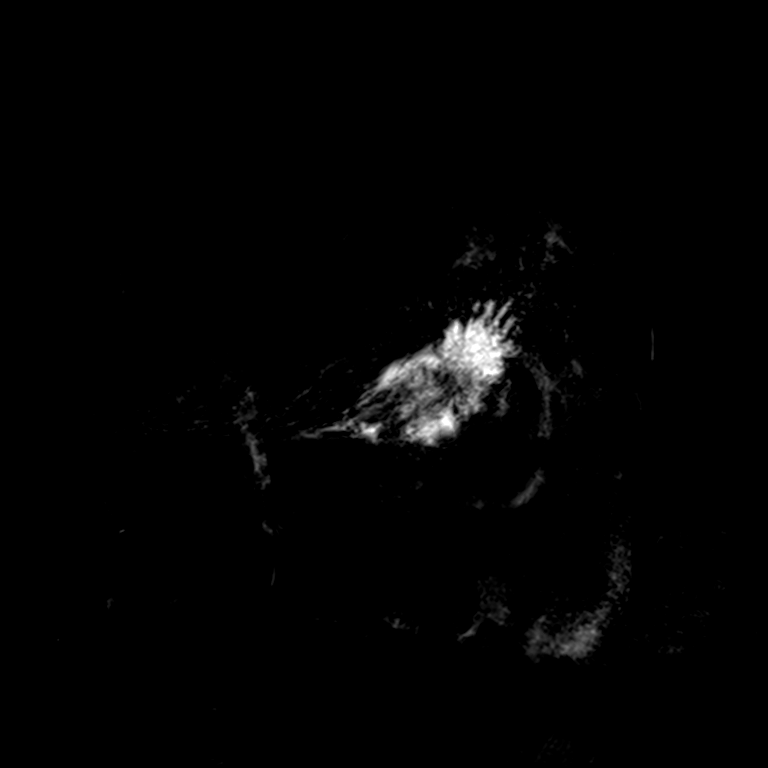
[im 72/72]
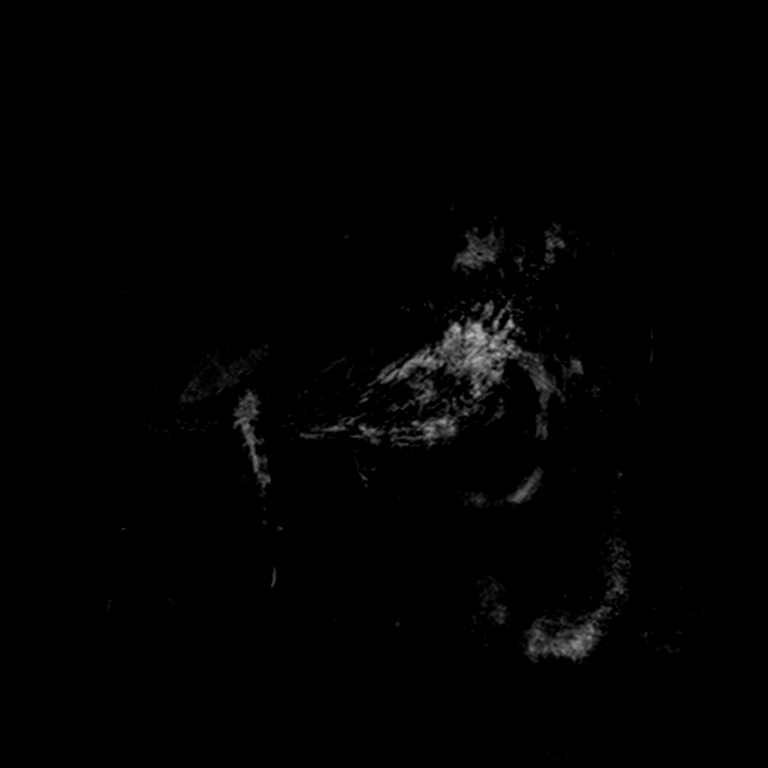

[Series 13: T2 · coronal · 6.0mm · 1.48mm/px · 1 of 33 slices shown (1 of 2)]
[im 1/33]
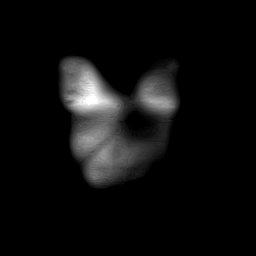

[Series 14: T1 · axial · 3.5mm · 1.25mm/px · z∈[-111,+165]mm · 3 of 80 slices shown (1 of 2)]
[im 1/80]
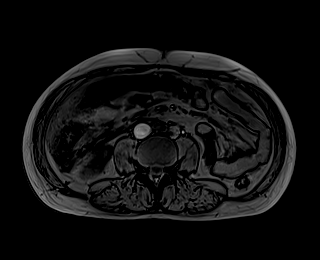
[im 40/80]
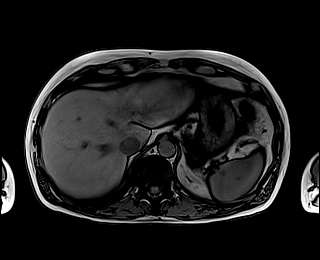
[im 80/80]
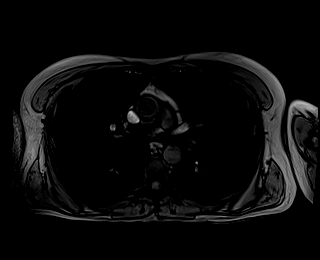

[Series 15: T1 · axial · 3.5mm · 1.25mm/px · z∈[-111,+165]mm · 3 of 80 slices shown (2 of 2)]
[im 1/80]
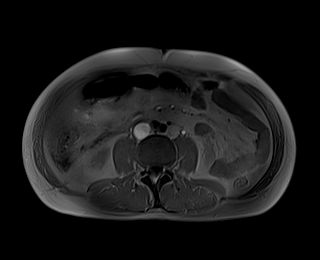
[im 40/80]
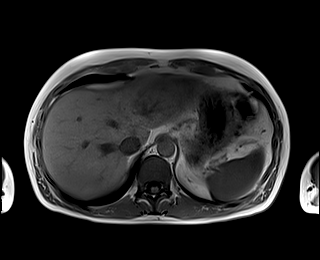
[im 80/80]
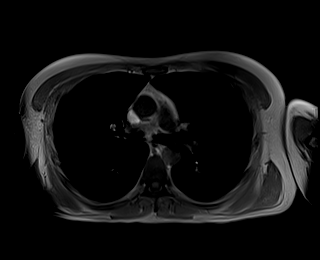

[Series 16: T2 · axial · 6.0mm · 1.56mm/px · 1 of 42 slices shown (2 of 2)]
[im 1/42]
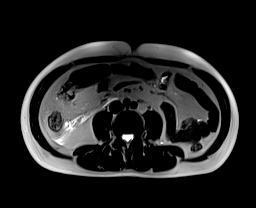

[Series 17: cor obl thk · sagittal · 50.0mm · 0.78mm/px · 1 of 9 slices shown]
[im 1/9]
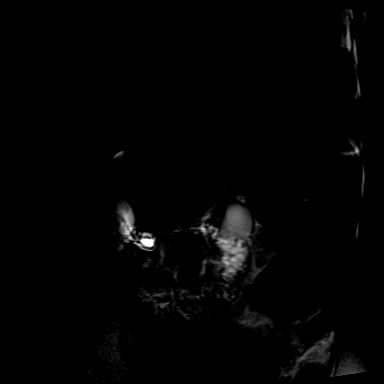

[Series 19: T1 dynamic · axial · 3.0mm · 1.25mm/px · z∈[-115,+170]mm · 3 of 96 slices shown (1 of 9)]
[im 1/96]
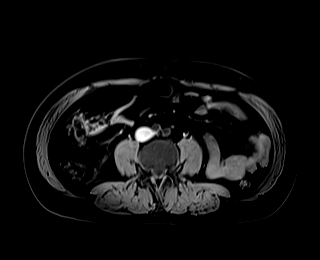
[im 48/96]
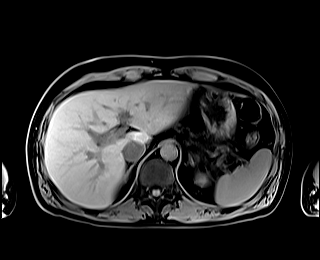
[im 96/96]
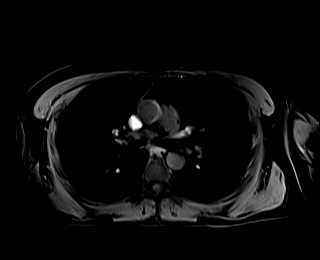

[Series 23: T1 dynamic · axial · 3.0mm · 1.25mm/px · z∈[-115,+170]mm · 3 of 96 slices shown (2 of 9)]
[im 1/96]
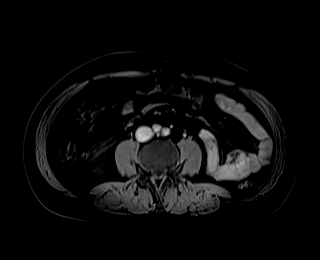
[im 48/96]
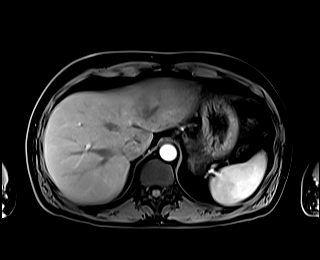
[im 96/96]
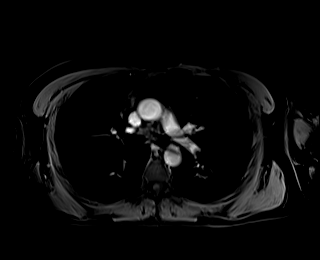

[Series 24: T1 dynamic · axial · 3.0mm · 1.25mm/px · z∈[-115,+170]mm · 3 of 96 slices shown (3 of 9)]
[im 1/96]
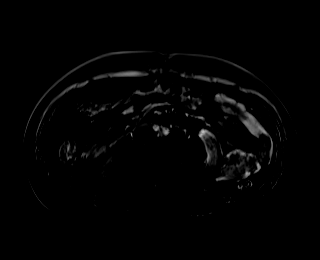
[im 48/96]
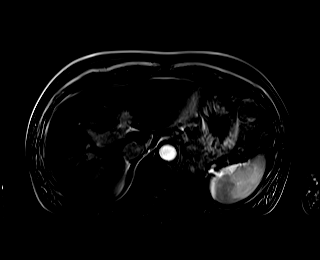
[im 96/96]
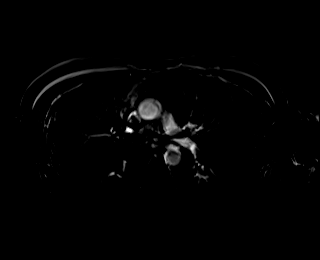

[Series 27: T1 dynamic · axial · 3.0mm · 1.25mm/px · z∈[-115,+170]mm · 3 of 96 slices shown (4 of 9)]
[im 1/96]
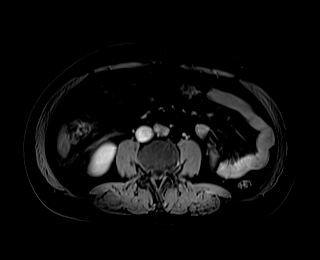
[im 48/96]
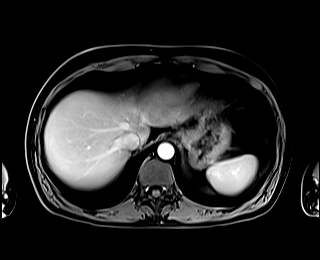
[im 96/96]
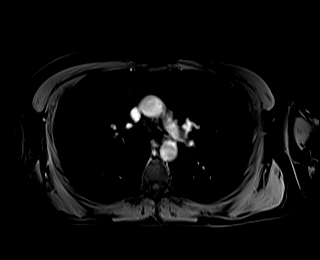

[Series 28: T1 dynamic · axial · 3.0mm · 1.25mm/px · z∈[-115,+170]mm · 3 of 96 slices shown (5 of 9)]
[im 1/96]
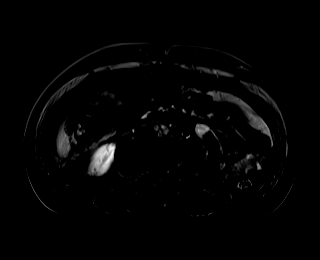
[im 48/96]
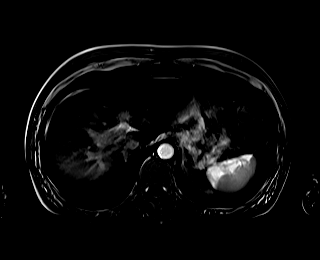
[im 96/96]
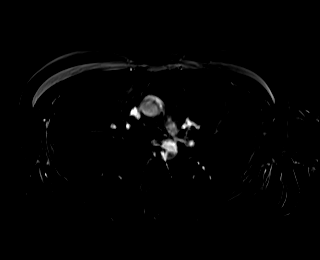

[Series 31: T1 dynamic · axial · 3.0mm · 1.25mm/px · z∈[-115,+170]mm · 3 of 96 slices shown (6 of 9)]
[im 1/96]
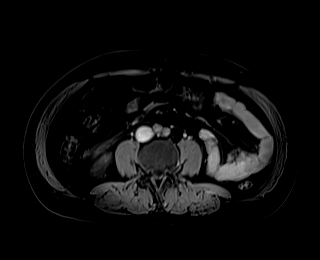
[im 48/96]
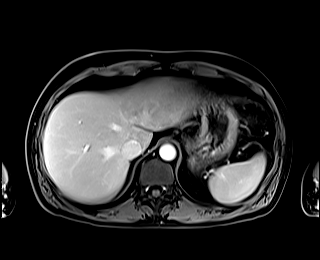
[im 96/96]
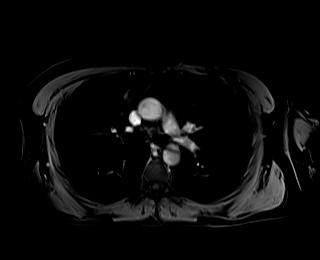

[Series 32: T1 dynamic · axial · 3.0mm · 1.25mm/px · z∈[-115,+170]mm · 3 of 96 slices shown (7 of 9)]
[im 1/96]
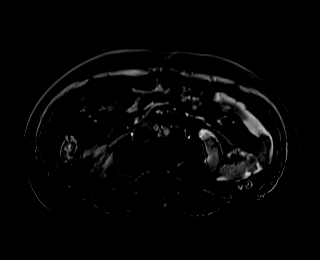
[im 48/96]
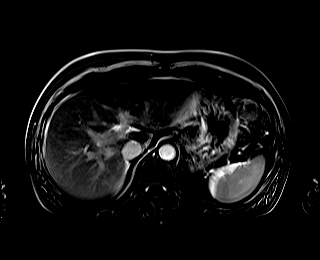
[im 96/96]
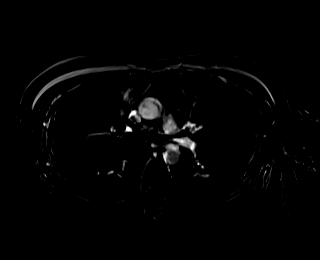

[Series 34: T1 dynamic · coronal · 5.0mm · 1.41mm/px · 2 of 48 slices shown (8 of 9)]
[im 1/48]
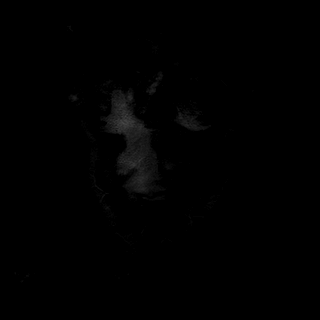
[im 48/48]
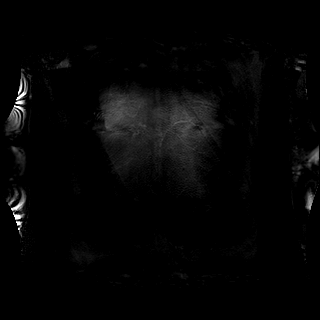

[Series 37: T1 dynamic · axial · 3.0mm · 1.25mm/px · z∈[-115,+26]mm · 2 of 96 slices shown (9 of 9)]
[im 1/96]
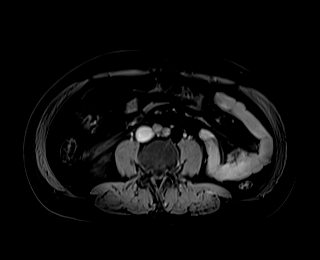
[im 48/96]
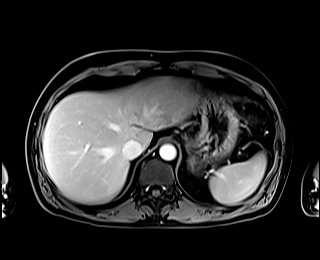

[41 of 48 positions shown; findings below may reference images not displayed]

FINDINGS: Lower chest: Unremarkable.

Hepatobiliary: Mild diffuse loss of signal intensity throughout the
hepatic parenchyma on out of phase dual echo images, indicative of a
background of mild hepatic steatosis. No suspicious cystic or solid
hepatic lesions. No intra or extrahepatic biliary ductal dilatation
noted on MRCP images. Common bile duct measures 4 mm in the porta
hepatis. No filling defect in the common bile duct on MRCP images to
suggest the presence of choledocholithiasis. Gallbladder is
unremarkable in appearance.

Pancreas: No pancreatic mass. No pancreatic ductal dilatation noted
on MRCP images. Increased T2 signal intensity surrounding the
pancreas, indicative of acute inflammation. Pancreatic parenchyma
appears to enhance normally. No well organized peripancreatic fluid
collection identified to suggest pancreatic pseudocyst at this time.

Spleen:  Unremarkable.

Adrenals/Urinary Tract: Bilateral kidneys and bilateral adrenal
glands are normal in appearance. No hydroureteronephrosis in the
visualized portions of the abdomen. Bilateral adrenal glands are
normal in appearance.

Stomach/Bowel: Visualized portions are unremarkable.

Vascular/Lymphatic: No aneurysm identified in the visualized
abdominal vasculature. Splenic and portal veins are patent. No
lymphadenopathy noted in the abdomen.

Other: Trace volume of ascites. T2 hyperintense fluid in the
retroperitoneum, most evident adjacent to the pancreas and tracking
into the root of the small bowel mesentery.

Musculoskeletal: No aggressive appearing lytic or blastic lesions
are noted in the visualized portions of the skeleton.
IMPRESSION: 1. Imaging findings are compatible with acute uncomplicated
pancreatitis, as detailed above.
2. Hepatic steatosis.
3. No choledocholithiasis or findings of biliary tract obstruction
or pancreatic duct obstruction.

## 2024-05-22 ENCOUNTER — Emergency Department (HOSPITAL_COMMUNITY)
Admission: EM | Admit: 2024-05-22 | Discharge: 2024-05-22 | Discharge status: Against Medical Advice | Attending: Emergency Medicine | Admitting: Emergency Medicine

## 2024-05-22 ENCOUNTER — Encounter (HOSPITAL_COMMUNITY): Payer: Self-pay

## 2024-05-22 DIAGNOSIS — R10A2 Flank pain, left side: Secondary | ICD-10-CM | POA: Insufficient documentation

## 2024-05-22 DIAGNOSIS — Z5321 Procedure and treatment not carried out due to patient leaving prior to being seen by health care provider: Secondary | ICD-10-CM | POA: Insufficient documentation

## 2024-05-22 NOTE — ED Triage Notes (Signed)
 Pt arrived via EMS, from home. C/o left sided flank pain.

## 2024-05-22 NOTE — ED Notes (Signed)
 Pt come to the desk, stated he drank some water and his pain went away, requesting to leave. AMA explained and he attempted x 2 on different sign pads (they do not work). Consuelo Petite, EMT witness to conversation.

## 2024-06-14 ENCOUNTER — Inpatient Hospital Stay (HOSPITAL_COMMUNITY)

## 2024-06-14 ENCOUNTER — Other Ambulatory Visit: Payer: Self-pay

## 2024-06-14 ENCOUNTER — Encounter (HOSPITAL_COMMUNITY): Payer: Self-pay | Admitting: Family Medicine

## 2024-06-14 ENCOUNTER — Inpatient Hospital Stay (HOSPITAL_COMMUNITY)
Admission: EM | Admit: 2024-06-14 | Discharge: 2024-06-19 | DRG: 871 | Disposition: A | Discharge status: Home / Self Care | Attending: Internal Medicine | Admitting: Internal Medicine

## 2024-06-14 ENCOUNTER — Emergency Department (HOSPITAL_COMMUNITY)

## 2024-06-14 DIAGNOSIS — R000 Tachycardia, unspecified: Secondary | ICD-10-CM | POA: Diagnosis not present

## 2024-06-14 DIAGNOSIS — R079 Chest pain, unspecified: Secondary | ICD-10-CM

## 2024-06-14 DIAGNOSIS — J942 Hemothorax: Secondary | ICD-10-CM | POA: Diagnosis present

## 2024-06-14 DIAGNOSIS — K7210 Chronic hepatic failure without coma: Secondary | ICD-10-CM | POA: Diagnosis present

## 2024-06-14 DIAGNOSIS — F4310 Post-traumatic stress disorder, unspecified: Secondary | ICD-10-CM | POA: Diagnosis present

## 2024-06-14 DIAGNOSIS — K7031 Alcoholic cirrhosis of liver with ascites: Secondary | ICD-10-CM | POA: Diagnosis present

## 2024-06-14 DIAGNOSIS — I959 Hypotension, unspecified: Secondary | ICD-10-CM | POA: Diagnosis present

## 2024-06-14 DIAGNOSIS — E7220 Disorder of urea cycle metabolism, unspecified: Secondary | ICD-10-CM | POA: Diagnosis present

## 2024-06-14 DIAGNOSIS — Z79899 Other long term (current) drug therapy: Secondary | ICD-10-CM | POA: Diagnosis not present

## 2024-06-14 DIAGNOSIS — D6959 Other secondary thrombocytopenia: Secondary | ICD-10-CM | POA: Diagnosis present

## 2024-06-14 DIAGNOSIS — Z720 Tobacco use: Secondary | ICD-10-CM | POA: Diagnosis present

## 2024-06-14 DIAGNOSIS — Z716 Tobacco abuse counseling: Secondary | ICD-10-CM

## 2024-06-14 DIAGNOSIS — E876 Hypokalemia: Secondary | ICD-10-CM | POA: Diagnosis present

## 2024-06-14 DIAGNOSIS — E871 Hypo-osmolality and hyponatremia: Secondary | ICD-10-CM | POA: Diagnosis present

## 2024-06-14 DIAGNOSIS — R9431 Abnormal electrocardiogram [ECG] [EKG]: Secondary | ICD-10-CM | POA: Diagnosis present

## 2024-06-14 DIAGNOSIS — D649 Anemia, unspecified: Secondary | ICD-10-CM

## 2024-06-14 DIAGNOSIS — D62 Acute posthemorrhagic anemia: Secondary | ICD-10-CM | POA: Diagnosis present

## 2024-06-14 DIAGNOSIS — D696 Thrombocytopenia, unspecified: Secondary | ICD-10-CM | POA: Diagnosis present

## 2024-06-14 DIAGNOSIS — Z7189 Other specified counseling: Secondary | ICD-10-CM

## 2024-06-14 DIAGNOSIS — E872 Acidosis, unspecified: Secondary | ICD-10-CM | POA: Diagnosis present

## 2024-06-14 DIAGNOSIS — J9691 Respiratory failure, unspecified with hypoxia: Principal | ICD-10-CM | POA: Diagnosis present

## 2024-06-14 DIAGNOSIS — R54 Age-related physical debility: Secondary | ICD-10-CM | POA: Diagnosis present

## 2024-06-14 DIAGNOSIS — A4151 Sepsis due to Escherichia coli [E. coli]: Principal | ICD-10-CM | POA: Diagnosis present

## 2024-06-14 DIAGNOSIS — Z515 Encounter for palliative care: Secondary | ICD-10-CM

## 2024-06-14 DIAGNOSIS — F17210 Nicotine dependence, cigarettes, uncomplicated: Secondary | ICD-10-CM | POA: Diagnosis present

## 2024-06-14 DIAGNOSIS — R0902 Hypoxemia: Secondary | ICD-10-CM | POA: Diagnosis present

## 2024-06-14 DIAGNOSIS — R7401 Elevation of levels of liver transaminase levels: Secondary | ICD-10-CM | POA: Diagnosis present

## 2024-06-14 DIAGNOSIS — K860 Alcohol-induced chronic pancreatitis: Secondary | ICD-10-CM | POA: Diagnosis present

## 2024-06-14 DIAGNOSIS — I8510 Secondary esophageal varices without bleeding: Secondary | ICD-10-CM | POA: Diagnosis present

## 2024-06-14 DIAGNOSIS — J9601 Acute respiratory failure with hypoxia: Secondary | ICD-10-CM | POA: Diagnosis present

## 2024-06-14 DIAGNOSIS — I864 Gastric varices: Secondary | ICD-10-CM | POA: Diagnosis present

## 2024-06-14 DIAGNOSIS — I8500 Esophageal varices without bleeding: Secondary | ICD-10-CM | POA: Diagnosis present

## 2024-06-14 DIAGNOSIS — J918 Pleural effusion in other conditions classified elsewhere: Secondary | ICD-10-CM | POA: Diagnosis present

## 2024-06-14 DIAGNOSIS — Z66 Do not resuscitate: Secondary | ICD-10-CM | POA: Diagnosis present

## 2024-06-14 DIAGNOSIS — J90 Pleural effusion, not elsewhere classified: Secondary | ICD-10-CM | POA: Diagnosis not present

## 2024-06-14 DIAGNOSIS — E43 Unspecified severe protein-calorie malnutrition: Secondary | ICD-10-CM | POA: Diagnosis present

## 2024-06-14 DIAGNOSIS — D684 Acquired coagulation factor deficiency: Secondary | ICD-10-CM | POA: Diagnosis present

## 2024-06-14 DIAGNOSIS — R112 Nausea with vomiting, unspecified: Secondary | ICD-10-CM | POA: Diagnosis present

## 2024-06-14 DIAGNOSIS — F1020 Alcohol dependence, uncomplicated: Secondary | ICD-10-CM | POA: Diagnosis present

## 2024-06-14 DIAGNOSIS — K766 Portal hypertension: Secondary | ICD-10-CM | POA: Diagnosis present

## 2024-06-14 DIAGNOSIS — Z818 Family history of other mental and behavioral disorders: Secondary | ICD-10-CM

## 2024-06-14 DIAGNOSIS — D689 Coagulation defect, unspecified: Secondary | ICD-10-CM | POA: Diagnosis present

## 2024-06-14 DIAGNOSIS — K7682 Hepatic encephalopathy: Secondary | ICD-10-CM | POA: Diagnosis present

## 2024-06-14 DIAGNOSIS — Y903 Blood alcohol level of 60-79 mg/100 ml: Secondary | ICD-10-CM | POA: Diagnosis present

## 2024-06-14 DIAGNOSIS — R64 Cachexia: Secondary | ICD-10-CM | POA: Diagnosis present

## 2024-06-14 DIAGNOSIS — Z681 Body mass index (BMI) 19 or less, adult: Secondary | ICD-10-CM

## 2024-06-14 DIAGNOSIS — R161 Splenomegaly, not elsewhere classified: Secondary | ICD-10-CM | POA: Diagnosis present

## 2024-06-14 DIAGNOSIS — Z800 Family history of malignant neoplasm of digestive organs: Secondary | ICD-10-CM

## 2024-06-14 DIAGNOSIS — K7030 Alcoholic cirrhosis of liver without ascites: Secondary | ICD-10-CM | POA: Diagnosis not present

## 2024-06-14 DIAGNOSIS — R4589 Other symptoms and signs involving emotional state: Secondary | ICD-10-CM

## 2024-06-14 LAB — URINE DRUG SCREEN
Amphetamines: NEGATIVE
Barbiturates: NEGATIVE
Benzodiazepines: NEGATIVE
Cocaine: NEGATIVE
Fentanyl: NEGATIVE
Methadone Scn, Ur: NEGATIVE
Opiates: NEGATIVE
Tetrahydrocannabinol: NEGATIVE

## 2024-06-14 LAB — URINALYSIS, ROUTINE W REFLEX MICROSCOPIC
Glucose, UA: NEGATIVE mg/dL
Hgb urine dipstick: NEGATIVE
Ketones, ur: NEGATIVE mg/dL
Leukocytes,Ua: NEGATIVE
Nitrite: NEGATIVE
Protein, ur: NEGATIVE mg/dL
Specific Gravity, Urine: 1.018 (ref 1.005–1.030)
pH: 7 (ref 5.0–8.0)

## 2024-06-14 LAB — PREPARE RBC (CROSSMATCH)

## 2024-06-14 LAB — TROPONIN T, HIGH SENSITIVITY
Troponin T High Sensitivity: <15 ng/L (ref 0–19)
Troponin T High Sensitivity: <15 ng/L (ref 0–19)

## 2024-06-14 LAB — CBC WITH DIFFERENTIAL/PLATELET
Abs Immature Granulocytes: 0.09 K/uL — ABNORMAL HIGH (ref 0.00–0.07)
Basophils Absolute: 0.2 K/uL — ABNORMAL HIGH (ref 0.0–0.1)
Basophils Relative: 1 %
Eosinophils Absolute: 0 K/uL (ref 0.0–0.5)
Eosinophils Relative: 0 %
HCT: 19.5 % — ABNORMAL LOW (ref 39.0–52.0)
Hemoglobin: 6.7 g/dL — CL (ref 13.0–17.0)
Immature Granulocytes: 1 %
Lymphocytes Relative: 10 %
Lymphs Abs: 1.3 K/uL (ref 0.7–4.0)
MCH: 38.3 pg — ABNORMAL HIGH (ref 26.0–34.0)
MCHC: 34.4 g/dL (ref 30.0–36.0)
MCV: 111.4 fL — ABNORMAL HIGH (ref 80.0–100.0)
Monocytes Absolute: 1.1 K/uL — ABNORMAL HIGH (ref 0.1–1.0)
Monocytes Relative: 9 %
Neutro Abs: 10 K/uL — ABNORMAL HIGH (ref 1.7–7.7)
Neutrophils Relative %: 79 %
Platelets: 54 K/uL — ABNORMAL LOW (ref 150–400)
RBC: 1.75 MIL/uL — ABNORMAL LOW (ref 4.22–5.81)
RDW: 16.9 % — ABNORMAL HIGH (ref 11.5–15.5)
WBC: 12.6 K/uL — ABNORMAL HIGH (ref 4.0–10.5)
nRBC: 0.2 % (ref 0.0–0.2)

## 2024-06-14 LAB — COMPREHENSIVE METABOLIC PANEL WITH GFR
ALT: 28 U/L (ref 0–44)
AST: 125 U/L — ABNORMAL HIGH (ref 15–41)
Albumin: 2.9 g/dL — ABNORMAL LOW (ref 3.5–5.0)
Alkaline Phosphatase: 140 U/L — ABNORMAL HIGH (ref 38–126)
Anion gap: 13 (ref 5–15)
BUN: 8 mg/dL (ref 6–20)
CO2: 23 mmol/L (ref 22–32)
Calcium: 8.4 mg/dL — ABNORMAL LOW (ref 8.9–10.3)
Chloride: 98 mmol/L (ref 98–111)
Creatinine, Ser: 0.61 mg/dL (ref 0.61–1.24)
GFR, Estimated: >60 mL/min
Glucose, Bld: 121 mg/dL — ABNORMAL HIGH (ref 70–99)
Potassium: 4.3 mmol/L (ref 3.5–5.1)
Sodium: 134 mmol/L — ABNORMAL LOW (ref 135–145)
Total Bilirubin: 18.1 mg/dL (ref 0.0–1.2)
Total Protein: 5.9 g/dL — ABNORMAL LOW (ref 6.5–8.1)

## 2024-06-14 LAB — ETHANOL: Alcohol, Ethyl (B): 71 mg/dL — ABNORMAL HIGH

## 2024-06-14 LAB — I-STAT CG4 LACTIC ACID, ED: Lactic Acid, Venous: 2.6 mmol/L (ref 0.5–1.9)

## 2024-06-14 LAB — TSH: TSH: 1.2 u[IU]/mL (ref 0.350–4.500)

## 2024-06-14 LAB — PHOSPHORUS: Phosphorus: 3.7 mg/dL (ref 2.5–4.6)

## 2024-06-14 LAB — PROTIME-INR
INR: 2 — ABNORMAL HIGH (ref 0.8–1.2)
Prothrombin Time: 23.5 s — ABNORMAL HIGH (ref 11.4–15.2)

## 2024-06-14 LAB — HIV ANTIBODY (ROUTINE TESTING W REFLEX): HIV Screen 4th Generation wRfx: NONREACTIVE

## 2024-06-14 LAB — ABO/RH: ABO/RH(D): A POS

## 2024-06-14 LAB — AMMONIA: Ammonia: 38 umol/L — ABNORMAL HIGH (ref 9–35)

## 2024-06-14 LAB — LIPASE, BLOOD: Lipase: 92 U/L — ABNORMAL HIGH (ref 11–51)

## 2024-06-14 LAB — MAGNESIUM: Magnesium: 1.6 mg/dL — ABNORMAL LOW (ref 1.7–2.4)

## 2024-06-14 MED ORDER — SODIUM CHLORIDE 0.9 % IV SOLN: 250.0000 mL | INTRAVENOUS | Status: AC | PRN

## 2024-06-14 MED ORDER — LORAZEPAM 1 MG PO TABS: 0.0000 mg | ORAL_TABLET | Freq: Four times a day (QID) | ORAL | Status: DC

## 2024-06-14 MED ORDER — IBUPROFEN 200 MG PO TABS
400.0000 mg | ORAL_TABLET | Freq: Four times a day (QID) | ORAL | Status: DC | PRN
  Administered 2024-06-14 – 2024-06-17 (×2): 400 mg via ORAL
  Filled 2024-06-14 (×2): qty 2

## 2024-06-14 MED ORDER — PANTOPRAZOLE SODIUM 40 MG PO TBEC
40.0000 mg | DELAYED_RELEASE_TABLET | Freq: Every day | ORAL | Status: DC
  Administered 2024-06-14 – 2024-06-18 (×5): 40 mg via ORAL
  Filled 2024-06-14 (×5): qty 1

## 2024-06-14 MED ORDER — SODIUM CHLORIDE 0.9% FLUSH
3.0000 mL | INTRAVENOUS | Status: DC | PRN
  Administered 2024-06-15: 3 mL via INTRAVENOUS

## 2024-06-14 MED ORDER — SODIUM CHLORIDE 0.9% FLUSH
3.0000 mL | Freq: Two times a day (BID) | INTRAVENOUS | Status: DC
  Administered 2024-06-14 – 2024-06-19 (×9): 3 mL via INTRAVENOUS

## 2024-06-14 MED ORDER — THIAMINE MONONITRATE 100 MG PO TABS
100.0000 mg | ORAL_TABLET | Freq: Every day | ORAL | Status: DC
  Administered 2024-06-15 – 2024-06-19 (×4): 100 mg via ORAL
  Filled 2024-06-14 (×6): qty 1

## 2024-06-14 MED ORDER — IOHEXOL 300 MG/ML  SOLN
100.0000 mL | Freq: Once | INTRAMUSCULAR | Status: AC | PRN
  Administered 2024-06-14: 100 mL via INTRAVENOUS

## 2024-06-14 MED ORDER — LORAZEPAM 1 MG PO TABS
1.0000 mg | ORAL_TABLET | ORAL | Status: AC | PRN
  Administered 2024-06-15: 1 mg via ORAL
  Filled 2024-06-14: qty 1

## 2024-06-14 MED ORDER — FOLIC ACID 1 MG PO TABS
1.0000 mg | ORAL_TABLET | Freq: Every day | ORAL | Status: DC
  Administered 2024-06-14 – 2024-06-19 (×6): 1 mg via ORAL
  Filled 2024-06-14 (×6): qty 1

## 2024-06-14 MED ORDER — THIAMINE HCL 100 MG/ML IJ SOLN
100.0000 mg | Freq: Every day | INTRAMUSCULAR | Status: DC
  Administered 2024-06-17: 100 mg via INTRAVENOUS
  Filled 2024-06-14 (×3): qty 2

## 2024-06-14 MED ORDER — THIAMINE MONONITRATE 100 MG PO TABS: 100.0000 mg | ORAL_TABLET | Freq: Every day | ORAL | Status: DC

## 2024-06-14 MED ORDER — LACTULOSE 10 GM/15ML PO SOLN: 20.0000 g | Freq: Two times a day (BID) | ORAL | Status: DC | PRN

## 2024-06-14 MED ORDER — SODIUM CHLORIDE 0.9% IV SOLUTION: Freq: Once | INTRAVENOUS | Status: DC

## 2024-06-14 MED ORDER — ADULT MULTIVITAMIN W/MINERALS CH
1.0000 | ORAL_TABLET | Freq: Every day | ORAL | Status: DC
  Administered 2024-06-14 – 2024-06-19 (×6): 1 via ORAL
  Filled 2024-06-14 (×6): qty 1

## 2024-06-14 MED ORDER — SPIRONOLACTONE 25 MG PO TABS
25.0000 mg | ORAL_TABLET | Freq: Every day | ORAL | Status: DC
  Administered 2024-06-14: 25 mg via ORAL
  Filled 2024-06-14: qty 1

## 2024-06-14 MED ORDER — POLYETHYLENE GLYCOL 3350 17 G PO PACK: 17.0000 g | PACK | Freq: Every day | ORAL | Status: DC | PRN

## 2024-06-14 MED ORDER — PROMETHAZINE HCL 6.25 MG/5ML PO SOLN
12.5000 mg | Freq: Four times a day (QID) | ORAL | Status: DC | PRN
  Filled 2024-06-14: qty 10

## 2024-06-14 MED ORDER — THIAMINE HCL 100 MG/ML IJ SOLN
100.0000 mg | Freq: Every day | INTRAMUSCULAR | Status: DC
  Administered 2024-06-14: 100 mg via INTRAVENOUS
  Filled 2024-06-14: qty 2

## 2024-06-14 MED ORDER — LORAZEPAM 2 MG/ML IJ SOLN
1.0000 mg | INTRAMUSCULAR | Status: AC | PRN
  Administered 2024-06-14: 1 mg via INTRAVENOUS
  Filled 2024-06-14: qty 1

## 2024-06-14 MED ORDER — LORAZEPAM 2 MG/ML IJ SOLN: 0.0000 mg | Freq: Two times a day (BID) | INTRAMUSCULAR | Status: DC

## 2024-06-14 MED ORDER — OXYCODONE HCL 5 MG PO TABS
2.5000 mg | ORAL_TABLET | ORAL | Status: DC | PRN
  Administered 2024-06-14 – 2024-06-18 (×6): 2.5 mg via ORAL
  Filled 2024-06-14 (×6): qty 1

## 2024-06-14 MED ORDER — CARVEDILOL 3.125 MG PO TABS
3.1250 mg | ORAL_TABLET | Freq: Every day | ORAL | Status: DC
  Administered 2024-06-14: 3.125 mg via ORAL
  Filled 2024-06-14: qty 1

## 2024-06-14 MED ORDER — LORAZEPAM 2 MG/ML IJ SOLN
0.0000 mg | Freq: Four times a day (QID) | INTRAMUSCULAR | Status: DC
  Administered 2024-06-14: 2 mg via INTRAVENOUS
  Filled 2024-06-14: qty 1

## 2024-06-14 MED ORDER — POTASSIUM CHLORIDE CRYS ER 20 MEQ PO TBCR
20.0000 meq | EXTENDED_RELEASE_TABLET | Freq: Every day | ORAL | Status: DC
  Administered 2024-06-14 – 2024-06-19 (×6): 20 meq via ORAL
  Filled 2024-06-14 (×6): qty 1

## 2024-06-14 MED ORDER — LORAZEPAM 1 MG PO TABS: 0.0000 mg | ORAL_TABLET | Freq: Two times a day (BID) | ORAL | Status: DC

## 2024-06-14 MED ORDER — FUROSEMIDE 20 MG PO TABS
20.0000 mg | ORAL_TABLET | Freq: Every day | ORAL | Status: DC
  Administered 2024-06-14: 20 mg via ORAL
  Filled 2024-06-14 (×2): qty 1

## 2024-06-14 NOTE — Assessment & Plan Note (Signed)
 Likely secondary to large pleural effusion on the left.  There is mild mediastinal shift.  I have consult page into the pulmonary critical care

## 2024-06-14 NOTE — Assessment & Plan Note (Signed)
 INR is up to 2.0 with low platelets. Will likely need some FFP if undergoing any type of procedure.

## 2024-06-14 NOTE — Assessment & Plan Note (Signed)
 Overall stable. Avoid hepatotoxic agents

## 2024-06-14 NOTE — Assessment & Plan Note (Addendum)
 Ongoing the patient is in recovery but has not been to a meeting in a couple of months. CIWA protocol

## 2024-06-14 NOTE — Assessment & Plan Note (Signed)
 Continue beta-blockade

## 2024-06-14 NOTE — Assessment & Plan Note (Signed)
 Causing hypoxia Likely related to end-stage liver disease

## 2024-06-14 NOTE — Assessment & Plan Note (Signed)
 This is much different than previous elevations.

## 2024-06-14 NOTE — Assessment & Plan Note (Signed)
 Mental status is

## 2024-06-14 NOTE — Assessment & Plan Note (Signed)
"  INR is 2.0  "

## 2024-06-14 NOTE — Procedures (Signed)
 Thoracentesis  Procedure Note  Wendy Mikles  [id number]  1980-12-11  Date:06/14/2024  Time:10:44 PM   Provider Performing:Atthew Coutant C Claudene   Procedure: Thoracentesis with imaging guidance ([id number])  Indication(s) Pleural Effusion  Consent Risks of the procedure as well as the alternatives and risks of each were explained to the patient and/or caregiver.  Consent for the procedure was obtained and is signed in the bedside chart  Anesthesia Topical only with 1% lidocaine     Time Out Verified patient identification, verified procedure, site/side was marked, verified correct patient position, special equipment/implants available, medications/allergies/relevant history reviewed, required imaging and test results available.   Sterile Technique Maximal sterile technique including full sterile barrier drape, hand hygiene, sterile gown, sterile gloves, mask, hair covering, sterile ultrasound probe cover (if used).  Procedure Description Ultrasound was used to identify appropriate pleural anatomy for placement and overlying skin marked.  Area of drainage cleaned and draped in sterile fashion. Lidocaine  was used to anesthetize the skin and subcutaneous tissue.  2000 cc's of bloody appearing fluid was drained from the left pleural space. Catheter then removed and bandaid applied to site.   Complications/Tolerance None; patient tolerated the procedure well. Chest X-ray is ordered to confirm no post-procedural complication.   EBL Minimal   Specimen(s) Pleural fluid

## 2024-06-14 NOTE — Assessment & Plan Note (Signed)
 Offered patch

## 2024-06-14 NOTE — Assessment & Plan Note (Deleted)
 Likely secondary to large pleural effusion on the left.  There is mild mediastinal shift.  I have consult page into the pulmonary critical care

## 2024-06-14 NOTE — Assessment & Plan Note (Signed)
 Lipase is mildly elevated at 92.  The patient reports intermittent epigastric pain and intermittent nausea and vomiting Advance diet as tolerated IV fluid resuscitation

## 2024-06-14 NOTE — Assessment & Plan Note (Signed)
 Hgb 6.4 May need scope

## 2024-06-14 NOTE — Assessment & Plan Note (Signed)
 Related to cirrhosis, marked splenic enlargement.

## 2024-06-14 NOTE — ED Provider Notes (Signed)
 " [place] [facility] AT [facility] Provider Note   CSN: [id number] Arrival date & time: 06/14/24  1138     Patient presents with: Abdominal Pain, Jaundice, and Emesis   Jeffrey Mathis is a 44 y.o. male.   HPI Has known history of hepatic failure and alcoholic cirrhosis.  Patient reports he has become much more jaundiced over the past several days.  He reports also for a number of days now he has been experiencing left-sided chest pain that radiates from his central chest to his back.  He reports he feels short of breath with activity.  Patient denies any specific injury.  He reports he has had some cough but no fever.  Patient is treated through the [facility].  Denies being currently on any transplant list.  Reports he had quit drinking 6 years ago but then had a relapse in October but quit again.    Prior to Admission medications  Medication Sig Start Date End Date Taking? Authorizing Provider  ibuprofen  (ADVIL ) 200 MG tablet Take 400 mg by mouth as needed for headache.    [provider]  ondansetron  (ZOFRAN -ODT) 4 MG disintegrating tablet Take 1 tablet (4 mg total) by mouth every 8 (eight) hours as needed for nausea or vomiting. 04/30/23   Smoot, Lauraine LABOR, PA-C  oxyCODONE -acetaminophen  (PERCOCET/ROXICET) 5-325 MG tablet Take 1 tablet by mouth every 6 (six) hours as needed for severe pain (pain score 7-10). 04/30/23   Smoot, Lauraine LABOR, PA-C  potassium chloride  20 MEQ TBCR Take 1 tablet (20 mEq total) by mouth daily for 15 days. 10/31/23 11/15/23  Cottie Donnice PARAS, MD    Allergies: Patient has no known allergies.    Review of Systems  Updated Vital Signs BP (!) 141/78   Pulse 91   Temp 98.4 F (36.9 C) (Oral)   Resp 20   SpO2 93%   Physical Exam Constitutional:      Comments: Alert with clear mental status.  Extremely jaundiced in appearance.  Chronically ill appearance.  Clear mental status.  No respiratory distress at rest.  HENT:     Head:  Normocephalic and atraumatic.     Mouth/Throat:     Pharynx: Oropharynx is clear.  Eyes:     General: Scleral icterus present.  Cardiovascular:     Rate and Rhythm: Normal rate and regular rhythm.  Pulmonary:     Comments: Respiratory distress.  Significantly diminished breath sounds on the left side.  Breath sounds clear on the right. Abdominal:     Comments: Mild abdominal distention but not significantly taut or protuberant.  Mild diffuse tenderness to palpation no guarding.  Musculoskeletal:     Comments: About 1+ edema of the lower extremities.  Calves are soft and pliable.  Skin:    General: Skin is warm and dry.     Comments: Significantly jaundiced down to the lower abdomen.  Neurological:     General: No focal deficit present.     Comments: Patient is alert.  He does not show any signs of somnolence or confusion.  He is slightly tremulous.  All movements are coordinated.  Psychiatric:        Mood and Affect: Mood normal.     (all labs ordered are listed, but only abnormal results are displayed) Labs Reviewed  COMPREHENSIVE METABOLIC PANEL WITH GFR - Abnormal; Notable for the following components:      Result Value   Sodium 134 (*)    Glucose, Bld 121 (*)  Calcium 8.4 (*)    Total Protein 5.9 (*)    Albumin  2.9 (*)    AST 125 (*)    Alkaline Phosphatase 140 (*)    Total Bilirubin 18.1 (*)    All other components within normal limits  ETHANOL - Abnormal; Notable for the following components:   Alcohol, Ethyl (B) 71 (*)    All other components within normal limits  LIPASE, BLOOD - Abnormal; Notable for the following components:   Lipase 92 (*)    All other components within normal limits  CBC WITH DIFFERENTIAL/PLATELET - Abnormal; Notable for the following components:   WBC 12.6 (*)    RBC 1.75 (*)    Hemoglobin 6.7 (*)    HCT 19.5 (*)    MCV 111.4 (*)    MCH 38.3 (*)    RDW 16.9 (*)    Platelets 54 (*)    Neutro Abs 10.0 (*)    Monocytes Absolute 1.1 (*)     Basophils Absolute 0.2 (*)    Abs Immature Granulocytes 0.09 (*)    All other components within normal limits  AMMONIA - Abnormal; Notable for the following components:   Ammonia 38 (*)    All other components within normal limits  MAGNESIUM  - Abnormal; Notable for the following components:   Magnesium  1.6 (*)    All other components within normal limits  PROTIME-INR - Abnormal; Notable for the following components:   Prothrombin Time 23.5 (*)    INR 2.0 (*)    All other components within normal limits  I-STAT CG4 LACTIC ACID, ED - Abnormal; Notable for the following components:   Lactic Acid, Venous 2.6 (*)    All other components within normal limits  CULTURE, BLOOD (ROUTINE X 2)  CULTURE, BLOOD (ROUTINE X 2)  PHOSPHORUS  URINALYSIS, ROUTINE W REFLEX MICROSCOPIC  URINE DRUG SCREEN  I-STAT CG4 LACTIC ACID, ED  TYPE AND SCREEN  ABO/RH  PREPARE RBC (CROSSMATCH)  TROPONIN T, HIGH SENSITIVITY  TROPONIN T, HIGH SENSITIVITY    EKG: EKG Interpretation Date/Time:  Sunday June 14 2024 16:15:16 EST Ventricular Rate:  104 PR Interval:  126 QRS Duration:  75 QT Interval:  368 QTC Calculation: 484 R Axis:   49  Text Interpretation: Sinus tachycardia Borderline prolonged QT interval Confirmed by Jerral Meth ([id number]) on 06/15/2024 5:50:59 PM  Radiology: [facility] Chest Port 1 View Result Date: 06/14/2024 CLINICAL DATA:  Left-sided chest pain. EXAM: PORTABLE CHEST 1 VIEW COMPARISON:  June 10, 2022 FINDINGS: The heart size and mediastinal contours are within normal limits. There is a large left-sided pleural effusion. Marked severity underlying airspace disease is suspected within the mid right lung and left lung base. The right lung is clear. No pneumothorax is identified. The visualized skeletal structures are unremarkable. IMPRESSION: Large left-sided pleural effusion with marked severity underlying airspace disease suspected within the mid right lung and left lung base.  Correlation with nonemergent chest CT is recommended to further exclude the presence of an underlying neoplastic process. Electronically Signed   By: Suzen Dials M.D.   On: 06/14/2024 13:43     Procedures  CRITICAL CARE Performed by: Ludivina Shines   Total critical care time: 30 minutes  Critical care time was exclusive of separately billable procedures and treating other patients.  Critical care was necessary to treat or prevent imminent or life-threatening deterioration.  Critical care was time spent personally by me on the following activities: development of treatment plan with patient and/or surrogate as well  as nursing, discussions with consultants, evaluation of patient's response to treatment, examination of patient, obtaining history from patient or surrogate, ordering and performing treatments and interventions, ordering and review of laboratory studies, ordering and review of radiographic studies, pulse oximetry and re-evaluation of patient's condition.  Medications Ordered in the ED  0.9 %  sodium chloride  infusion (Manually program via Guardrails IV Fluids) (0 mLs Intravenous Hold 06/14/24 1500)  LORazepam  (ATIVAN ) injection 0-4 mg (has no administration in time range)    Or  LORazepam  (ATIVAN ) tablet 0-4 mg (has no administration in time range)  LORazepam  (ATIVAN ) injection 0-4 mg (has no administration in time range)    Or  LORazepam  (ATIVAN ) tablet 0-4 mg (has no administration in time range)  thiamine  (VITAMIN B1) tablet 100 mg (has no administration in time range)    Or  thiamine  (VITAMIN B1) injection 100 mg (has no administration in time range)                                    Medical Decision Making Amount and/or Complexity of Data Reviewed Labs: ordered. Radiology: ordered.  Risk OTC drugs. Prescription drug management. Decision regarding hospitalization.   Presents as outlined.  He has known severe cirrhosis.  He describes worsening symptoms  over the past several days.  He does report being chronically jaundiced but that has worsened.  Also he has noted this left-sided chest pain.  Will proceed with chest x-ray and diagnostic studies.  Patient is afebrile.  He is nontoxic.  No hypotension or tachycardia.  Patient abdomen soft.  At this time I have low suspicion for SBP.  All diagnosis includes pleural effusion\pneumothorax\pneumonia\ACS\PE.  Clinically he does not have acute respiratory distress will continue to monitor closely and proceed with diagnostic evaluation.  Portable chest x-ray reviewed by myself shows large effusion on the left.  Radiology review for large effusion and possible underlying lung disease with recommendation for nonemergent CT.  GFR greater than 60 total bili 18 count 12.6 hemoglobin 6.7 platelets 54 ammonia 38 ethanol 71 mg/dL.  The patient denied alcohol use but at this time he does have still positive alcohol level of 71 mg/dL.SABRA  Patient is slightly tremulous although not confused or agitated.  Will start CIWA protocol.  Patient has significant anemia at 6.7 with low platelets.  Will initiate transfusion of 2 units PRBC.  Patient has large left pleural effusion.  This will need further diagnostic evaluation.  Clinically speaking patient does not exhibit respiratory distress at rest but was hypoxic on EMS arrival at 83%.  He is high 90s on 2 L.  Patient will require admission for further treatment.  Will add CT imaging for additional diagnostic information regarding any underlying process in association with unilateral pleural effusion.  Patient is not on any transplant list.  Reports he is being seen at the [facility] but has had limited GI treatment.  I do see on records from the [facility] that he did have a positive alcohol test in September.  It appears likely in the circumstance that patient has had problems with intermittent ongoing alcohol use complicating management of cirrhosis.     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

## 2024-06-14 NOTE — Assessment & Plan Note (Signed)
 Markedly worsening with previous level around 11 about 6 months ago.

## 2024-06-14 NOTE — Assessment & Plan Note (Signed)
 Likely contributing to his anemia. Consult GI

## 2024-06-14 NOTE — Assessment & Plan Note (Signed)
 Getting 1 u PRBCs due to hemoglobin of 6.7 Pt. Reports

## 2024-06-14 NOTE — Assessment & Plan Note (Signed)
 Lipase is 92. Has no significant abdominal pain.

## 2024-06-14 NOTE — Assessment & Plan Note (Signed)
 Likely chronic given symptoms. Likely related to esophageal varices Hemoglobin 6.7 will transfuse 1 unit of PRBCs

## 2024-06-14 NOTE — Assessment & Plan Note (Signed)
 Likely related to end-stage liver disease with mild splenomegaly Platelets are currently above 50

## 2024-06-14 NOTE — H&P (Addendum)
 " History and Physical    Patient: Jeffrey Mathis FMW:[id number] DOB: 1980-10-23 DOA: 06/14/2024 DOS: the patient was seen and examined on 06/14/2024 PCP: [facility]  Patient coming from: Home  Chief Complaint:  Chief Complaint  Patient presents with   Abdominal Pain   Jaundice   Emesis   HPI: Jeffrey Mathis is a 44 y.o. 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  He comes in today reporting shortness of breath and increasing jaundice. In the ED he was noted to be hypoxic with oxygen saturations in the 80s which improved with oxygen.  He was noted to have a large pleural effusion on the left.  Additionally patient had a positive alcohol level, elevated lipase, elevated INR, elevated lactic acid, elevated ammonia, elevated LFTs with a markedly elevated T. bili and elevated lipase.  Patient was also noted to have a platelet count of 54 as well as hemoglobin of 6.7.  He was given 1 unit of packed red blood cells.  The patient notes black stools from time to time but no hematemesis or frank rectal bleeding.  Patient has known esophageal varices.  [facility] was discussion around [facility] however it appears he has a positive BAL here.  The wife reports 30 pound weight loss in the last 7 to 8 months which has mostly been muscle wasting. Review of Systems: As mentioned in the history of present illness. All other systems reviewed and are negative. Past Medical History:  Diagnosis Date   Pancreatitis    PTSD (post-traumatic stress disorder)    PTSD (post-traumatic stress disorder)    Past Surgical History:  Procedure Laterality Date   ABDOMINAL SURGERY     scrapnel   APPENDECTOMY     arm surgery     KNEE ARTHROSCOPY     R forearm surgery     Social History:  reports that he has been smoking cigarettes. He has never used smokeless tobacco. He  reports that he does not currently use alcohol. He reports current drug use.  Allergies[1]  Family History  Problem Relation Age of Onset   Cancer Mother    Bipolar disorder Mother    Cancer Father    Pancreatitis Father     Prior to Admission medications  Medication Sig Start Date End Date Taking? Authorizing Provider  ibuprofen  (ADVIL ) 200 MG tablet Take 400 mg by mouth as needed for headache.    [provider]  ondansetron  (ZOFRAN -ODT) 4 MG disintegrating tablet Take 1 tablet (4 mg total) by mouth every 8 (eight) hours as needed for nausea or vomiting. 04/30/23   Smoot, Lauraine LABOR, PA-C  oxyCODONE -acetaminophen  (PERCOCET/ROXICET) 5-325 MG tablet Take 1 tablet by mouth every 6 (six) hours as needed for severe pain (pain score 7-10). 04/30/23   Smoot, Lauraine LABOR, PA-C  potassium chloride  20 MEQ TBCR Take 1 tablet (20 mEq total) by mouth daily for 15 days. 10/31/23 11/15/23  Cottie Donnice PARAS, MD    Physical Exam: Vitals:   06/14/24 1300 06/14/24 1543 06/14/24 1600 06/14/24 1616  BP: (!) 141/78 126/71 127/73   Pulse: 91 95 96   Resp: 20  18   Temp:    99.3 F (37.4 C)  TempSrc:    Oral  SpO2: 93%  92%    Physical Examination: General appearance - chronically ill appearing Eyes -scleral icterus Chest -decreased on the left  Heart - normal rate, regular rhythm, normal S1, S2, no murmurs, rubs, clicks or gallops Abdomen -protuberant Extremities - pedal edema 1+  Data Reviewed: Results for orders placed or performed during the hospital encounter of 06/14/24 (from the past 24 hours)  ABO/Rh     Status: None   Collection Time: 06/14/24  1:25 PM  Result Value Ref Range   ABO/RH(D)      A POS Performed at [facility], [address], [place], [place] [address]   Comprehensive metabolic panel     Status: Abnormal   Collection Time: 06/14/24  1:35 PM  Result Value Ref Range   Sodium 134 (L) 135 - 145 mmol/L   Potassium 4.3 3.5 - 5.1 mmol/L   Chloride 98  98 - 111 mmol/L   CO2 23 22 - 32 mmol/L   Glucose, Bld 121 (H) 70 - 99 mg/dL   BUN 8 6 - 20 mg/dL   Creatinine, Ser 9.38 0.61 - 1.24 mg/dL   Calcium 8.4 (L) 8.9 - 10.3 mg/dL   Total Protein 5.9 (L) 6.5 - 8.1 g/dL   Albumin  2.9 (L) 3.5 - 5.0 g/dL   AST 874 (H) 15 - 41 U/L   ALT 28 0 - 44 U/L   Alkaline Phosphatase 140 (H) 38 - 126 U/L   Total Bilirubin 18.1 (HH) 0.0 - 1.2 mg/dL   GFR, Estimated >39 >39 mL/min   Anion gap 13 5 - 15  Ethanol     Status: Abnormal   Collection Time: 06/14/24  1:35 PM  Result Value Ref Range   Alcohol, Ethyl (B) 71 (H) <15 mg/dL  Lipase, blood     Status: Abnormal   Collection Time: 06/14/24  1:35 PM  Result Value Ref Range   Lipase 92 (H) 11 - 51 U/L  Troponin T, High Sensitivity     Status: None   Collection Time: 06/14/24  1:35 PM  Result Value Ref Range   Troponin T High Sensitivity <15 0 - 19 ng/L  CBC with Differential     Status: Abnormal   Collection Time: 06/14/24  1:35 PM  Result Value Ref Range   WBC 12.6 (H) 4.0 - 10.5 K/uL   RBC 1.75 (L) 4.22 - 5.81 MIL/uL   Hemoglobin 6.7 (LL) 13.0 - 17.0 g/dL   HCT 80.4 (L) 60.9 - 47.9 %   MCV 111.4 (H) 80.0 - 100.0 fL   MCH 38.3 (H) 26.0 - 34.0 pg   MCHC 34.4 30.0 - 36.0 g/dL   RDW 83.0 (H) 88.4 - 84.4 %   Platelets 54 (L) 150 - 400 K/uL   nRBC 0.2 0.0 - 0.2 %   Neutrophils Relative % 79 %   Neutro Abs 10.0 (H) 1.7 - 7.7 K/uL   Lymphocytes Relative 10 %   Lymphs Abs 1.3 0.7 - 4.0 K/uL   Monocytes Relative 9 %   Monocytes Absolute 1.1 (H) 0.1 - 1.0 K/uL   Eosinophils Relative 0 %   Eosinophils Absolute 0.0 0.0 - 0.5 K/uL   Basophils Relative 1 %   Basophils Absolute 0.2 (H) 0.0 - 0.1 K/uL   Immature Granulocytes 1 %   Abs Immature Granulocytes 0.09 (H) 0.00 - 0.07 K/uL  Type and screen [facility]     Status: None (Preliminary result)   Collection Time: 06/14/24  1:35 PM  Result Value Ref Range   ABO/RH(D) A POS    Antibody Screen NEG    Sample Expiration  06/17/2024,2359    Unit Number [id number]    Blood Component Type RED CELLS,LR    Unit division 00    Status of Unit ALLOCATED    Transfusion Status OK TO TRANSFUSE    Crossmatch Result      Compatible Performed at [facility], [address] [place], [place] [address]    Unit Number [id number]    Blood Component Type RED CELLS,LR    Unit division 00    Status of Unit ALLOCATED    Transfusion Status OK TO TRANSFUSE    Crossmatch Result Compatible   Ammonia     Status: Abnormal   Collection Time: 06/14/24  1:35 PM  Result Value Ref Range   Ammonia 38 (H) 9 - 35 umol/L  Magnesium      Status: Abnormal   Collection Time: 06/14/24  1:35 PM  Result Value Ref Range   Magnesium  1.6 (L) 1.7 - 2.4 mg/dL  Phosphorus     Status: None   Collection Time: 06/14/24  1:35 PM  Result Value Ref Range   Phosphorus 3.7 2.5 - 4.6 mg/dL  I-Stat Lactic Acid     Status: Abnormal   Collection Time: 06/14/24  1:48 PM  Result Value Ref Range   Lactic Acid, Venous 2.6 (HH) 0.5 - 1.9 mmol/L   Comment NOTIFIED PHYSICIAN   Protime-INR     Status: Abnormal   Collection Time: 06/14/24  2:26 PM  Result Value Ref Range   Prothrombin Time 23.5 (H) 11.4 - 15.2 seconds   INR 2.0 (H) 0.8 - 1.2  Prepare RBC (crossmatch)     Status: None   Collection Time: 06/14/24  2:51 PM  Result Value Ref Range   Order Confirmation      ORDER PROCESSED BY BLOOD BANK Performed at [facility], [address] [place], [place] [address]   Troponin T, High Sensitivity     Status: None   Collection Time: 06/14/24  4:14 PM  Result Value Ref Range   Troponin T High Sensitivity <15 0 - 19 ng/L  Urinalysis, Routine w reflex microscopic -Urine, Clean Catch     Status: Abnormal   Collection Time: 06/14/24  4:15 PM  Result Value Ref Range   Color, Urine AMBER (A) YELLOW   APPearance HAZY (A) CLEAR   Specific Gravity, Urine 1.018 1.005 - 1.030   pH 7.0 5.0 - 8.0   Glucose, UA  NEGATIVE NEGATIVE mg/dL   Hgb urine dipstick NEGATIVE NEGATIVE   Bilirubin Urine MODERATE (A) NEGATIVE   Ketones, ur NEGATIVE NEGATIVE mg/dL   Protein, ur NEGATIVE NEGATIVE mg/dL   Nitrite NEGATIVE NEGATIVE   Leukocytes,Ua NEGATIVE NEGATIVE  Urine rapid drug screen (hosp performed)     Status: None   Collection Time: 06/14/24  4:15 PM  Result Value Ref Range   Opiates NEGATIVE NEGATIVE   Cocaine NEGATIVE NEGATIVE   Benzodiazepines NEGATIVE NEGATIVE   Amphetamines NEGATIVE NEGATIVE   Tetrahydrocannabinol NEGATIVE NEGATIVE   Barbiturates NEGATIVE NEGATIVE   Methadone Scn, Ur NEGATIVE NEGATIVE   Fentanyl NEGATIVE NEGATIVE  TSH     Status: None   Collection Time: 06/14/24  7:02 PM  Result Value Ref Range   TSH 1.200 0.350 - 4.500 uIU/mL   CT CHEST ABDOMEN PELVIS W CONTRAST Result Date: 06/14/2024 EXAM: CT CHEST, ABDOMEN AND PELVIS WITH CONTRAST 06/14/2024 04:52:22 PM TECHNIQUE: CT of the chest, abdomen and pelvis was performed with the administration of 100 mL of iohexol  (OMNIPAQUE ) 300 MG/ML solution.  Multiplanar reformatted images are provided for review. Automated exposure control, iterative reconstruction, and/or weight based adjustment of the mA/kV was utilized to reduce the radiation dose to as low as reasonably achievable. 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 Left upper quadrant abdominal pain, chest pain, dyspnea, hypoxia, jaundice. FINDINGS: CHEST: MEDIASTINUM AND LYMPH NODES: Heart and pericardium are unremarkable. The central airways are clear. No mediastinal, hilar or axillary lymphadenopathy. 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 Mild mediastinal shift to the right. LUNGS AND PLEURA: Large left pleural effusion is present with near complete collapse of the left lung. Mild right basilar dependent atelectasis. Mild emphysema. No pneumothorax. No pleural effusion on the right.  ABDOMEN AND PELVIS: LIVER: The portal vein is patent. The hepatic artery is hypertrophied in keeping with chronic changes of underlying cirrhosis. As noted above, extensive gastroesophageal varices were identified in keeping with changes related to portal venous hypertension. GALLBLADDER AND BILE DUCTS: Punctate mural calcification of the gallbladder is nonspecific. Gallbladder wall thickening is nonspecific in the setting of cirrhosis and ascites. No biliary ductal dilatation. SPLEEN: Small splenomegaly is present with the spleen measuring 14.6 cm in greatest dimension. No intrasplenic lesions identified. PANCREAS: No acute abnormality. ADRENAL GLANDS: No acute abnormality. KIDNEYS, URETERS AND BLADDER: No stones in the kidneys or ureters. No hydronephrosis. No perinephric or periureteral stranding. Urinary bladder is unremarkable. GI AND BOWEL: Stomach demonstrates no acute abnormality. There is circumferential marked wall thickening and submucosal edema involving the cecum and ascending colon which may reflect changes of portal colopathy. Note that infectious or inflammatory colitis could appear similarly, however. There is no bowel obstruction. REPRODUCTIVE ORGANS: No acute abnormality. PERITONEUM AND RETROPERITONEUM: Mild ascites. No free air. VASCULATURE: Aorta is normal in caliber. ABDOMINAL AND PELVIS LYMPH NODES: No lymphadenopathy. BONES AND SOFT TISSUES: No acute osseous abnormality. No focal soft tissue abnormality. IMPRESSION: 1. Large left pleural effusion with near complete collapse of the left lung and mild mediastinal shift to the right. 2. Cirrhosis with portal hypertension manifested by extensive gastroesophageal varices, mild splenomegaly, mild ascites, and hypertrophied hepatic artery. 3. Circumferential marked wall thickening and submucosal edema involving the cecum and ascending colon, which may reflect portal colopathy, with infectious or inflammatory colitis in the differential. 4. Raf score  includes emphysema ([id number]). Electronically signed by: Dorethia Molt MD MD 06/14/2024 05:24 PM EST RP Workstation: [id number]   DG Chest Port 1 View Result Date: 06/14/2024 CLINICAL DATA:  Left-sided chest pain. EXAM: PORTABLE CHEST 1 VIEW COMPARISON:  June 10, 2022 FINDINGS: The heart size and mediastinal contours are within normal limits. There is a large left-sided pleural effusion. Marked severity underlying airspace disease is suspected within the mid right lung and left lung base. The right lung is clear. No pneumothorax is identified. The visualized skeletal structures are unremarkable. IMPRESSION: Large left-sided pleural effusion with marked severity underlying airspace disease suspected within the mid right lung and left lung base. Correlation with nonemergent chest CT is recommended to further exclude the presence of an underlying neoplastic process. Electronically Signed   By: Suzen Dials M.D.   On: 06/14/2024 13:43     Assessment and Plan: * Hypoxic respiratory failure (HCC) Likely secondary to large pleural effusion on the left.  There is mild mediastinal shift.  I have consult page into the pulmonary critical care  Chronic alcoholic pancreatitis (HCC) Lipase is mildly elevated at 92.  The patient reports intermittent epigastric pain and intermittent nausea and vomiting Advance  diet as tolerated IV fluid resuscitation  Alcohol use disorder, severe, dependence (HCC) Ongoing the patient is in recovery but has not been to a meeting in a couple of months. CIWA protocol  Prolonged QT interval Avoid QT prolonging agents  Anemia Likely chronic given symptoms. Likely related to esophageal varices Hemoglobin 6.7 will transfuse 1 unit of PRBCs  Pleural effusion Causing hypoxia Likely related to end-stage liver disease  Hyperammonemia Resume lactulose  which he takes intermittently at home According to his wife he does have some mild confusion though the patient  denies this  Thrombocytopenia Likely related to end-stage liver disease with mild splenomegaly Platelets are currently above 50  Alcoholic cirrhosis of liver with ascites (HCC) Decompensated MELD score is 25 Childs class C GI consultation  Coagulopathy INR is up to 2.0 with low platelets. Will likely need some FFP if undergoing any type of procedure.  Portal hypertension (HCC) Continue beta-blockade  Esophageal varices (HCC) Likely contributing to his anemia. Consult GI  Hyperbilirubinemia This is much different than previous elevations.  Transaminitis His LFTs appear stable.  He does have a worse elevation of his AST than his ALT consistent with alcoholic liver disease  Tobacco use Patient offered and declined nicotine patch      Advance Care Planning:   Code Status: Full Code   Consults: [facility] critical care  Family Communication: Wife at bedside  Severity of Illness: The appropriate patient status for this patient is INPATIENT. Inpatient status is judged to be reasonable and necessary in order to provide the required intensity of service to ensure the patient's safety. The patient's presenting symptoms, physical exam findings, and initial radiographic and laboratory data in the context of their chronic comorbidities is felt to place them at high risk for further clinical deterioration. Furthermore, it is not anticipated that the patient will be medically stable for discharge from the hospital within 2 midnights of admission.   * I certify that at the point of admission it is my clinical judgment that the patient will require inpatient hospital care spanning beyond 2 midnights from the point of admission due to high intensity of service, high risk for further deterioration and high frequency of surveillance required.*  Author: Glenys GORMAN Birk, MD 06/14/2024 4:41 PM  For on call review [email or web address].      [1] No Known Allergies  "

## 2024-06-14 NOTE — Assessment & Plan Note (Signed)
 Avoid QT prolonging agents

## 2024-06-14 NOTE — Assessment & Plan Note (Signed)
 And MELD score of 25 Child Class C (Child-Pugh score 12) Consider palliative care consultation

## 2024-06-14 NOTE — Assessment & Plan Note (Signed)
 Begin diuresis Unclear role of drainage as this is likely to recur due to etiology

## 2024-06-14 NOTE — Assessment & Plan Note (Signed)
 Related to pleural effusion CT shows ? Related to ascites and liver failure

## 2024-06-14 NOTE — Assessment & Plan Note (Signed)
 Decompensated MELD score is 25 Childs class C GI consultation

## 2024-06-14 NOTE — Assessment & Plan Note (Signed)
Patient offered and declined nicotine patch

## 2024-06-14 NOTE — Assessment & Plan Note (Signed)
 His LFTs appear stable.  He does have a worse elevation of his AST than his ALT consistent with alcoholic liver disease

## 2024-06-14 NOTE — Assessment & Plan Note (Signed)
-  CIWA protocol

## 2024-06-14 NOTE — ED Triage Notes (Signed)
 Patient BIBA coming from home hx of liver disease c/o LUQ/CP/SOB, O2 83% RA, patient does not wear oxygen at hhome, currently on 6L [place], jaundice, abdominal distention, left lung sounds diminished HR 90 BP 130/70 CBG 121 18G left AC. Patient is alert and oriented x 4. Airway patent, respirations even and unlabored.

## 2024-06-14 NOTE — Assessment & Plan Note (Signed)
 Resume lactulose  which he takes intermittently at home According to his wife he does have some mild confusion though the patient denies this

## 2024-06-14 NOTE — Assessment & Plan Note (Signed)
 Likely related to lung process on the left with effusion CT shows Sats are in the 90s on 2L

## 2024-06-14 NOTE — Hospital Course (Addendum)
 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  He comes in today reporting shortness of breath and increasing jaundice. In the ED he was noted to be hypoxic with oxygen saturations in the 80s which improved with oxygen.  He was noted to have a large pleural effusion on the left.  Additionally patient had a positive alcohol level, elevated lipase, elevated INR, elevated lactic acid, elevated ammonia, elevated LFTs with a markedly elevated T. bili and elevated lipase.  Patient was also noted to have a platelet count of 54 as well as hemoglobin of 6.7.  He was given 1 unit of packed red blood cells.  The patient notes black stools from time to time but no hematemesis or frank rectal bleeding.  Patient has known esophageal varices.  [facility] there was discussion around transplant however it appears he has a positive BAL here.

## 2024-06-14 NOTE — ED Notes (Signed)
 Resent blue top lab said it hemolyzed

## 2024-06-14 NOTE — Consult Note (Signed)
 "  NAME:  Jeffrey Mathis, MRN:  [id number], DOB:  06-06-1980, LOS: 0 ADMISSION DATE:  06/14/2024, CONSULTATION DATE:  06/14/24 REFERRING MD:  Fredirick, CHIEF COMPLAINT:  SOB   History of Present Illness:  44 year old man with what appears to be end stage alcoholic liver cirrhosis followed at [facility] who presents with months of weight loss, weakness, and SOB.  Workup reveals AOC anemia, decompensated cirrhosis and large pleural effusion with tension features on imaging.  Pulmonary is consulted.  Patient is a poor historian so am relying on wife: she states breathing has been tough for him for very long time.  No recent trauma, no infectious symptoms.  No cough.  28 pack year smoker.  Pertinent  Medical History  ETOH cirrhosis  Significant Hospital Events: Including procedures, antibiotic start and stop dates in addition to other pertinent events   1/11 admit, thora  Interim History / Subjective:  consult  Objective    Blood pressure 137/69, pulse (!) 120, temperature (!) 100.9 F (38.3 C), temperature source Oral, resp. rate 18, height 6' 1 (1.854 m), weight 62.6 kg, SpO2 96%.       No intake or output data in the 24 hours ending 06/14/24 2244 Filed Weights   06/14/24 1813  Weight: 62.6 kg    Examination: General: ill appearing cachetic jaundiced man HENT: temporal wasting, scleral icterus, tracks Lungs: absent on left, large effusion simple appearing Cardiovascular: regular, tachy, HSM over apex Abdomen: soft, +BS Extremities: muscle wasting Neuro: cannot transfer without assistance, profoundly weak Skin: multiple bruises  Hgb 6.7, plts 54, bili 18, ammonia okay  Resolved problem list   Assessment and Plan  Large left pleural effusion- present but smaller a few months ago; now with tachycardia reasonable to unload in case causing tension physiology and should help breathing.  I had assumed this would be hepatic hydrothorax but old blood drained from space indicating possible  trauma component vs. Malignancy.  Suppose could be spontaneous hemorrhage from hepatic coagulopathy but would be unusual.  Regardless, send for standard studies.  Still suspect needs EGD if he has not already had one recently.  His imaging abnormalities of colon are chronic and likely related to portal hypertension as well.  F/u pleural fluid studies Wean o2 for sats > 90% Further management of effusion will depend on cytology and rapidity of reaccumulation His clinical appearance and recent hx is very concerning for someone at EOL   Labs   CBC: Recent Labs  Lab 06/14/24 1335  WBC 12.6*  NEUTROABS 10.0*  HGB 6.7*  HCT 19.5*  MCV 111.4*  PLT 54*    Basic Metabolic Panel: Recent Labs  Lab 06/14/24 1335  NA 134*  K 4.3  CL 98  CO2 23  GLUCOSE 121*  BUN 8  CREATININE 0.61  CALCIUM 8.4*  MG 1.6*  PHOS 3.7   GFR: Estimated Creatinine Clearance: 107.6 mL/min (by C-G formula based on SCr of 0.61 mg/dL). Recent Labs  Lab 06/14/24 1335 06/14/24 1348  WBC 12.6*  --   LATICACIDVEN  --  2.6*    Liver Function Tests: Recent Labs  Lab 06/14/24 1335  AST 125*  ALT 28  ALKPHOS 140*  BILITOT 18.1*  PROT 5.9*  ALBUMIN  2.9*   Recent Labs  Lab 06/14/24 1335  LIPASE 92*   Recent Labs  Lab 06/14/24 1335  AMMONIA 38*    ABG No results found for: PHART, PCO2ART, PO2ART, HCO3, TCO2, ACIDBASEDEF, O2SAT   Coagulation Profile: Recent Labs  Lab 06/14/24 1426  INR 2.0*    Cardiac Enzymes: No results for input(s): CKTOTAL, CKMB, CKMBINDEX, TROPONINI in the last 168 hours.  HbA1C: No results found for: HGBA1C  CBG: No results for input(s): GLUCAP in the last 168 hours.  Review of Systems:    Positive Symptoms in bold:  Constitutional fevers, chills, weight loss, fatigue, anorexia, malaise  Eyes decreased vision, double vision, eye irritation  Ears, Nose, Mouth, Throat sore throat, trouble swallowing, sinus congestion   Cardiovascular chest pain, paroxysmal nocturnal dyspnea, lower ext edema, palpitations   Respiratory SOB, cough, DOE, hemoptysis, wheezing  Gastrointestinal nausea, vomiting, diarrhea  Genitourinary burning with urination, trouble urinating  Musculoskeletal joint aches, joint swelling, back pain  Integumentary  rashes, skin lesions  Neurological focal weakness, focal numbness, trouble speaking, headaches  Psychiatric depression, anxiety, confusion  Endocrine polyuria, polydipsia, cold intolerance, heat intolerance  Hematologic abnormal bruising, abnormal bleeding, unexplained nose bleeds  Allergic/Immunologic recurrent infections, hives, swollen lymph nodes     Past Medical History:  He,  has a past medical history of Pancreatitis, PTSD (post-traumatic stress disorder), and PTSD (post-traumatic stress disorder).   Surgical History:   Past Surgical History:  Procedure Laterality Date   ABDOMINAL SURGERY     scrapnel   APPENDECTOMY     arm surgery     KNEE ARTHROSCOPY     R forearm surgery       Social History:   reports that he has been smoking cigarettes. He has never used smokeless tobacco. He reports that he does not currently use alcohol. He reports current drug use.   Family History:  His family history includes Bipolar disorder in his mother; Cancer in his father and mother; Pancreatitis in his father.   Allergies Allergies[1]   Home Medications  Prior to Admission medications  Medication Sig Start Date End Date Taking? Authorizing Provider  carvedilol  (COREG ) 6.25 MG tablet Take 3.125 mg by mouth at bedtime.   Yes [provider]  lactulose  (CHRONULAC ) 10 GM/15ML solution Take 20 g by mouth 2 (two) times daily as needed (to lower ammonia levels in the blood).   Yes [provider]  omeprazole (PRILOSEC) 40 MG capsule Take 40 mg by mouth at bedtime.   Yes [provider]  OVER THE COUNTER MEDICATION Place 1-2 drops into both eyes See  admin instructions. Real Tears eye drops - Instill 1-2 drops into both eyes three times a day as needed for dryness   Yes [provider]  spironolactone  (ALDACTONE ) 25 MG tablet Take 25 mg by mouth at bedtime.   Yes [provider]  ondansetron  (ZOFRAN -ODT) 4 MG disintegrating tablet Take 1 tablet (4 mg total) by mouth every 8 (eight) hours as needed for nausea or vomiting. Patient not taking: Reported on 06/14/2024 04/30/23   Smoot, Lauraine LABOR, PA-C  oxyCODONE -acetaminophen  (PERCOCET/ROXICET) 5-325 MG tablet Take 1 tablet by mouth every 6 (six) hours as needed for severe pain (pain score 7-10). Patient not taking: Reported on 06/14/2024 04/30/23   Smoot, Lauraine LABOR, PA-C  potassium chloride  20 MEQ TBCR Take 1 tablet (20 mEq total) by mouth daily for 15 days. Patient not taking: Reported on 06/14/2024 10/31/23 06/14/24  Cottie Donnice PARAS, MD     Critical care time: N/A              [1] No Known Allergies  "

## 2024-06-15 ENCOUNTER — Inpatient Hospital Stay (HOSPITAL_COMMUNITY)

## 2024-06-15 DIAGNOSIS — K766 Portal hypertension: Secondary | ICD-10-CM

## 2024-06-15 DIAGNOSIS — A4151 Sepsis due to Escherichia coli [E. coli]: Principal | ICD-10-CM

## 2024-06-15 DIAGNOSIS — K7030 Alcoholic cirrhosis of liver without ascites: Secondary | ICD-10-CM

## 2024-06-15 LAB — BLOOD CULTURE ID PANEL (REFLEXED) - BCID2

## 2024-06-15 LAB — URINALYSIS, ROUTINE W REFLEX MICROSCOPIC
Bacteria, UA: NONE SEEN
Glucose, UA: NEGATIVE mg/dL
Ketones, ur: NEGATIVE mg/dL
Leukocytes,Ua: NEGATIVE
Nitrite: NEGATIVE
Protein, ur: NEGATIVE mg/dL
Specific Gravity, Urine: 1.008 (ref 1.005–1.030)
pH: 6 (ref 5.0–8.0)

## 2024-06-15 LAB — OCCULT BLOOD X 1 CARD TO LAB, STOOL: Fecal Occult Bld: NEGATIVE

## 2024-06-15 LAB — BODY FLUID CELL COUNT WITH DIFFERENTIAL
Eos, Fluid: 4 %
Lymphs, Fluid: 10 %
Monocyte-Macrophage-Serous Fluid: 12 % — ABNORMAL LOW (ref 50–90)
Neutrophil Count, Fluid: 74 % — ABNORMAL HIGH (ref 0–25)
Total Nucleated Cell Count, Fluid: 5756 uL — ABNORMAL HIGH (ref 0–1000)

## 2024-06-15 LAB — CBC
HCT: 21.9 % — ABNORMAL LOW (ref 39.0–52.0)
Hemoglobin: 7.9 g/dL — ABNORMAL LOW (ref 13.0–17.0)
MCH: 36.2 pg — ABNORMAL HIGH (ref 26.0–34.0)
MCHC: 36.1 g/dL — ABNORMAL HIGH (ref 30.0–36.0)
MCV: 100.5 fL — ABNORMAL HIGH (ref 80.0–100.0)
Platelets: 47 K/uL — ABNORMAL LOW (ref 150–400)
RBC: 2.18 MIL/uL — ABNORMAL LOW (ref 4.22–5.81)
RDW: 21.1 % — ABNORMAL HIGH (ref 11.5–15.5)
WBC: 8.7 K/uL (ref 4.0–10.5)
nRBC: 0.9 % — ABNORMAL HIGH (ref 0.0–0.2)

## 2024-06-15 LAB — HEMOGLOBIN AND HEMATOCRIT, BLOOD
HCT: 20.6 % — ABNORMAL LOW (ref 39.0–52.0)
Hemoglobin: 7.4 g/dL — ABNORMAL LOW (ref 13.0–17.0)

## 2024-06-15 LAB — COMPREHENSIVE METABOLIC PANEL WITH GFR
ALT: 23 U/L (ref 0–44)
AST: 97 U/L — ABNORMAL HIGH (ref 15–41)
Albumin: 2.4 g/dL — ABNORMAL LOW (ref 3.5–5.0)
Alkaline Phosphatase: 111 U/L (ref 38–126)
Anion gap: 11 (ref 5–15)
BUN: 19 mg/dL (ref 6–20)
CO2: 23 mmol/L (ref 22–32)
Calcium: 8.2 mg/dL — ABNORMAL LOW (ref 8.9–10.3)
Chloride: 97 mmol/L — ABNORMAL LOW (ref 98–111)
Creatinine, Ser: 1.22 mg/dL (ref 0.61–1.24)
GFR, Estimated: >60 mL/min
Glucose, Bld: 129 mg/dL — ABNORMAL HIGH (ref 70–99)
Potassium: 4.4 mmol/L (ref 3.5–5.1)
Sodium: 131 mmol/L — ABNORMAL LOW (ref 135–145)
Total Bilirubin: 20.7 mg/dL (ref 0.0–1.2)
Total Protein: 5.1 g/dL — ABNORMAL LOW (ref 6.5–8.1)

## 2024-06-15 LAB — PROTIME-INR
INR: 2.1 — ABNORMAL HIGH (ref 0.8–1.2)
Prothrombin Time: 24.7 s — ABNORMAL HIGH (ref 11.4–15.2)

## 2024-06-15 MED ORDER — PIPERACILLIN-TAZOBACTAM 3.375 G IVPB
3.3750 g | Freq: Three times a day (TID) | INTRAVENOUS | Status: DC
  Administered 2024-06-15 – 2024-06-16 (×5): 3.375 g via INTRAVENOUS
  Filled 2024-06-15 (×5): qty 50

## 2024-06-15 MED ORDER — ENSURE PLUS HIGH PROTEIN PO LIQD
237.0000 mL | Freq: Two times a day (BID) | ORAL | Status: DC
  Administered 2024-06-18 – 2024-06-19 (×2): 237 mL via ORAL

## 2024-06-15 MED ORDER — LACTULOSE 10 GM/15ML PO SOLN
20.0000 g | Freq: Two times a day (BID) | ORAL | Status: DC
  Administered 2024-06-15 – 2024-06-19 (×9): 20 g via ORAL
  Filled 2024-06-15 (×9): qty 30

## 2024-06-15 MED ORDER — ALBUMIN HUMAN 25 % IV SOLN
25.0000 g | Freq: Four times a day (QID) | INTRAVENOUS | Status: AC
  Administered 2024-06-15 – 2024-06-16 (×4): 25 g via INTRAVENOUS
  Filled 2024-06-15 (×4): qty 100

## 2024-06-15 MED ORDER — PHYTONADIONE 5 MG PO TABS
5.0000 mg | ORAL_TABLET | Freq: Every day | ORAL | Status: DC
  Administered 2024-06-15 – 2024-06-16 (×2): 5 mg via ORAL
  Filled 2024-06-15 (×3): qty 1

## 2024-06-15 MED ORDER — SPIRONOLACTONE 25 MG PO TABS
25.0000 mg | ORAL_TABLET | Freq: Every day | ORAL | Status: DC
  Administered 2024-06-15 – 2024-06-16 (×2): 25 mg via ORAL
  Filled 2024-06-15 (×3): qty 1

## 2024-06-15 MED ORDER — FUROSEMIDE 10 MG/ML IJ SOLN
40.0000 mg | Freq: Every day | INTRAMUSCULAR | Status: DC
  Administered 2024-06-15 – 2024-06-16 (×2): 40 mg via INTRAVENOUS
  Filled 2024-06-15 (×2): qty 4

## 2024-06-15 NOTE — Progress Notes (Signed)
 " PROGRESS NOTE Jeffrey Mathis  FMW:[id number] DOB: 02/03/1981 DOA: 06/14/2024 PCP: [facility]  Brief Narrative/Hospital Course: Jeffrey Mathis is a 44 y.o. 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 In the ED he was noted to be hypoxic with oxygen saturations in the 80s which improved with oxygen.  He was noted to have a large pleural effusion on the left.  Additionally patient had a positive alcohol level, elevated lipase, elevated INR, elevated lactic acid, elevated ammonia, elevated LFTs with a markedly elevated T. bili and elevated lipase,  platelet count of 54 as well as hemoglobin of 6.7.  He was given 1 unit of packed red blood cells.  The patient notes black stools from time to time but no hematemesis or frank rectal bleeding.  Patient has known esophageal varices.  [facility] where there was discussion around transplant however it appears he has a positive BAL here.  The wife reports 30 pound weight loss in the last 7 to 8 months which has mostly been muscle wasting Chest x-ray:Large left pleural effusion with Marked Severity. Chest abdomen pelvis with contrast> large left pleural effusion with near complete collapse of the left lung and mild mediastinal shift, cirrhosis with portal hypertension extensive gastroesophageal varices, mild splenomegaly, mild ascites hypertrophied hepatic artery, likely portal colopathy. Pt had bedside Thora done w/ 2L blood fluid aspirated.  Subjective: Seen and examined today. Patient is alert awake mild abdominal discomfort and chest shortness Appears grossly icteric, cachectic Overnight  low grade temp afebrile,  SBP 99- 110  on 2-3 L [place], Labs-  Creat 0.6> 1.2 hb 6.7> 7.9, wbc normalized , INR 2.1,  plt 47l TB 20,  Assessment and plan:  Large left pleural effusion Acute  hypoxic respiratory failure 2/2 above Hemothorax : Patient had small present 2 months ago now with tachycardia and significant effusion underwent thoracentesis with 2 L of bloody fluid removal. Postprocedure chest x-ray with decreased left pleural effusion with persistent left basilar consolidation. F/u pleural fluid-72% neutrophils, pulmonary notified-Gram stain unremarkable culture pending will start on empiric Zosyn  given his immunocompromise status. Keep on supplemental oxygen  Decompensated alcohol-related cirrhosis-with liver failure Coagulopathy Hyperbilirubinemia Portal hypertension/ascites/Esophageal varices: MELD NA 30 MELD 3.0 at 31 .  With mild encephalopathy, along with coagulopathy indicating liver failure, overall poor prognosis.  GI input appreciated and discussed-starting IV Lasix , Aldactone  w/ albumin   if blood pressure able to tolerate.Continue alcohol withdrawal protocol folate thiamine  multivitamin, continue vitamin K trend INR CBC. Due to ongoing alcohol use will not be a candidate for liver transplant evaluation.  Recent Labs  Lab 06/14/24 1335 06/14/24 1426 06/15/24 0400  AST 125*  --  97*  ALT 28  --  23  ALKPHOS 140*  --  111  BILITOT 18.1*  --  20.7*  PROT 5.9*  --  5.1*  ALBUMIN  2.9*  --  2.4*  AMMONIA 38*  --   --   INR  --  2.0* 2.1*  LIPASE 92*  --   --   PLT 54*  --  47*    ABLA on chronic anemia: Due to hemothorax also with coagulopathy given cirrhosis. ? Esophageal varices contributing. Check FOBT Recent Labs  Lab 06/14/24 1335 06/15/24 0400  HGB 6.7* 7.9*  HCT 19.5* 21.9*    Chronic alcoholic pancreatitis: Lipase is mildly elevated at 92. Cont  diet as tolerated  Alcohol use disorder, severe, dependence Ongoing use reports he relapsed, noted blood alcohol level.  Continue CIWA scale Ativan  thiamine  folate   Prolonged QT interval Avoid QT prolonging agents  Mild hepatic encephalopathy Hyperammonemia Resume  lactulose   Thrombocytopenia From liver cirrhosis monitor  Recent Labs  Lab 06/14/24 1335 06/15/24 0400  PLT 54* 47*   Hyponatremia likely from cirrhosis, monitor  Tobacco use Patient offered and declined nicotine patch  Severe malnutrition with Body mass index is 18.21 kg/m.: Augment diet as able.  DVT prophylaxis: SCDs Start: 06/14/24 1804 Code Status:   Code Status: Full Code Family Communication: plan of care discussed with patient at bedside. Patient status is: Remains hospitalized because of severity of illness Level of care: Med-Surg   Dispo: The patient is from: home            Anticipated disposition: TBD Objective: Vitals last 24 hrs: Vitals:   06/15/24 0023 06/15/24 0245 06/15/24 0456 06/15/24 1000  BP: (!) 108/57 (!) 103/55 (!) 99/49 (!) 90/49  Pulse: (!) 108 (!) 101 100 (!) 109  Resp: 18 18 17 16   Temp: 99.5 F (37.5 C) 99.3 F (37.4 C) 98.6 F (37 C) 98.6 F (37 C)  TempSrc: Oral Oral Oral   SpO2: 98% 96% 96% 95%  Weight:      Height:        Physical Examination: General exam: alert awake, mild confusion present  HEENT:Oral mucosa moist, Ear/Nose WNL grossly Respiratory system: Bilaterally clear BS,no use of accessory muscle Cardiovascular system: S1 & S2 +, No JVD. Gastrointestinal system: Abdomen soft,NT,ND, BS+ Nervous System: Alert, awake, moving all extremities,and following commands. Extremities: extremities warm, leg edema mild Skin: Warm, no rashes, significant icterus MSK: Cachectic   Medications reviewed:  Scheduled Meds:  folic acid   1 mg Oral Daily   furosemide   40 mg Intravenous Daily   lactulose   20 g Oral BID   multivitamin with minerals  1 tablet Oral Daily   pantoprazole   40 mg Oral QHS   phytonadione   5 mg Oral Daily   potassium chloride   20 mEq Oral Daily   sodium chloride  flush  3 mL Intravenous Q12H   spironolactone   25 mg Oral Daily   thiamine   100 mg Oral Daily   Or   thiamine   100 mg Intravenous Daily    Continuous Infusions:  sodium chloride      albumin  human 25 g (06/15/24 1100)   Diet: Diet Order             Diet Heart Room service appropriate? Yes; Fluid consistency: Thin  Diet effective now                  Data Reviewed: I have personally reviewed following labs and imaging studies ( see epic result tab) CBC: Recent Labs  Lab 06/14/24 1335 06/15/24 0400  WBC 12.6* 8.7  NEUTROABS 10.0*  --   HGB 6.7* 7.9*  HCT 19.5* 21.9*  MCV 111.4* 100.5*  PLT 54* 47*   CMP: Recent Labs  Lab 06/14/24 1335 06/15/24 0400  NA 134* 131*  K 4.3 4.4  CL 98 97*  CO2 23 23  GLUCOSE 121* 129*  BUN 8 19  CREATININE 0.61 1.22  CALCIUM 8.4* 8.2*  MG 1.6*  --   PHOS 3.7  --    GFR: Estimated Creatinine Clearance: 70.6 mL/min (by C-G formula based on SCr of 1.22 mg/dL). Recent Labs  Lab 06/14/24 1335 06/15/24 0400  AST  125* 97*  ALT 28 23  ALKPHOS 140* 111  BILITOT 18.1* 20.7*  PROT 5.9* 5.1*  ALBUMIN  2.9* 2.4*    Recent Labs  Lab 06/14/24 1335  LIPASE 92*    Recent Labs  Lab 06/14/24 1335  AMMONIA 38*   Coagulation Profile:  Recent Labs  Lab 06/14/24 1426 06/15/24 0400  INR 2.0* 2.1*   Unresulted Labs (From admission, onward)     Start     Ordered   06/16/24 0500  Comprehensive metabolic panel with GFR  Daily,   R     Question:  Specimen collection method  Answer:  Lab=Lab collect   06/15/24 0718   06/16/24 0500  CBC  Daily,   R     Question:  Specimen collection method  Answer:  Lab=Lab collect   06/15/24 0718   06/16/24 0500  Protime-INR  Daily,   R     Question:  Specimen collection method  Answer:  Lab=Lab collect   06/15/24 0718   06/15/24 1300  Hemoglobin and hematocrit, blood  Once,   R       Question:  Specimen collection method  Answer:  Lab=Lab collect   06/15/24 0815   06/15/24 0815  Urinalysis, Routine w reflex microscopic -Urine, Clean Catch  Once,   R       Question:  Specimen Source  Answer:  Urine, Clean Catch   06/15/24 0815    06/14/24 2251  Glucose, Body Fluid Other  Once,   R        06/14/24 2251   06/14/24 2251  LD, Body Fluid (other)  Once,   R        06/14/24 2251   06/14/24 2251  Protein, body fluid (other)  Once,   R        06/14/24 2251   06/14/24 2244  Triglycerides, Body Fluid  (Thoracentesis Labs Panel)  Once,   R        06/14/24 2243   06/14/24 1902  Blood Culture ID Panel (Reflexed)  Once,   STAT        06/14/24 1902          Antimicrobials/Microbiology: Anti-infectives (From admission, onward)    None         Component Value Date/Time   SDES  06/14/2024 2252    PLEURAL Performed at [facility], [address] [place], [place] [address]    SPECREQUEST  06/14/2024 2252    NONE Performed at [facility], [address] [place], [place] [address]    CULT PENDING 06/14/2024 2252   REPTSTATUS PENDING 06/14/2024 2252    Procedures:  Mennie LAMY, MD [facility] 06/15/2024, 11:20 AM   "

## 2024-06-15 NOTE — Consult Note (Addendum)
 [facility] Consult  Referring Provider: [facility] hospitalist Primary Care Physician:  Clinic, [facility] Primary Gastroenterologist: Sampson  Reason for Consultation: Management of cirrhosis  HPI: Jeffrey Mathis is a 44 y.o. male was in his usual state of health until 3 days ago when he developed progressively worsening shortness of breath which brought him to the ER.  Patient states he stopped drinking in January 2025 but relapsed and had 40 ounces of beer prior to developing shortness of breath.  Patient states in the past he used to drink 4-5 bottles of beer and 2 bottles of wine on a daily basis for at least 6 years.  He states he is compliant with carvedilol  given for esophageal varices, spironolactone  for history of ascites requiring paracentesis in the past and lactulose  as needed for history of hepatic encephalopathy.  He denies recent change in bowel habits, denies noticing blood in stool, black stools or vomiting blood. He has noticed that stools have been unusually dark yellow. Denies swelling of abdomen or legs. States he has lost about 30 pounds in the past 4 months. Denies acid reflux, heartburn, difficulty swallowing or pain on swallowing.   He has a past medical history of:  Alcohol related cirrhosis(outside labs normal for AMA, autoimmune hepatitis, alpha 1 antitrypsin, ceruloplasmin, hepatitis B and hepatitis C, but had elevated iron saturation of 60% with ferritin 588.2),   Recurrent alcohol-related pancreatitis, first episode 2021 (MRI 11/2021)  Family history of colon cancer in father diagnosed at age 31, history of colon polyps in his brother.  Outside previous GI workup: EGD 5/25 at [facility]: 2 columns of esophageal varices, portal hypertensive gastropathy, gastritis, normal duodenum No prior colonoscopy  Past Medical History:  Diagnosis Date   Pancreatitis    PTSD (post-traumatic stress disorder)    PTSD (post-traumatic  stress disorder)     Past Surgical History:  Procedure Laterality Date   ABDOMINAL SURGERY     scrapnel   APPENDECTOMY     arm surgery     KNEE ARTHROSCOPY     R forearm surgery      Prior to Admission medications  Medication Sig Start Date End Date Taking? Authorizing Provider  carvedilol  (COREG ) 6.25 MG tablet Take 3.125 mg by mouth at bedtime.   Yes [provider]  lactulose  (CHRONULAC ) 10 GM/15ML solution Take 20 g by mouth 2 (two) times daily as needed (to lower ammonia levels in the blood).   Yes [provider]  omeprazole (PRILOSEC) 40 MG capsule Take 40 mg by mouth at bedtime.   Yes [provider]  OVER THE COUNTER MEDICATION Place 1-2 drops into both eyes See admin instructions. Real Tears eye drops - Instill 1-2 drops into both eyes three times a day as needed for dryness   Yes [provider]  spironolactone  (ALDACTONE ) 25 MG tablet Take 25 mg by mouth at bedtime.   Yes [provider]  ondansetron  (ZOFRAN -ODT) 4 MG disintegrating tablet Take 1 tablet (4 mg total) by mouth every 8 (eight) hours as needed for nausea or vomiting. Patient not taking: Reported on 06/14/2024 04/30/23   Smoot, Lauraine LABOR, PA-C  oxyCODONE -acetaminophen  (PERCOCET/ROXICET) 5-325 MG tablet Take 1 tablet by mouth every 6 (six) hours as needed for severe pain (pain score 7-10). Patient not taking: Reported on 06/14/2024 04/30/23   Smoot, Lauraine LABOR, PA-C  potassium chloride  20 MEQ TBCR Take 1 tablet (20 mEq total) by mouth daily for 15 days. Patient not taking: Reported on 06/14/2024 10/31/23  06/14/24  Cottie Donnice PARAS, MD    Current Facility-Administered Medications  Medication Dose Route Frequency Provider Last Rate Last Admin   0.9 %  sodium chloride  infusion  250 mL Intravenous PRN Fredirick Glenys RAMAN, MD       folic acid  (FOLVITE ) tablet 1 mg  1 mg Oral Daily Pratt, Tanya S, MD   1 mg at 06/15/24 9173   ibuprofen  (ADVIL ) tablet 400 mg  400 mg Oral Q6H PRN Pratt,  Tanya S, MD   400 mg at 06/14/24 2141   lactulose  (CHRONULAC ) 10 GM/15ML solution 20 g  20 g Oral BID Kc, Mennie, MD       LORazepam  (ATIVAN ) tablet 1-4 mg  1-4 mg Oral Q1H PRN Pratt, Tanya S, MD   1 mg at 06/15/24 9166   Or   LORazepam  (ATIVAN ) injection 1-4 mg  1-4 mg Intravenous Q1H PRN Pratt, Tanya S, MD   1 mg at 06/14/24 1856   multivitamin with minerals tablet 1 tablet  1 tablet Oral Daily Pratt, Tanya S, MD   1 tablet at 06/15/24 9173   oxyCODONE  (Oxy IR/ROXICODONE ) immediate release tablet 2.5 mg  2.5 mg Oral Q4H PRN Alto Isaiah CROME, NP   2.5 mg at 06/14/24 2248   pantoprazole  (PROTONIX ) EC tablet 40 mg  40 mg Oral QHS Pratt, Tanya S, MD   40 mg at 06/14/24 2009   phytonadione  (VITAMIN K) tablet 5 mg  5 mg Oral Daily Kc, Ramesh, MD   5 mg at 06/15/24 0826   polyethylene glycol (MIRALAX  / GLYCOLAX ) packet 17 g  17 g Oral Daily PRN Fredirick Glenys RAMAN, MD       potassium chloride  SA (KLOR-CON  M) CR tablet 20 mEq  20 mEq Oral Daily Fredirick Glenys RAMAN, MD   20 mEq at 06/15/24 9173   promethazine  (PHENERGAN ) 6.25 MG/5ML solution 12.5 mg  12.5 mg Oral Q6H PRN Alto Isaiah CROME, NP       sodium chloride  flush (NS) 0.9 % injection 3 mL  3 mL Intravenous Q12H Fredirick Glenys RAMAN, MD   3 mL at 06/15/24 9173   sodium chloride  flush (NS) 0.9 % injection 3 mL  3 mL Intravenous PRN Fredirick Glenys RAMAN, MD       thiamine  (VITAMIN B1) tablet 100 mg  100 mg Oral Daily Pratt, Tanya S, MD   100 mg at 06/15/24 9173   Or   thiamine  (VITAMIN B1) injection 100 mg  100 mg Intravenous Daily Fredirick Glenys RAMAN, MD        Allergies as of 06/14/2024   (No Known Allergies)    Family History  Problem Relation Age of Onset   Cancer Mother    Bipolar disorder Mother    Cancer Father    Pancreatitis Father     Social History   Socioeconomic History   Marital status: Married    Spouse name: Not on file   Number of children: Not on file   Years of education: Not on file   Highest education level: Not on file  Occupational  History   Not on file  Tobacco Use   Smoking status: Every Day    Current packs/day: 0.15    Types: Cigarettes   Smokeless tobacco: Never  Vaping Use   Vaping status: Never Used  Substance and Sexual Activity   Alcohol use: Not Currently   Drug use: Yes    Comment: CBD use also   Sexual activity: Not on file  Other  Topics Concern   Not on file  Social History Narrative   Not on file   Social Drivers of Health   Tobacco Use: High Risk (06/14/2024)   Patient History    Smoking Tobacco Use: Every Day    Smokeless Tobacco Use: Never    Passive Exposure: Not on file  Financial Resource Strain: Not on file  Food Insecurity: No Food Insecurity (06/14/2024)   Epic    Worried About Programme Researcher, Broadcasting/film/video in the Last Year: Never true    Ran Out of Food in the Last Year: Never true  Transportation Needs: No Transportation Needs (06/14/2024)   Epic    Lack of Transportation (Medical): No    Lack of Transportation (Non-Medical): No  Physical Activity: Not on file  Stress: Not on file  Social Connections: Not on file  Intimate Partner Violence: Not At Risk (06/14/2024)   Epic    Fear of Current or Ex-Partner: No    Emotionally Abused: No    Physically Abused: No    Sexually Abused: No  Depression (PHQ2-9): Not on file  Alcohol Screen: Not on file  Housing: High Risk (06/14/2024)   Epic    Unable to Pay for Housing in the Last Year: Yes    Number of Times Moved in the Last Year: 0    Homeless in the Last Year: No  Utilities: Not At Risk (06/14/2024)   Epic    Threatened with loss of utilities: No  Health Literacy: Not on file    Review of Systems: As per HPI.  Physical Exam: Vital signs in last 24 hours: Temp:  [98.4 F (36.9 C)-100.9 F (38.3 C)] 98.6 F (37 C) (01/12 0456) Pulse Rate:  [91-121] 100 (01/12 0456) Resp:  [17-20] 17 (01/12 0456) BP: (99-144)/(49-95) 99/49 (01/12 0456) SpO2:  [92 %-98 %] 96 % (01/12 0456) Weight:  [62.6 kg] 62.6 kg (01/11 1813) Last BM Date  : 06/14/24  General:   Alert,  Well-developed, well-nourished, pleasant and cooperative in NAD Head:  Normocephalic and atraumatic. Eyes: Deeply icteric, prominent pallor Ears:  Normal auditory acuity. Nose:  No deformity, discharge,  or lesions. Mouth:  No deformity or lesions.  Oropharynx pink & moist. Neck:  Supple; no masses or thyromegaly. Lungs: Slightly decreased breath sounds on left, on oxygen via nasal cannula Heart:  Regular rate and rhythm; no murmurs, clicks, rubs,  or gallops. Extremities:  Without clubbing or edema. Neurologic: Mild asterixis, otherwise alert and  oriented x4;  grossly normal neurologically. Skin:  Intact without significant lesions or rashes. Psych:  Alert and cooperative. Normal mood and affect. Abdomen:  Soft, nontender and nondistended. No masses, hepatosplenomegaly or hernias noted. Normal bowel sounds, without guarding, and without rebound.         Lab Results: Recent Labs    06/14/24 1335 06/15/24 0400  WBC 12.6* 8.7  HGB 6.7* 7.9*  HCT 19.5* 21.9*  PLT 54* 47*   BMET Recent Labs    06/14/24 1335 06/15/24 0400  NA 134* 131*  K 4.3 4.4  CL 98 97*  CO2 23 23  GLUCOSE 121* 129*  BUN 8 19  CREATININE 0.61 1.22  CALCIUM 8.4* 8.2*   LFT Recent Labs    06/15/24 0400  PROT 5.1*  ALBUMIN  2.4*  AST 97*  ALT 23  ALKPHOS 111  BILITOT 20.7*   PT/INR Recent Labs    06/14/24 1426 06/15/24 0400  LABPROT 23.5* 24.7*  INR 2.0* 2.1*    Studies/Results:  DG CHEST PORT 1 VIEW Result Date: 06/14/2024 CLINICAL DATA:  Left pleural effusion status post thoracentesis EXAM: PORTABLE CHEST 1 VIEW COMPARISON:  06/14/2024 FINDINGS: Single frontal view of the chest demonstrates an unremarkable cardiac silhouette. Decreased left pleural effusion after interval thoracentesis, with small residual left pleural effusion and persistent left basilar consolidation. Right chest is clear. No pneumothorax. IMPRESSION: 1. No complication after left  thoracentesis. Decreased left pleural effusion with persistent left basilar consolidation. Electronically Signed   By: Ozell Daring M.D.   On: 06/14/2024 23:04   CT CHEST ABDOMEN PELVIS W CONTRAST Result Date: 06/14/2024 EXAM: CT CHEST, ABDOMEN AND PELVIS WITH CONTRAST 06/14/2024 04:52:22 PM TECHNIQUE: CT of the chest, abdomen and pelvis was performed with the administration of 100 mL of iohexol  (OMNIPAQUE ) 300 MG/ML solution. Multiplanar reformatted images are provided for review. Automated exposure control, iterative reconstruction, and/or weight based adjustment of the mA/kV was utilized to reduce the radiation dose to as low as reasonably achievable. 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 Left upper quadrant abdominal pain, chest pain, dyspnea, hypoxia, jaundice. FINDINGS: CHEST: MEDIASTINUM AND LYMPH NODES: Heart and pericardium are unremarkable. The central airways are clear. No mediastinal, hilar or axillary lymphadenopathy. 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 Mild mediastinal shift to the right. LUNGS AND PLEURA: Large left pleural effusion is present with near complete collapse of the left lung. Mild right basilar dependent atelectasis. Mild emphysema. No pneumothorax. No pleural effusion on the right. ABDOMEN AND PELVIS: LIVER: The portal vein is patent. The hepatic artery is hypertrophied in keeping with chronic changes of underlying cirrhosis. As noted above, extensive gastroesophageal varices were identified in keeping with changes related to portal venous hypertension. GALLBLADDER AND BILE DUCTS: Punctate mural calcification of the gallbladder is nonspecific. Gallbladder wall thickening is nonspecific in the setting of cirrhosis and ascites. No biliary ductal dilatation. SPLEEN: Small splenomegaly is present with the spleen measuring 14.6 cm in greatest dimension. No intrasplenic lesions  identified. PANCREAS: No acute abnormality. ADRENAL GLANDS: No acute abnormality. KIDNEYS, URETERS AND BLADDER: No stones in the kidneys or ureters. No hydronephrosis. No perinephric or periureteral stranding. Urinary bladder is unremarkable. GI AND BOWEL: Stomach demonstrates no acute abnormality. There is circumferential marked wall thickening and submucosal edema involving the cecum and ascending colon which may reflect changes of portal colopathy. Note that infectious or inflammatory colitis could appear similarly, however. There is no bowel obstruction. REPRODUCTIVE ORGANS: No acute abnormality. PERITONEUM AND RETROPERITONEUM: Mild ascites. No free air. VASCULATURE: Aorta is normal in caliber. ABDOMINAL AND PELVIS LYMPH NODES: No lymphadenopathy. BONES AND SOFT TISSUES: No acute osseous abnormality. No focal soft tissue abnormality. IMPRESSION: 1. Large left pleural effusion with near complete collapse of the left lung and mild mediastinal shift to the right. 2. Cirrhosis with portal hypertension manifested by extensive gastroesophageal varices, mild splenomegaly, mild ascites, and hypertrophied hepatic artery. 3. Circumferential marked wall thickening and submucosal edema involving the cecum and ascending colon, which may reflect portal colopathy, with infectious or inflammatory colitis in the differential. 4. Raf score includes emphysema ([id number]). Electronically signed by: Dorethia Molt MD MD 06/14/2024 05:24 PM EST RP Workstation: [id number]   DG Chest Port 1 View Result Date: 06/14/2024 CLINICAL DATA:  Left-sided chest pain. EXAM: PORTABLE CHEST 1 VIEW COMPARISON:  June 10, 2022 FINDINGS: The heart size and mediastinal contours are within normal limits. There is a large left-sided pleural effusion. Marked severity underlying airspace disease is suspected within the  mid right lung and left lung base. The right lung is clear. No pneumothorax is identified. The visualized skeletal structures are  unremarkable. IMPRESSION: Large left-sided pleural effusion with marked severity underlying airspace disease suspected within the mid right lung and left lung base. Correlation with nonemergent chest CT is recommended to further exclude the presence of an underlying neoplastic process. Electronically Signed   By: Suzen Dials M.D.   On: 06/14/2024 13:43    Impression: Decompensated alcohol-related cirrhosis  Large left pleural effusion, status post 2 L left thoracocentesis performed today, bloody appearing fluid drained, WBC 5756 with 74% neutrophils  Ongoing alcohol use, blood alcohol 71 mg per DL on admission, 8/88/7973 MELD sodium score 30(sodium 131, creatinine 1.22, T. bili 20.7, INR 2.1)  CT: Large left pleural effusion with near complete collapse of left lung and mild mediastinal shift to right Cirrhosis, portal hypertension, extensive gastroesophageal varices, mild splenomegaly, mild ascites, hypertrophied hepatic artery Circumferential mild wall thickening and submucosal edema involving cecum and ascending colon reflective of portal colopathy  Lab abnormalities: Anemia, hemoglobin 7.9, MCV 100.5, thrombocytopenia, platelet 47 Coagulopathy, PT/INR 24.7/2.1 Stable renal unction, BUN 19/creatinine 1.22/GFR more than 60  Plan: Has been started on heart healthy diet. Monitor for alcohol withdrawal, continue folic acid  1 mg daily, multivitamin 1 tablet daily, thiamine  100 mg p.o. or IV along with Ativan  as needed.  Continue lactulose  20 g twice a day.  Since he is slightly hypotensive with a blood pressure of 99 x 49 with a heart rate of 100/min, will start furosemide  40 mg IV daily, restart spironolactone  at 25 mg daily, and give IV albumin  for 4 doses.  Consider starting IV antibiotics based on results of pleural effusion.  If blood pressure remains stable after resuming diuretics and with IV albumin , will resume carvedilol  for history of esophageal varices.  Because of  mild encephalopathy and coagulopathy indicating liver failure, patient carries a poor prognosis.  Because of ongoing alcohol use, he will not be a candidate for liver transplant evaluation.    LOS: 1 day   Estelita Manas, MD  06/15/2024, 9:27 AM

## 2024-06-15 NOTE — Progress Notes (Signed)
 PHARMACY - PHYSICIAN COMMUNICATION CRITICAL VALUE ALERT - BLOOD CULTURE IDENTIFICATION (BCID)  Jeffrey Mathis is an 44 y.o. male who presented to [facility] on [facility]/14/2024 with a chief complaint of decompensated cirrhosis and SOB d/t pleural effusion  Assessment:  1/3 BCx bottles growing E coli (no common ESBL/carbapenem resistance genes detected)  Name of physician (or Provider) Contacted: Kc  Current antibiotics: Zosyn   Changes to prescribed antibiotics recommended:  Patient is on recommended antibiotics - No changes needed  Results for orders placed or performed during the hospital encounter of 06/14/24  Blood Culture ID Panel (Reflexed) (Collected: 06/14/2024  7:02 PM)  Result Value Ref Range   Enterococcus faecalis NOT DETECTED NOT DETECTED   Enterococcus Faecium NOT DETECTED NOT DETECTED   Listeria monocytogenes NOT DETECTED NOT DETECTED   Staphylococcus species NOT DETECTED NOT DETECTED   Staphylococcus aureus (BCID) NOT DETECTED NOT DETECTED   Staphylococcus epidermidis NOT DETECTED NOT DETECTED   Staphylococcus lugdunensis NOT DETECTED NOT DETECTED   Streptococcus species NOT DETECTED NOT DETECTED   Streptococcus agalactiae NOT DETECTED NOT DETECTED   Streptococcus pneumoniae NOT DETECTED NOT DETECTED   Streptococcus pyogenes NOT DETECTED NOT DETECTED   A.calcoaceticus-baumannii NOT DETECTED NOT DETECTED   Bacteroides fragilis NOT DETECTED NOT DETECTED   Enterobacterales DETECTED (A) NOT DETECTED   Enterobacter cloacae complex NOT DETECTED NOT DETECTED   Escherichia coli DETECTED (A) NOT DETECTED   Klebsiella aerogenes NOT DETECTED NOT DETECTED   Klebsiella oxytoca NOT DETECTED NOT DETECTED   Klebsiella pneumoniae NOT DETECTED NOT DETECTED   Proteus species NOT DETECTED NOT DETECTED   Salmonella species NOT DETECTED NOT DETECTED   Serratia marcescens NOT DETECTED NOT DETECTED   Haemophilus influenzae NOT DETECTED NOT DETECTED   Neisseria meningitidis NOT DETECTED NOT  DETECTED   Pseudomonas aeruginosa NOT DETECTED NOT DETECTED   Stenotrophomonas maltophilia NOT DETECTED NOT DETECTED   Candida albicans NOT DETECTED NOT DETECTED   Candida auris NOT DETECTED NOT DETECTED   Candida glabrata NOT DETECTED NOT DETECTED   Candida krusei NOT DETECTED NOT DETECTED   Candida parapsilosis NOT DETECTED NOT DETECTED   Candida tropicalis NOT DETECTED NOT DETECTED   Cryptococcus neoformans/gattii NOT DETECTED NOT DETECTED   CTX-M ESBL NOT DETECTED NOT DETECTED   Carbapenem resistance IMP NOT DETECTED NOT DETECTED   Carbapenem resistance KPC NOT DETECTED NOT DETECTED   Carbapenem resistance NDM NOT DETECTED NOT DETECTED   Carbapenem resist OXA 48 LIKE NOT DETECTED NOT DETECTED   Carbapenem resistance VIM NOT DETECTED NOT DETECTED    [name] 06/15/2024  1:04 PM

## 2024-06-15 NOTE — Consult Note (Signed)
" ° °  NAME:  Jeffrey Mathis, MRN:  [id number], DOB:  07-13-1980, LOS: 1 ADMISSION DATE:  06/14/2024, CONSULTATION DATE:  06/14/24 REFERRING MD:  Fredirick, CHIEF COMPLAINT:  SOB   History of Present Illness:  44 year old man with what appears to be end stage alcoholic liver cirrhosis followed at [facility] who presents with months of weight loss, weakness, and SOB.  Workup reveals AOC anemia, decompensated cirrhosis and large pleural effusion with tension features on imaging.  Pulmonary is consulted.  Patient is a poor historian so am relying on wife: she states breathing has been tough for him for very long time.  No recent trauma, no infectious symptoms.  No cough.  28 pack year smoker.  Pertinent  Medical History  ETOH cirrhosis  Significant Hospital Events: Including procedures, antibiotic start and stop dates in addition to other pertinent events   1/11 admit, left thoracentesis done with 2000 cc of bloody appearing fluid  Interim History / Subjective:   Feels weak, no new complaints.  Objective    Blood pressure 92/60, pulse (!) 114, temperature 97.6 F (36.4 C), temperature source Oral, resp. rate 18, height 6' 1 (1.854 m), weight 62.6 kg, SpO2 96%.        Intake/Output Summary (Last 24 hours) at 06/15/2024 1313 Last data filed at 06/15/2024 1222 Gross per 24 hour  Intake 1111 ml  Output 1400 ml  Net -289 ml   Filed Weights   06/14/24 1813  Weight: 62.6 kg    Examination: Ill-appearing, malnourished man, jaundiced Somnolent, arousable and able to answer appropriately Diminished air entry at the lung bases Heart rate regular, tachycardic Abdomen soft, positive bowel sounds Extremities with no edema  Labs/Imaging reviewed Significant for sodium 131, BUN/creatinine 19/1.22 Total bilirubin 20.7, AST 97, ALT 23 WBC 8.7, hemoglobin 7.9, platelets 47  Pleural studies-5756 nucleated cells, 74% neutrophils  CT chest abdomen pelvis reviewed with large left effusion with near  collapse of left lung, cirrhosis with portal hypertension, thickening of cecum and ascending colon  Resolved problem list   Assessment and Plan  Large left pleural effusion status postthoracentesis Appears hemorrhagic and may be old hemothorax from spontaneous bleeding.  Denies any trauma Continue to monitor chest x-ray Follow pleural studies  Sepsis, E. coli bacteremia Started on Zosyn .  Discussed with primary team  End-stage cirrhosis, portal hypertension Possible GI bleed GI is on board.  Continue PPI  Recommend we involve palliative care as he is very sick and may not survive this admission.  Signature:   I personally saw the patient and performed a substantive portion of this encounter, including a complete performance of at least one of the key components (MDM, Hx and/or Exam), in conjunction with the Advanced Practice Provider/ resident   Lonna Coder MD [facility] See Amion for pager  If no response to pager , please call [phone] until 7pm After 7:00 pm call Elink  [phone] 06/15/2024, 1:31 PM         "

## 2024-06-15 NOTE — Plan of Care (Signed)

## 2024-06-15 NOTE — Hospital Course (Addendum)
 Jeffrey Mathis is a 44 y.o. 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 In the ED he was noted to be hypoxic with oxygen saturations in the 80s which improved with oxygen.  He was noted to have a large pleural effusion on the left.  Additionally patient had a positive alcohol level, elevated lipase, elevated INR, elevated lactic acid, elevated ammonia, elevated LFTs with a markedly elevated T. bili and elevated lipase,  platelet count of 54 as well as hemoglobin of 6.7.  He was given 1 unit of packed red blood cells.  The patient notes black stools from time to time but no hematemesis or frank rectal bleeding.  Patient has known esophageal varices.  [facility] where there was discussion around transplant however it appears he has a positive BAL here.  The wife reports 30 pound weight loss in the last 7 to 8 months which has mostly been muscle wasting Chest x-ray:Large left pleural effusion with Marked Severity. Chest abdomen pelvis with contrast> large left pleural effusion with near complete collapse of the left lung and mild mediastinal shift, cirrhosis with portal hypertension extensive gastroesophageal varices, mild splenomegaly, mild ascites hypertrophied hepatic artery, likely portal colopathy. Pt had bedside Thora done w/ 2L blood fluid aspirated.  Subjective: Seen and examined today. Patient is alert awake mild abdominal discomfort and chest shortness Appears grossly icteric, cachectic Overnight  low grade temp afebrile,  SBP 99- 110  on 2-3 L [place], Labs-  Creat 0.6> 1.2 hb 6.7> 7.9, wbc normalized , INR 2.1,  plt 47l TB 20,  Assessment and plan:  Large left pleural effusion Acute hypoxic respiratory failure 2/2 above Hemothorax : Patient had small present 2 months ago now with tachycardia and significant effusion underwent  thoracentesis with 2 L of bloody fluid removal. Postprocedure chest x-ray with decreased left pleural effusion with persistent left basilar consolidation. F/u pleural fluid-72% neutrophils, pulmonary notified-Gram stain unremarkable culture pending will start on empiric Zosyn  given his immunocompromise status. Keep on supplemental oxygen  Sepsis, E. coli bacteremia: Started on zosyn  1/12>> fu c/s. ***   Decompensated alcohol-related cirrhosis-with liver failure Coagulopathy Hyperbilirubinemia Portal hypertension/ascites/Esophageal varices: MELD NA 30 MELD 3.0 at 31 .  With mild encephalopathy, along with coagulopathy indicating liver failure, overall poor prognosis.  GI input appreciated and discussed-starting IV Lasix , Aldactone  w/ albumin   if blood pressure able to tolerate.Continue alcohol withdrawal protocol folate thiamine  multivitamin, continue vitamin K trend INR CBC. Due to ongoing alcohol use will not be a candidate for liver transplant evaluation.  Recent Labs  Lab 06/14/24 1335 06/14/24 1426 06/15/24 0400  AST 125*  --  97*  ALT 28  --  23  ALKPHOS 140*  --  111  BILITOT 18.1*  --  20.7*  PROT 5.9*  --  5.1*  ALBUMIN  2.9*  --  2.4*  AMMONIA 38*  --   --   INR  --  2.0* 2.1*  LIPASE 92*  --   --   PLT 54*  --  47*    ABLA on chronic anemia: Due to hemothorax also with coagulopathy given cirrhosis. ? Esophageal varices contributing. Check FOBT Recent Labs  Lab 06/14/24 1335 06/15/24 0400  HGB 6.7* 7.9*  HCT 19.5* 21.9*    Chronic alcoholic pancreatitis: Lipase is mildly elevated at 92. Cont diet as tolerated  Alcohol use  disorder, severe, dependence Ongoing use reports he relapsed, noted blood alcohol level.  Continue CIWA scale Ativan  thiamine  folate   Prolonged QT interval Avoid QT prolonging agents  Mild hepatic encephalopathy Hyperammonemia Resume lactulose   Thrombocytopenia From liver cirrhosis monitor  Recent Labs  Lab 06/14/24 1335 06/15/24 0400   PLT 54* 47*   Hyponatremia likely from cirrhosis, monitor  Tobacco use Patient offered and declined nicotine patch  Severe malnutrition with Body mass index is 18.21 kg/m.: Augment diet as able.  DVT prophylaxis: SCDs Start: 06/14/24 1804 Code Status:   Code Status: Full Code Family Communication: plan of care discussed with patient at bedside. Patient status is: Remains hospitalized because of severity of illness Level of care: Med-Surg   Dispo: The patient is from: home            Anticipated disposition: TBD Objective: Vitals last 24 hrs: Vitals:   06/15/24 0023 06/15/24 0245 06/15/24 0456 06/15/24 1000  BP: (!) 108/57 (!) 103/55 (!) 99/49 (!) 90/49  Pulse: (!) 108 (!) 101 100 (!) 109  Resp: 18 18 17 16   Temp: 99.5 F (37.5 C) 99.3 F (37.4 C) 98.6 F (37 C) 98.6 F (37 C)  TempSrc: Oral Oral Oral   SpO2: 98% 96% 96% 95%  Weight:      Height:        Physical Examination: General exam: alert awake, mild confusion present  HEENT:Oral mucosa moist, Ear/Nose WNL grossly Respiratory system: Bilaterally clear BS,no use of accessory muscle Cardiovascular system: S1 & S2 +, No JVD. Gastrointestinal system: Abdomen soft,NT,ND, BS+ Nervous System: Alert, awake, moving all extremities,and following commands. Extremities: extremities warm, leg edema mild Skin: Warm, no rashes, significant icterus MSK: Cachectic   Medications reviewed:  Scheduled Meds:  folic acid   1 mg Oral Daily   furosemide   40 mg Intravenous Daily   lactulose   20 g Oral BID   multivitamin with minerals  1 tablet Oral Daily   pantoprazole   40 mg Oral QHS   phytonadione   5 mg Oral Daily   potassium chloride   20 mEq Oral Daily   sodium chloride  flush  3 mL Intravenous Q12H   spironolactone   25 mg Oral Daily   thiamine   100 mg Oral Daily   Or   thiamine   100 mg Intravenous Daily   Continuous Infusions:  sodium chloride      albumin  human 25 g (06/15/24 1100)   Diet: Diet Order              Diet Heart Room service appropriate? Yes; Fluid consistency: Thin  Diet effective now

## 2024-06-15 NOTE — Progress Notes (Signed)
" °   06/15/24 1000  Assess: MEWS Score  Temp 98.6 F (37 C)  BP (!) 90/49  Pulse Rate (!) 109  Resp 16  Level of Consciousness Alert  SpO2 95 %  O2 Device Nasal Cannula  O2 Flow Rate (L/min) 2 L/min  Assess: MEWS Score  MEWS Temp 0  MEWS Systolic 1  MEWS Pulse 1  MEWS RR 0  MEWS LOC 0  MEWS Score 2  MEWS Score Color Yellow  Assess: if the MEWS score is Yellow or Red  Were vital signs accurate and taken at a resting state? Yes  Does the patient meet 2 or more of the SIRS criteria? No  MEWS guidelines implemented  Yes, yellow  Treat  MEWS Interventions Considered administering scheduled or prn medications/treatments as ordered  Take Vital Signs  Increase Vital Sign Frequency  Yellow: Q2hr x1, continue Q4hrs until patient remains green for 12hrs  Escalate  MEWS: Escalate Yellow: Discuss with charge nurse and consider notifying provider and/or RRT  Provider Notification  Provider Name/Title CHRISTOBAL [place], MD  Date Provider Notified 06/15/24  Time Provider Notified 1014  Method of Notification Page  Notification Reason Change in status  Provider response No new orders (message returned)  Date of Provider Response 06/15/24  Time of Provider Response 1017  Assess: SIRS CRITERIA  SIRS Temperature  0  SIRS Respirations  0  SIRS Pulse 1  SIRS WBC 0  SIRS Score Sum  1    "

## 2024-06-16 ENCOUNTER — Inpatient Hospital Stay (HOSPITAL_COMMUNITY)

## 2024-06-16 LAB — COMPREHENSIVE METABOLIC PANEL WITH GFR
ALT: 20 U/L (ref 0–44)
AST: 71 U/L — ABNORMAL HIGH (ref 15–41)
Albumin: 2.9 g/dL — ABNORMAL LOW (ref 3.5–5.0)
Alkaline Phosphatase: 100 U/L (ref 38–126)
Anion gap: 10 (ref 5–15)
BUN: 25 mg/dL — ABNORMAL HIGH (ref 6–20)
CO2: 23 mmol/L (ref 22–32)
Calcium: 8.4 mg/dL — ABNORMAL LOW (ref 8.9–10.3)
Chloride: 97 mmol/L — ABNORMAL LOW (ref 98–111)
Creatinine, Ser: 1 mg/dL (ref 0.61–1.24)
GFR, Estimated: >60 mL/min
Glucose, Bld: 129 mg/dL — ABNORMAL HIGH (ref 70–99)
Potassium: 3.8 mmol/L (ref 3.5–5.1)
Sodium: 129 mmol/L — ABNORMAL LOW (ref 135–145)
Total Bilirubin: 24.8 mg/dL (ref 0.0–1.2)
Total Protein: 5.1 g/dL — ABNORMAL LOW (ref 6.5–8.1)

## 2024-06-16 LAB — CBC
HCT: 17 % — ABNORMAL LOW (ref 39.0–52.0)
Hemoglobin: 6 g/dL — CL (ref 13.0–17.0)
MCH: 35.7 pg — ABNORMAL HIGH (ref 26.0–34.0)
MCHC: 35.3 g/dL (ref 30.0–36.0)
MCV: 101.2 fL — ABNORMAL HIGH (ref 80.0–100.0)
Platelets: 49 K/uL — ABNORMAL LOW (ref 150–400)
RBC: 1.68 MIL/uL — ABNORMAL LOW (ref 4.22–5.81)
RDW: 22.5 % — ABNORMAL HIGH (ref 11.5–15.5)
WBC: 11.7 K/uL — ABNORMAL HIGH (ref 4.0–10.5)
nRBC: 0.3 % — ABNORMAL HIGH (ref 0.0–0.2)

## 2024-06-16 LAB — HEMOGLOBIN AND HEMATOCRIT, BLOOD
HCT: 18.5 % — ABNORMAL LOW (ref 39.0–52.0)
Hemoglobin: 6.6 g/dL — CL (ref 13.0–17.0)

## 2024-06-16 LAB — TRIGLYCERIDES, BODY FLUIDS: Triglycerides, Fluid: 113 mg/dL

## 2024-06-16 LAB — LD, BODY FLUID (OTHER): LD, Body Fluid: 1037 IU/L

## 2024-06-16 LAB — CYTOLOGY - NON PAP

## 2024-06-16 LAB — PREPARE RBC (CROSSMATCH)

## 2024-06-16 LAB — PROTIME-INR
INR: 2.9 — ABNORMAL HIGH (ref 0.8–1.2)
Prothrombin Time: 31.3 s — ABNORMAL HIGH (ref 11.4–15.2)

## 2024-06-16 MED ORDER — ALBUMIN HUMAN 25 % IV SOLN
25.0000 g | Freq: Four times a day (QID) | INTRAVENOUS | Status: AC
  Administered 2024-06-16 – 2024-06-17 (×3): 25 g via INTRAVENOUS
  Filled 2024-06-16 (×4): qty 100

## 2024-06-16 MED ORDER — RIFAXIMIN 550 MG PO TABS
550.0000 mg | ORAL_TABLET | Freq: Two times a day (BID) | ORAL | Status: DC
  Administered 2024-06-16 – 2024-06-19 (×7): 550 mg via ORAL
  Filled 2024-06-16 (×8): qty 1

## 2024-06-16 MED ORDER — SPIRONOLACTONE 25 MG PO TABS: 50.0000 mg | ORAL_TABLET | Freq: Every day | ORAL | Status: DC

## 2024-06-16 MED ORDER — FUROSEMIDE 10 MG/ML IJ SOLN
40.0000 mg | Freq: Two times a day (BID) | INTRAMUSCULAR | Status: DC
  Administered 2024-06-17 – 2024-06-18 (×4): 40 mg via INTRAVENOUS
  Filled 2024-06-16 (×4): qty 4

## 2024-06-16 MED ORDER — SODIUM CHLORIDE 0.9% IV SOLUTION: Freq: Once | INTRAVENOUS | Status: AC

## 2024-06-16 MED ORDER — ACETAMINOPHEN 325 MG PO TABS
650.0000 mg | ORAL_TABLET | Freq: Two times a day (BID) | ORAL | Status: DC | PRN
  Administered 2024-06-16 – 2024-06-18 (×3): 650 mg via ORAL
  Filled 2024-06-16 (×4): qty 2

## 2024-06-16 MED ORDER — SODIUM CHLORIDE 0.9% IV SOLUTION: Freq: Once | INTRAVENOUS | Status: DC

## 2024-06-16 NOTE — Progress Notes (Addendum)
 Subjective: Reports having 3 bowel movements today, denies noticing blood in stool or black stools. Denies feeling short of breath.  Objective: Vital signs in last 24 hours: Temp:  [98.2 F (36.8 C)-100.4 F (38 C)] 99.3 F (37.4 C) (01/13 1230) Pulse Rate:  [116-124] 117 (01/13 1230) Resp:  [16-20] 16 (01/13 1210) BP: (101-128)/(46-83) 110/51 (01/13 1230) SpO2:  [94 %-99 %] 99 % (01/13 1230) Weight change:  Last BM Date : 06/15/24  PE: Deeply icteric, alert but awake, oriented x 3 GENERAL: Prominent pallor, tachycardic  ABDOMEN: Nondistended, bowel sounds normal active EXTREMITIES: No edema  Lab Results: Results for orders placed or performed during the hospital encounter of 06/14/24 (from the past 48 hours)  ABO/Rh     Status: None   Collection Time: 06/14/24  1:25 PM  Result Value Ref Range   ABO/RH(D)      A POS Performed at [facility], [address] [place], [place] [address]   Comprehensive metabolic panel     Status: Abnormal   Collection Time: 06/14/24  1:35 PM  Result Value Ref Range   Sodium 134 (L) 135 - 145 mmol/L   Potassium 4.3 3.5 - 5.1 mmol/L   Chloride 98 98 - 111 mmol/L   CO2 23 22 - 32 mmol/L   Glucose, Bld 121 (H) 70 - 99 mg/dL    Comment: Glucose reference range applies only to samples taken after fasting for at least 8 hours.   BUN 8 6 - 20 mg/dL   Creatinine, Ser 9.38 0.61 - 1.24 mg/dL   Calcium 8.4 (L) 8.9 - 10.3 mg/dL   Total Protein 5.9 (L) 6.5 - 8.1 g/dL   Albumin  2.9 (L) 3.5 - 5.0 g/dL   AST 874 (H) 15 - 41 U/L   ALT 28 0 - 44 U/L   Alkaline Phosphatase 140 (H) 38 - 126 U/L   Total Bilirubin 18.1 (HH) 0.0 - 1.2 mg/dL    Comment: CRITICAL RESULT CALLED TO, READ BACK BY AND VERIFIED WITH: CHUCKIE LABOR RN @ 1452 06/14/24. GILBERTL    GFR, Estimated >60 >60 mL/min    Comment: (NOTE) Calculated using the CKD-EPI Creatinine Equation (2021)    Anion gap 13 5 - 15    Comment: Performed at [facility],  [address], [place], [place] [address]  Ethanol     Status: Abnormal   Collection Time: 06/14/24  1:35 PM  Result Value Ref Range   Alcohol, Ethyl (B) 71 (H) <15 mg/dL    Comment: (NOTE) For medical purposes only. Performed at [facility], [address] [place], [place] [address]   Lipase, blood     Status: Abnormal   Collection Time: 06/14/24  1:35 PM  Result Value Ref Range   Lipase 92 (H) 11 - 51 U/L    Comment: Performed at [facility], [address], [place], [place] [address]  Troponin T, High Sensitivity     Status: None   Collection Time: 06/14/24  1:35 PM  Result Value Ref Range   Troponin T High Sensitivity <15 0 - 19 ng/L    Comment: Performed at [facility], [address] [place], [place] [address]  CBC with Differential     Status: Abnormal   Collection Time: 06/14/24  1:35 PM  Result Value Ref Range   WBC 12.6 (H) 4.0 - 10.5 K/uL   RBC 1.75 (L) 4.22 - 5.81 MIL/uL   Hemoglobin 6.7 (  LL) 13.0 - 17.0 g/dL    Comment: REPEATED TO VERIFY This critical result has been called to [facility], A RN by Dickey Primmer on 06/14/2024 14:29:58, and has been read back.    HCT 19.5 (L) 39.0 - 52.0 %   MCV 111.4 (H) 80.0 - 100.0 fL   MCH 38.3 (H) 26.0 - 34.0 pg   MCHC 34.4 30.0 - 36.0 g/dL   RDW 83.0 (H) 88.4 - 84.4 %   Platelets 54 (L) 150 - 400 K/uL    Comment: SPECIMEN CHECKED FOR CLOTS PLATELET COUNT CONFIRMED BY SMEAR REPEATED TO VERIFY Immature Platelet Fraction may be clinically indicated, consider ordering this additional test [id number]    nRBC 0.2 0.0 - 0.2 %   Neutrophils Relative % 79 %   Neutro Abs 10.0 (H) 1.7 - 7.7 K/uL   Lymphocytes Relative 10 %   Lymphs Abs 1.3 0.7 - 4.0 K/uL   Monocytes Relative 9 %   Monocytes Absolute 1.1 (H) 0.1 - 1.0 K/uL   Eosinophils Relative 0 %   Eosinophils Absolute 0.0 0.0 - 0.5 K/uL   Basophils Relative 1 %   Basophils Absolute 0.2 (H) 0.0 - 0.1 K/uL   Immature  Granulocytes 1 %   Abs Immature Granulocytes 0.09 (H) 0.00 - 0.07 K/uL    Comment: Performed at [facility], [address], [place], [place] [address]  Type and screen [facility]     Status: None (Preliminary result)   Collection Time: 06/14/24  1:35 PM  Result Value Ref Range   ABO/RH(D) A POS    Antibody Screen NEG    Sample Expiration 06/17/2024,2359    Unit Number [id number]    Blood Component Type RED CELLS,LR    Unit division 00    Status of Unit ISSUED,FINAL    Transfusion Status OK TO TRANSFUSE    Crossmatch Result Compatible    Unit Number [id number]    Blood Component Type RED CELLS,LR    Unit division 00    Status of Unit ISSUED,FINAL    Transfusion Status OK TO TRANSFUSE    Crossmatch Result Compatible    Unit Number [id number]    Blood Component Type RED CELLS,LR    Unit division 00    Status of Unit ISSUED    Transfusion Status OK TO TRANSFUSE    Crossmatch Result      Compatible Performed at [facility], [address], [place], [place] [address]    Unit Number [id number]    Blood Component Type RED CELLS,LR    Unit division 00    Status of Unit ALLOCATED    Transfusion Status OK TO TRANSFUSE    Crossmatch Result Compatible   Culture, blood (routine x 2)     Status: None (Preliminary result)   Collection Time: 06/14/24  1:35 PM   Specimen: BLOOD  Result Value Ref Range   Specimen Description      BLOOD LEFT ANTECUBITAL Performed at [facility], [address] [place], [place] [address]    Special Requests      BOTTLES DRAWN AEROBIC AND ANAEROBIC Blood Culture adequate volume Performed at [facility], [address] [place], [place] [address]    Culture      NO GROWTH 2 DAYS Performed at [facility], [address] [place], [place] [address]    Report Status PENDING   Ammonia     Status: Abnormal   Collection  Time: 06/14/24   1:35 PM  Result Value Ref Range   Ammonia 38 (H) 9 - 35 umol/L    Comment: Performed at [facility] [address], [place], [place] [address]  Magnesium      Status: Abnormal   Collection Time: 06/14/24  1:35 PM  Result Value Ref Range   Magnesium  1.6 (L) 1.7 - 2.4 mg/dL    Comment: Performed at [facility], [address], [place], [place] [address]  Phosphorus     Status: None   Collection Time: 06/14/24  1:35 PM  Result Value Ref Range   Phosphorus 3.7 2.5 - 4.6 mg/dL    Comment: Performed at [facility], [address], [place], [place] [address]  I-Stat Lactic Acid     Status: Abnormal   Collection Time: 06/14/24  1:48 PM  Result Value Ref Range   Lactic Acid, Venous 2.6 (HH) 0.5 - 1.9 mmol/L   Comment NOTIFIED PHYSICIAN   Protime-INR     Status: Abnormal   Collection Time: 06/14/24  2:26 PM  Result Value Ref Range   Prothrombin Time 23.5 (H) 11.4 - 15.2 seconds    Comment: ICTERUS AT THIS LEVEL MAY AFFECT RESULT   INR 2.0 (H) 0.8 - 1.2    Comment: ICTERUS AT THIS LEVEL MAY AFFECT RESULT Performed at [facility], [address] [place], [place] [address]   Prepare RBC (crossmatch)     Status: None   Collection Time: 06/14/24  2:51 PM  Result Value Ref Range   Order Confirmation      ORDER PROCESSED BY BLOOD BANK Performed at [facility], [address], [place], [place] [address]   Troponin T, High Sensitivity     Status: None   Collection Time: 06/14/24  4:14 PM  Result Value Ref Range   Troponin T High Sensitivity <15 0 - 19 ng/L    Comment: (NOTE) Biotin concentrations > 1000 ng/mL falsely decrease TnT results.  Serial cardiac troponin measurements are suggested.  Refer to the Links section for chest pain algorithms and additional  guidance. Performed at [facility], [address], [place], [place] [address]   Urinalysis, Routine w reflex  microscopic -Urine, Clean Catch     Status: Abnormal   Collection Time: 06/14/24  4:15 PM  Result Value Ref Range   Color, Urine AMBER (A) YELLOW    Comment: BIOCHEMICALS MAY BE AFFECTED BY COLOR   APPearance HAZY (A) CLEAR   Specific Gravity, Urine 1.018 1.005 - 1.030   pH 7.0 5.0 - 8.0   Glucose, UA NEGATIVE NEGATIVE mg/dL   Hgb urine dipstick NEGATIVE NEGATIVE   Bilirubin Urine MODERATE (A) NEGATIVE   Ketones, ur NEGATIVE NEGATIVE mg/dL   Protein, ur NEGATIVE NEGATIVE mg/dL   Nitrite NEGATIVE NEGATIVE   Leukocytes,Ua NEGATIVE NEGATIVE    Comment: Performed at [facility], [address], [place], [place] [address]  Urine rapid drug screen (hosp performed)     Status: None   Collection Time: 06/14/24  4:15 PM  Result Value Ref Range   Opiates NEGATIVE NEGATIVE   Cocaine NEGATIVE NEGATIVE   Benzodiazepines NEGATIVE NEGATIVE   Amphetamines NEGATIVE NEGATIVE   Tetrahydrocannabinol NEGATIVE NEGATIVE   Barbiturates NEGATIVE NEGATIVE   Methadone Scn, Ur NEGATIVE NEGATIVE   Fentanyl NEGATIVE NEGATIVE    Comment: (NOTE) Drug screen is for Medical Purposes only. Positive results are preliminary only. If confirmation is needed, notify  lab within 5 days.  Drug Class                 Cutoff (ng/mL) Amphetamine and metabolites 1000 Barbiturate and metabolites 200 Benzodiazepine              200 Opiates and metabolites     300 Cocaine and metabolites     300 THC                         50 Fentanyl                    5 Methadone                   300  Trazodone  is metabolized in vivo to several metabolites,  including pharmacologically active m-CPP, which is excreted in the  urine.  Immunoassay screens for amphetamines and MDMA have potential  cross-reactivity with these compounds and may provide false positive  result.  Performed at [facility], [address] [place], [place] [address]   Culture, blood (routine x 2)     Status: Abnormal  (Preliminary result)   Collection Time: 06/14/24  7:02 PM   Specimen: BLOOD  Result Value Ref Range   Specimen Description      BLOOD BLOOD RIGHT ARM AEROBIC BOTTLE ONLY Performed at [facility], [address] [place], [place] [address]    Special Requests      BOTTLES DRAWN AEROBIC ONLY Blood Culture adequate volume Performed at [facility], [address], [place], [place] [address]    Culture  Setup Time      GRAM NEGATIVE RODS AEROBIC BOTTLE ONLY CRITICAL RESULT CALLED TO, READ BACK BY AND VERIFIED WITH: PHARMD Crystal R on [id number] @1259  by SM    Culture (A)     ESCHERICHIA COLI SUSCEPTIBILITIES TO FOLLOW Performed at [facility], [address], [place], [place] [address]    Report Status PENDING   HIV Antibody (routine testing w rflx)     Status: None   Collection Time: 06/14/24  7:02 PM  Result Value Ref Range   HIV Screen 4th Generation wRfx Non Reactive Non Reactive    Comment: Performed at [facility], [address] [place], [place] [address]  TSH     Status: None   Collection Time: 06/14/24  7:02 PM  Result Value Ref Range   TSH 1.200 0.350 - 4.500 uIU/mL    Comment: Performed at [facility], [address] [place], [place] [address]  Blood Culture ID Panel (Reflexed)     Status: Abnormal   Collection Time: 06/14/24  7:02 PM  Result Value Ref Range   Enterococcus faecalis NOT DETECTED NOT DETECTED   Enterococcus Faecium NOT DETECTED NOT DETECTED   Listeria monocytogenes NOT DETECTED NOT DETECTED   Staphylococcus species NOT DETECTED NOT DETECTED   Staphylococcus aureus (BCID) NOT DETECTED NOT DETECTED   Staphylococcus epidermidis NOT DETECTED NOT DETECTED   Staphylococcus lugdunensis NOT DETECTED NOT DETECTED   Streptococcus species NOT DETECTED NOT DETECTED   Streptococcus agalactiae NOT DETECTED NOT DETECTED   Streptococcus pneumoniae NOT DETECTED NOT DETECTED   Streptococcus  pyogenes NOT DETECTED NOT DETECTED   A.calcoaceticus-baumannii NOT DETECTED NOT DETECTED   Bacteroides fragilis NOT DETECTED NOT DETECTED   Enterobacterales DETECTED (A) NOT DETECTED    Comment: Enterobacterales represent a large order of gram negative bacteria,  not a single organism. CRITICAL RESULT CALLED TO, READ BACK BY AND VERIFIED WITH: PHARMD Crystal R on [id number] @1259  by SM    Enterobacter cloacae complex NOT DETECTED NOT DETECTED   Escherichia coli DETECTED (A) NOT DETECTED    Comment: CRITICAL RESULT CALLED TO, READ BACK BY AND VERIFIED WITH: PHARMD Crystal R on [id number] @1259  by SM    Klebsiella aerogenes NOT DETECTED NOT DETECTED   Klebsiella oxytoca NOT DETECTED NOT DETECTED   Klebsiella pneumoniae NOT DETECTED NOT DETECTED   Proteus species NOT DETECTED NOT DETECTED   Salmonella species NOT DETECTED NOT DETECTED   Serratia marcescens NOT DETECTED NOT DETECTED   Haemophilus influenzae NOT DETECTED NOT DETECTED   Neisseria meningitidis NOT DETECTED NOT DETECTED   Pseudomonas aeruginosa NOT DETECTED NOT DETECTED   Stenotrophomonas maltophilia NOT DETECTED NOT DETECTED   Candida albicans NOT DETECTED NOT DETECTED   Candida auris NOT DETECTED NOT DETECTED   Candida glabrata NOT DETECTED NOT DETECTED   Candida krusei NOT DETECTED NOT DETECTED   Candida parapsilosis NOT DETECTED NOT DETECTED   Candida tropicalis NOT DETECTED NOT DETECTED   Cryptococcus neoformans/gattii NOT DETECTED NOT DETECTED   CTX-M ESBL NOT DETECTED NOT DETECTED   Carbapenem resistance IMP NOT DETECTED NOT DETECTED   Carbapenem resistance KPC NOT DETECTED NOT DETECTED   Carbapenem resistance NDM NOT DETECTED NOT DETECTED   Carbapenem resist OXA 48 LIKE NOT DETECTED NOT DETECTED   Carbapenem resistance VIM NOT DETECTED NOT DETECTED    Comment: Performed at [facility], [address], [place], [place] [address]  Body fluid cell count with differential     Status: Abnormal   Collection Time:  06/14/24 10:51 PM  Result Value Ref Range   Fluid Type-FCT PLEURAL    Color, Fluid RED    Appearance, Fluid TURBID (A) CLEAR   Total Nucleated Cell Count, Fluid 5,756 (H) 0 - 1,000 cu mm   Neutrophil Count, Fluid 74 (H) 0 - 25 %   Lymphs, Fluid 10 %   Monocyte-Macrophage-Serous Fluid 12 (L) 50 - 90 %   Eos, Fluid 4 %    Comment: Performed at [facility], [address], [place], [place] [address]  Body fluid culture w Gram Stain     Status: None (Preliminary result)   Collection Time: 06/14/24 10:52 PM   Specimen: Pleural Fluid  Result Value Ref Range   Specimen Description      PLEURAL Performed at [facility], [address], [place], [place] [address]    Special Requests      NONE Performed at [facility], [address] [place], [place] [address]    Gram Stain      WBC PRESENT, PREDOMINANTLY PMN NO ORGANISMS SEEN CYTOSPIN SMEAR    Culture      NO GROWTH 1 DAY Performed at [facility], [address], [place], [place] [address]    Report Status PENDING   Comprehensive metabolic panel     Status: Abnormal   Collection Time: 06/15/24  4:00 AM  Result Value Ref Range   Sodium 131 (L) 135 - 145 mmol/L   Potassium 4.4 3.5 - 5.1 mmol/L   Chloride 97 (L) 98 - 111 mmol/L   CO2 23 22 - 32 mmol/L   Glucose, Bld 129 (H) 70 - 99 mg/dL    Comment: Glucose reference range applies only to samples taken after fasting for at least 8 hours.   BUN  19 6 - 20 mg/dL   Creatinine, Ser 8.77 0.61 - 1.24 mg/dL    Comment: ICTERUS AT THIS LEVEL MAY AFFECT RESULT   Calcium 8.2 (L) 8.9 - 10.3 mg/dL   Total Protein 5.1 (L) 6.5 - 8.1 g/dL    Comment: ICTERUS AT THIS LEVEL MAY AFFECT RESULT   Albumin  2.4 (L) 3.5 - 5.0 g/dL   AST 97 (H) 15 - 41 U/L   ALT 23 0 - 44 U/L   Alkaline Phosphatase 111 38 - 126 U/L   Total Bilirubin 20.7 (HH) 0.0 - 1.2 mg/dL    Comment: Critical Value, Read Back and verified with MYRTIS CARPEN, RN  [phone] 06/15/24 BY VIN    GFR, Estimated >60 >60 mL/min    Comment: (NOTE) Calculated using the CKD-EPI Creatinine Equation (2021)    Anion gap 11 5 - 15    Comment: Performed at [facility], [address], [place], [place] [address]  CBC     Status: Abnormal   Collection Time: 06/15/24  4:00 AM  Result Value Ref Range   WBC 8.7 4.0 - 10.5 K/uL   RBC 2.18 (L) 4.22 - 5.81 MIL/uL   Hemoglobin 7.9 (L) 13.0 - 17.0 g/dL   HCT 78.0 (L) 60.9 - 47.9 %   MCV 100.5 (H) 80.0 - 100.0 fL    Comment: DELTA CHECK NOTED   MCH 36.2 (H) 26.0 - 34.0 pg   MCHC 36.1 (H) 30.0 - 36.0 g/dL   RDW 78.8 (H) 88.4 - 84.4 %   Platelets 47 (L) 150 - 400 K/uL    Comment: SPECIMEN CHECKED FOR CLOTS REPEATED TO VERIFY Immature Platelet Fraction may be clinically indicated, consider ordering this additional test [id number]    nRBC 0.9 (H) 0.0 - 0.2 %    Comment: Performed at [facility], [address] [place], [place] [address]  Protime-INR     Status: Abnormal   Collection Time: 06/15/24  4:00 AM  Result Value Ref Range   Prothrombin Time 24.7 (H) 11.4 - 15.2 seconds   INR 2.1 (H) 0.8 - 1.2    Comment: (NOTE) INR goal varies based on device and disease states. Performed at [facility], [address], [place], [place] [address]   Occult blood card to lab, stool     Status: None   Collection Time: 06/15/24 10:40 AM  Result Value Ref Range   Fecal Occult Bld NEGATIVE NEGATIVE    Comment: Performed at [facility], [address], [place], [place] [address]  Urinalysis, Routine w reflex microscopic -Urine, Clean Catch     Status: Abnormal   Collection Time: 06/15/24 12:22 PM  Result Value Ref Range   Color, Urine AMBER (A) YELLOW    Comment: BIOCHEMICALS MAY BE AFFECTED BY COLOR   APPearance CLEAR CLEAR   Specific Gravity, Urine 1.008 1.005 - 1.030   pH 6.0 5.0 - 8.0   Glucose, UA NEGATIVE NEGATIVE mg/dL   Hgb urine  dipstick SMALL (A) NEGATIVE   Bilirubin Urine MODERATE (A) NEGATIVE   Ketones, ur NEGATIVE NEGATIVE mg/dL   Protein, ur NEGATIVE NEGATIVE mg/dL   Nitrite NEGATIVE NEGATIVE   Leukocytes,Ua NEGATIVE NEGATIVE   RBC / HPF 0-5 0 - 5 RBC/hpf   WBC, UA 0-5 0 - 5 WBC/hpf   Bacteria, UA NONE SEEN NONE SEEN   Squamous Epithelial / HPF 0-5 0 - 5 /HPF   Mucus PRESENT    Hyaline Casts, UA  PRESENT     Comment: Performed at [facility], [address] [place], [place] [address]  Hemoglobin and hematocrit, blood     Status: Abnormal   Collection Time: 06/15/24 12:40 PM  Result Value Ref Range   Hemoglobin 7.4 (L) 13.0 - 17.0 g/dL   HCT 79.3 (L) 60.9 - 47.9 %    Comment: Performed at [facility], [address] [place], [place] [address]  Comprehensive metabolic panel with GFR     Status: Abnormal   Collection Time: 06/16/24  4:31 AM  Result Value Ref Range   Sodium 129 (L) 135 - 145 mmol/L   Potassium 3.8 3.5 - 5.1 mmol/L   Chloride 97 (L) 98 - 111 mmol/L   CO2 23 22 - 32 mmol/L   Glucose, Bld 129 (H) 70 - 99 mg/dL    Comment: Glucose reference range applies only to samples taken after fasting for at least 8 hours.   BUN 25 (H) 6 - 20 mg/dL   Creatinine, Ser 8.99 0.61 - 1.24 mg/dL    Comment: ICTERUS AT THIS LEVEL MAY AFFECT RESULT   Calcium 8.4 (L) 8.9 - 10.3 mg/dL   Total Protein 5.1 (L) 6.5 - 8.1 g/dL    Comment: ICTERUS AT THIS LEVEL MAY AFFECT RESULT   Albumin  2.9 (L) 3.5 - 5.0 g/dL   AST 71 (H) 15 - 41 U/L   ALT 20 0 - 44 U/L   Alkaline Phosphatase 100 38 - 126 U/L   Total Bilirubin 24.8 (HH) 0.0 - 1.2 mg/dL    Comment: Critical value noted. Value is consistent with previously reported and called value    GFR, Estimated >60 >60 mL/min    Comment: (NOTE) Calculated using the CKD-EPI Creatinine Equation (2021)    Anion gap 10 5 - 15    Comment: Performed at [facility], [address], [place], [place] [address]  CBC      Status: Abnormal   Collection Time: 06/16/24  4:31 AM  Result Value Ref Range   WBC 11.7 (H) 4.0 - 10.5 K/uL   RBC 1.68 (L) 4.22 - 5.81 MIL/uL   Hemoglobin 6.0 (LL) 13.0 - 17.0 g/dL    Comment: This critical result has been called to DONER C. RN by Jolande Mecial on 06/16/2024 05:12:03, and has been read back.   HCT 17.0 (L) 39.0 - 52.0 %   MCV 101.2 (H) 80.0 - 100.0 fL   MCH 35.7 (H) 26.0 - 34.0 pg   MCHC 35.3 30.0 - 36.0 g/dL   RDW 77.4 (H) 88.4 - 84.4 %   Platelets 49 (L) 150 - 400 K/uL    Comment: SPECIMEN CHECKED FOR CLOTS Immature Platelet Fraction may be clinically indicated, consider ordering this additional test [id number]    nRBC 0.3 (H) 0.0 - 0.2 %    Comment: Performed at [facility], [address], [place], [place] [address]  Protime-INR     Status: Abnormal   Collection Time: 06/16/24  4:31 AM  Result Value Ref Range   Prothrombin Time 31.3 (H) 11.4 - 15.2 seconds   INR 2.9 (H) 0.8 - 1.2    Comment: (NOTE) INR goal varies based on device and disease states. Performed at [facility], [address], [place], [place] [address]   Prepare RBC (crossmatch)     Status: None   Collection Time: 06/16/24  5:50 AM  Result Value Ref Range   Order Confirmation  ORDER PROCESSED BY BLOOD BANK Performed at [facility], [address], [place], [place] [address]     Studies/Results: DG CHEST PORT 1 VIEW Result Date: 06/16/2024 EXAM: 1 VIEW(S) XRAY OF THE CHEST 06/16/2024 06:29:00 AM COMPARISON: 06/15/2024 CLINICAL HISTORY: Hemothorax FINDINGS: LUNGS AND PLEURA: Moderate left pleural effusion, increased from prior. Hazy opacity throughout left lung, increased from prior, which likely represents a combination of posteriorly layering pleural fluid and atelectasis. No pneumothorax. HEART AND MEDIASTINUM: No acute abnormality of the cardiac and mediastinal silhouettes. BONES AND SOFT TISSUES: No acute osseous  abnormality. IMPRESSION: 1. Moderate left pleural effusion, increased from prior. 2. Hazy opacity throughout the left lung, increased from prior, likely representing a combination of posteriorly layering pleural fluid and atelectasis. Electronically signed by: Waddell Calk MD MD 06/16/2024 08:35 AM EST RP Workstation: [id number]   DG CHEST PORT 1 VIEW Result Date: 06/15/2024 CLINICAL DATA:  Follow-up pleural effusion. EXAM: PORTABLE CHEST 1 VIEW COMPARISON:  Chest radiograph dated 06/14/2024. FINDINGS: Similar or slightly increased left pleural effusion with left lung base atelectasis or infiltrate. No pneumothorax. Stable cardiac silhouette. No acute osseous pathology. IMPRESSION: Similar or slightly increased left pleural effusion with left lung base atelectasis or infiltrate. Electronically Signed   By: Vanetta Chou M.D.   On: 06/15/2024 18:52   DG CHEST PORT 1 VIEW Result Date: 06/14/2024 CLINICAL DATA:  Left pleural effusion status post thoracentesis EXAM: PORTABLE CHEST 1 VIEW COMPARISON:  06/14/2024 FINDINGS: Single frontal view of the chest demonstrates an unremarkable cardiac silhouette. Decreased left pleural effusion after interval thoracentesis, with small residual left pleural effusion and persistent left basilar consolidation. Right chest is clear. No pneumothorax. IMPRESSION: 1. No complication after left thoracentesis. Decreased left pleural effusion with persistent left basilar consolidation. Electronically Signed   By: Ozell Daring M.D.   On: 06/14/2024 23:04   CT CHEST ABDOMEN PELVIS W CONTRAST Result Date: 06/14/2024 EXAM: CT CHEST, ABDOMEN AND PELVIS WITH CONTRAST 06/14/2024 04:52:22 PM TECHNIQUE: CT of the chest, abdomen and pelvis was performed with the administration of 100 mL of iohexol  (OMNIPAQUE ) 300 MG/ML solution. Multiplanar reformatted images are provided for review. Automated exposure control, iterative reconstruction, and/or weight based adjustment of the mA/kV was  utilized to reduce the radiation dose to as low as reasonably achievable. 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 Left upper quadrant abdominal pain, chest pain, dyspnea, hypoxia, jaundice. FINDINGS: CHEST: MEDIASTINUM AND LYMPH NODES: Heart and pericardium are unremarkable. The central airways are clear. No mediastinal, hilar or axillary lymphadenopathy. 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 Mild mediastinal shift to the right. LUNGS AND PLEURA: Large left pleural effusion is present with near complete collapse of the left lung. Mild right basilar dependent atelectasis. Mild emphysema. No pneumothorax. No pleural effusion on the right. ABDOMEN AND PELVIS: LIVER: The portal vein is patent. The hepatic artery is hypertrophied in keeping with chronic changes of underlying cirrhosis. As noted above, extensive gastroesophageal varices were identified in keeping with changes related to portal venous hypertension. GALLBLADDER AND BILE DUCTS: Punctate mural calcification of the gallbladder is nonspecific. Gallbladder wall thickening is nonspecific in the setting of cirrhosis and ascites. No biliary ductal dilatation. SPLEEN: Small splenomegaly is present with the spleen measuring 14.6 cm in greatest dimension. No intrasplenic lesions identified. PANCREAS: No acute abnormality. ADRENAL GLANDS: No acute abnormality. KIDNEYS, URETERS AND BLADDER: No stones in the kidneys or ureters. No hydronephrosis. No perinephric or periureteral stranding. Urinary bladder  is unremarkable. GI AND BOWEL: Stomach demonstrates no acute abnormality. There is circumferential marked wall thickening and submucosal edema involving the cecum and ascending colon which may reflect changes of portal colopathy. Note that infectious or inflammatory colitis could appear similarly, however. There is no bowel obstruction. REPRODUCTIVE ORGANS: No acute  abnormality. PERITONEUM AND RETROPERITONEUM: Mild ascites. No free air. VASCULATURE: Aorta is normal in caliber. ABDOMINAL AND PELVIS LYMPH NODES: No lymphadenopathy. BONES AND SOFT TISSUES: No acute osseous abnormality. No focal soft tissue abnormality. IMPRESSION: 1. Large left pleural effusion with near complete collapse of the left lung and mild mediastinal shift to the right. 2. Cirrhosis with portal hypertension manifested by extensive gastroesophageal varices, mild splenomegaly, mild ascites, and hypertrophied hepatic artery. 3. Circumferential marked wall thickening and submucosal edema involving the cecum and ascending colon, which may reflect portal colopathy, with infectious or inflammatory colitis in the differential. 4. Raf score includes emphysema ([id number]). Electronically signed by: Dorethia Molt MD MD 06/14/2024 05:24 PM EST RP Workstation: [id number]   DG Chest Port 1 View Result Date: 06/14/2024 CLINICAL DATA:  Left-sided chest pain. EXAM: PORTABLE CHEST 1 VIEW COMPARISON:  June 10, 2022 FINDINGS: The heart size and mediastinal contours are within normal limits. There is a large left-sided pleural effusion. Marked severity underlying airspace disease is suspected within the mid right lung and left lung base. The right lung is clear. No pneumothorax is identified. The visualized skeletal structures are unremarkable. IMPRESSION: Large left-sided pleural effusion with marked severity underlying airspace disease suspected within the mid right lung and left lung base. Correlation with nonemergent chest CT is recommended to further exclude the presence of an underlying neoplastic process. Electronically Signed   By: Suzen Dials M.D.   On: 06/14/2024 13:43    Medications: I have reviewed the patient's current medications.  Assessment: Decompensated liver cirrhosis, likely related to significant alcohol use, blood alcohol level 71 mg/dL on 8/88/7973 On folic acid , multivitamin and  thiamine  and IV Ativan  as needed On lactulose  20 g twice a day  MELD sodium score 33(sodium 129, creatinine 1, T. bili 24.8, INR 2.9)  Worsening coagulopathy, PT 31.3/INR 2.9(on vitamin K 5 mg p.o. daily as per primary team)  Renal function stable, creatinine 1.22 yesterday and 1 today, GFR remains above 60, BUN slightly elevated from 19 to 25  Hemoglobin dropped from 6.4-6 without melena or hematochezia This could be hemodilutional, possibly related IV albumin  use FOBT  negative on 06/15/2024 On pantoprazole  40 mg once a day Patient received 1 unit PRBC transfusion on 06/14/2024, 1 unit PRBC transfusion on 06/15/2024 and is receiving 1 unit transfusion today on 06/16/2024  Thrombocytopenia, platelet 49  Status post thoracocentesis, WBC 5756 with 74% neutrophils Chest x-ray today showed moderate left pleural effusion On spironolactone  25 mg once a day and furosemide  40 mg IV daily  E. coli bacteremia, Enterobacterales/E. coli on blood culture from 06/14/2024: On IV Zosyn   Plan: Discussed with patient and his wife present at bedside that worsening coagulopathy may signify liver failure. Since he came in with a blood alcohol level of 71 mg/dl and there is suspicion of ongoing alcohol use, he would not be a candidate for transplant evaluation. Will add Xifaxan  550 mg twice a day.  Since his left pleural effusion is reaccumulating, I will give him IV albumin  for 4 more doses, increase spironolactone  to 50 mg once a day and Lasix  to 40 mg IV twice daily. Will have to change this or decrease dosage if needle function worsens  tomorrow. Guarded prognosis  Estelita Manas, MD 06/16/2024, 1:19 PM

## 2024-06-16 NOTE — TOC Initial Note (Addendum)
 Transition of Care [facility]) - Initial/Assessment Note    Patient Details  Name: Jeffrey Mathis MRN: [id number] Date of Birth: August 09, 1980  [facility] CM/SW Contact:    Doneta Glenys DASEN, RN Phone Number: 06/16/2024, 12:20 PM  Clinical Narrative:   4:13 PM CM spoke with patient and wife in the room. Patient is agreeable to inpatient alcohol rehab. Informed them that I would give them resources to contract from the [facility]. Patient is aware that psych will come to do a consult.  3:02 PM [facility] - [facility] % service connected.  Provider - [facility] [phone]. [phone] Patients spouse Pardo,Alicia, [phone] requested to speak in the wall. Bernarda would like for patient to receive inpatient rehab for alcohol use. Explained that patient will have to be agreeable.  12:20 PM               CM called [facility] for services.Left Message        Patient Goals and CMS Choice            Expected Discharge Plan and Services                                              Prior Living Arrangements/Services                       Activities of Daily Living   ADL Screening (condition at time of admission) Independently performs ADLs?: Yes (appropriate for developmental age) Is the patient deaf or have difficulty hearing?: No Does the patient have difficulty seeing, even when wearing glasses/contacts?: No Does the patient have difficulty concentrating, remembering, or making decisions?: Yes  Permission Sought/Granted                  Emotional Assessment              Admission diagnosis:  Hypoxic respiratory failure (HCC) [J96.91] Patient Active Problem List   Diagnosis Date Noted   Coagulopathy 06/14/2024   Alcoholic cirrhosis of liver with ascites (HCC) 06/14/2024   Thrombocytopenia 06/14/2024   Hyperammonemia 06/14/2024   Pleural effusion 06/14/2024   Hypoxic respiratory failure (HCC) 06/14/2024   Anemia  06/14/2024   Prolonged QT interval 06/14/2024   Acute pancreatitis 04/30/2023   Transaminitis 04/30/2023   Hyperbilirubinemia 04/30/2023   Chronic alcoholic pancreatitis (HCC) 04/30/2023   Esophageal varices (HCC) 04/30/2023   Portal hypertension (HCC) 04/30/2023   Alcohol use disorder, severe, dependence (HCC) 06/07/2022   Sinus bradycardia 05/17/2021   Tobacco use 12/15/2020   Leukocytosis 12/12/2017   PCP:  [facility] Pharmacy:   [id number] - [place], [place] - [address] AT [facility] [address] [place] [address] Phone: [phone] Fax: [phone]     Social Drivers of Health (SDOH) Social History: SDOH Screenings   Food Insecurity: No Food Insecurity (06/14/2024)  Housing: High Risk (06/14/2024)  Transportation Needs: No Transportation Needs (06/14/2024)  Utilities: Not At Risk (06/14/2024)  Tobacco Use: High Risk (06/14/2024)   SDOH Interventions:     Readmission Risk Interventions    05/02/2023    9:51 AM  Readmission Risk Prevention Plan  Post Dischage Appt Complete  Medication Screening Complete  Transportation Screening Complete

## 2024-06-16 NOTE — Progress Notes (Signed)
 MEWS Progress Note  Patient Details Name: Jeffrey Mathis MRN: [id number] DOB: Oct 30, 1980 Today's Date: 06/16/2024   MEWS Flowsheet Documentation:  Assess: MEWS Score Temp: (!) 101.8 F (38.8 C) BP: (!) 116/59 MAP (mmHg): 76 Pulse Rate: (!) 120 Resp: 19 Level of Consciousness: Alert SpO2: 95 % O2 Device: Room Air O2 Flow Rate (L/min): 2 L/min Assess: MEWS Score MEWS Temp: 2 MEWS Systolic: 0 MEWS Pulse: 2 MEWS RR: 0 MEWS LOC: 0 MEWS Score: 4 MEWS Score Color: Red Assess: SIRS CRITERIA SIRS Temperature : 1 SIRS Respirations : 0 SIRS Pulse: 1 SIRS WBC: 0 SIRS Score Sum : 2 SIRS Temperature : 1 SIRS Pulse: 1 SIRS Respirations : 0 SIRS WBC: 0 SIRS Score Sum : 2 Assess: if the MEWS score is Yellow or Red Does the patient meet 2 or more of the SIRS criteria?: No Does the patient have a confirmed or suspected source of infection?: Yes MEWS guidelines implemented : Yes, red Treat MEWS Interventions: Considered administering scheduled or prn medications/treatments as ordered Take Vital Signs Increase Vital Sign Frequency : Red: Q1hr x2, continue Q4hrs until patient remains green for 12hrs Escalate MEWS: Escalate: Red: Discuss with charge nurse and notify provider. Consider notifying RRT. If remains red for 2 hours consider need for higher level of care Notify: Charge Nurse/RN Name of Charge Nurse/RN Notified: [facility] Provider Notification Provider Name/Title: CHRISTOBAL Laneta JAYSON Rennie 06/16/2024, 3:11 PM

## 2024-06-16 NOTE — Progress Notes (Signed)
 " PROGRESS NOTE Jeffrey Mathis  FMW:[id number] DOB: [birth year] DOA: 06/14/2024 PCP: [facility]  Brief Narrative/Hospital Course: Jeffrey Mathis is a 44 y.o. 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 In the ED he was noted to be hypoxic with oxygen saturations in the 80s which improved with oxygen.  He was noted to have a large pleural effusion on the left.  Additionally patient had a positive alcohol level, elevated lipase, elevated INR, elevated lactic acid, elevated ammonia, elevated LFTs with a markedly elevated T. bili and elevated lipase,  platelet count of 54 as well as hemoglobin of 6.7.  He was given 1 unit of packed red blood cells.  The patient notes black stools from time to time but no hematemesis or frank rectal bleeding.  Patient has known esophageal varices.  [facility] around [facility] however it appears he has a positive BAL here.  The wife reports 30 pound weight loss in the last 7 to 8 months which has mostly been muscle wasting Chest x-ray:Large left pleural effusion with Marked Severity. Chest abdomen pelvis with contrast> large left pleural effusion with near complete collapse of the left lung and mild mediastinal shift, cirrhosis with portal hypertension extensive gastroesophageal varices, mild splenomegaly, mild ascites hypertrophied hepatic artery, likely portal colopathy. Pt had bedside Thora done w/ 2L blood fluid aspirated.  Subjective: Seen and examined today. Patient has mild soreness otherwise no new complaints, feels a little stronger today His wife is at the bedside, tearful Overnight low-grade temp up to 100.4, vitals fairly stable on 2 L nasal cannula, hemoglobin dropped down to 6.02 unit PRBC has been ordered Labs-  Creat 0.6> 1.2>1 improving,  wbc  11.7  INR 2.9,  plt  49 TB 20, >22.5  Assessment and plan:  Large left pleural effusion/hepatic hydrothorax Acute hypoxic respiratory failure 2/2 above Hemothorax : Patient had small L effusion x 2 months ago now with tachycardia and significant effusion -hepatic hydrothorax  s/p thoracentesis with 2 L hemorrhagic fluid likely hemothorax from a spontaneous bleeding. F/u pleural fluid-72% neutrophils, Gram stain negative culture pending.PCCM following closely continue antibiotics, continue supplemental oxygen  Sepsis, E. coli bacteremia in BCID: Started on zosyn  1/12>> fu c/s drawn in the ED which was positive for E. coli and BC ID, suspected source likely intra-abdominal/related to cirrhosis Recent Labs  Lab 06/14/24 1335 06/14/24 1348 06/15/24 0400 06/16/24 0431  WBC 12.6*  --  8.7 11.7*  LATICACIDVEN  --  2.6*  --   --     Decompensated alcohol-related cirrhosis-with liver failure Coagulopathy Hyperbilirubinemia Portal hypertension/ascites/Esophageal varices: MELD NA 30 MELD 3.0 at 31> 32 -trend daily MELD labs With mild encephalopathy, along with coagulopathy indicating liver failure GI input appreciated - Cont IV Lasix , Aldactone  w/ albumin   support as  blood pressure tolerates. Continue alcohol withdrawal protocol folate thiamine  multivitamin, continue vitamin K Due to ongoing alcohol use will not be a candidate for liver transplant evaluation.  Overall his prognosis is poor, palliative care has been consulted Recent Labs  Lab 06/14/24 1335 06/14/24 1426 06/15/24 0400 06/16/24 0431  AST 125*  --  97* 71*  ALT 28  --  23 20  ALKPHOS 140*  --  111 100  BILITOT 18.1*  --  20.7* 24.8*  PROT 5.9*  --  5.1* 5.1*  ALBUMIN  2.9*  --  2.4* 2.9*  AMMONIA 38*  --   --   --   INR  --  2.0* 2.1* 2.9*  LIPASE 92*  --   --   --   PLT 54*  --  47* 49*      Latest Ref Rng & Units 10/30/2023    7:24 PM  Hepatitis  Hep B Surface Ag NON REACTIVE NON REACTIVE   Hep B IgM NON REACTIVE NON  REACTIVE   Hep C Ab NON REACTIVE NON REACTIVE   Hep A IgM NON REACTIVE NON REACTIVE    MELD 3.0: 32 at 06/16/2024  4:31 AM MELD-Na: 33 at 06/16/2024  4:31 AM Calculated from: Serum Creatinine: 1 mg/dL at 8/86/7973  5:68 AM Serum Sodium: 129 mmol/L at 06/16/2024  4:31 AM Total Bilirubin: 24.8 mg/dL at 8/86/7973  5:68 AM Serum Albumin : 2.9 g/dL at 8/86/7973  5:68 AM INR(ratio): 2.9 at 06/16/2024  4:31 AM Age at listing (hypothetical): 43 years Sex: Male at 06/16/2024  4:31 AM   ABLA on chronic anemia: Due to hemothorax also with coagulopathy given cirrhosis. ? Esophageal varices contributing.  GI following, continue to transfuse to keep hemoglobin above 7 g.  2 unit PRBC ordered this morning Recent Labs  Lab 06/14/24 1335 06/15/24 0400 06/15/24 1240 06/16/24 0431  HGB 6.7* 7.9* 7.4* 6.0*  HCT 19.5* 21.9* 20.6* 17.0*    Chronic alcoholic pancreatitis: Lipase is mildly elevated at 92.Cont diet as tolerated.  Alcohol use disorder, severe, dependence: Ongoing use reports he relapsed, noted blood alcohol level.  Continue CIWA scale Ativan  thiamine  folate   Prolonged QT interval: Avoid QT prolonging agents.  Mild hepatic encephalopathy Hyperammonemia: Cont lactulose   Thrombocytopenia: From liver cirrhosis monitor.  Recent Labs  Lab 06/14/24 1335 06/15/24 0400 06/16/24 0431  PLT 54* 47* 49*   Hyponatremia: likely from cirrhosis, monitor.  Tobacco use: Patient offered and declined nicotine patch  Severe malnutrition with Body mass index is 18.21 kg/m.: Augment diet as able.  DVT prophylaxis: SCDs Start: 06/14/24 1804 Code Status:   Code Status: Full Code Family Communication: plan of care discussed with patient and his wife at bedside. Patient status is: Remains hospitalized because of severity of illness Level of care: Med-Surg   Dispo: The patient is from: home            Anticipated disposition: TBD Objective: Vitals last 24 hrs: Vitals:   06/16/24 0827  06/16/24 1209 06/16/24 1210 06/16/24 1230  BP: (!) 128/59 (!) 108/53 108/83 (!) 110/51  Pulse: (!) 122 (!) 123 (!) 123 (!) 117  Resp: 18  16   Temp: 98.2 F (36.8 C) 99 F (37.2 C) 99 F (37.2 C) 99.3 F (37.4 C)  TempSrc:   Oral   SpO2: 94% 94%  99%  Weight:      Height:        Physical Examination: General exam: AAO oriented, LH discolored skin HEENT:Oral mucosa moist, Ear/Nose WNL grossly Respiratory system: Bilaterally clear BS,no use of accessory muscle Cardiovascular system: S1 & S2 +, No JVD. Gastrointestinal system: Abdomen soft,NT,ND, BS+ Nervous System: Alert, awake, moving all extremities,and following commands. Extremities: extremities warm, leg edema mild Skin: Warm, no rashes, icterus+++ MSK: Cachectic   Medications reviewed:  Scheduled Meds:  sodium chloride    Intravenous Once   feeding supplement  237 mL Oral BID BM   folic acid   1 mg Oral Daily   furosemide   40 mg Intravenous Daily   lactulose   20 g Oral BID  multivitamin with minerals  1 tablet Oral Daily   pantoprazole   40 mg Oral QHS   phytonadione   5 mg Oral Daily   potassium chloride   20 mEq Oral Daily   sodium chloride  flush  3 mL Intravenous Q12H   spironolactone   25 mg Oral Daily   thiamine   100 mg Oral Daily   Or   thiamine   100 mg Intravenous Daily   Continuous Infusions:  piperacillin -tazobactam (ZOSYN )  IV 3.375 g (06/16/24 9380)   Diet: Diet Order             Diet Heart Room service appropriate? Yes; Fluid consistency: Thin  Diet effective now                  Data Reviewed: I have personally reviewed following labs and imaging studies ( see epic result tab) CBC: Recent Labs  Lab 06/14/24 1335 06/15/24 0400 06/15/24 1240 06/16/24 0431  WBC 12.6* 8.7  --  11.7*  NEUTROABS 10.0*  --   --   --   HGB 6.7* 7.9* 7.4* 6.0*  HCT 19.5* 21.9* 20.6* 17.0*  MCV 111.4* 100.5*  --  101.2*  PLT 54* 47*  --  49*   CMP: Recent Labs  Lab 06/14/24 1335 06/15/24 0400  06/16/24 0431  NA 134* 131* 129*  K 4.3 4.4 3.8  CL 98 97* 97*  CO2 23 23 23   GLUCOSE 121* 129* 129*  BUN 8 19 25*  CREATININE 0.61 1.22 1.00  CALCIUM 8.4* 8.2* 8.4*  MG 1.6*  --   --   PHOS 3.7  --   --    GFR: Estimated Creatinine Clearance: 86.1 mL/min (by C-G formula based on SCr of 1 mg/dL). Recent Labs  Lab 06/14/24 1335 06/15/24 0400 06/16/24 0431  AST 125* 97* 71*  ALT 28 23 20   ALKPHOS 140* 111 100  BILITOT 18.1* 20.7* 24.8*  PROT 5.9* 5.1* 5.1*  ALBUMIN  2.9* 2.4* 2.9*    Recent Labs  Lab 06/14/24 1335  LIPASE 92*    Recent Labs  Lab 06/14/24 1335  AMMONIA 38*   Coagulation Profile:  Recent Labs  Lab 06/14/24 1426 06/15/24 0400 06/16/24 0431  INR 2.0* 2.1* 2.9*   Unresulted Labs (From admission, onward)     Start     Ordered   06/16/24 0500  Comprehensive metabolic panel with GFR  Daily,   R     Question:  Specimen collection method  Answer:  Lab=Lab collect   06/15/24 0718   06/16/24 0500  CBC  Daily,   R     Question:  Specimen collection method  Answer:  Lab=Lab collect   06/15/24 0718   06/16/24 0500  Protime-INR  Daily,   R     Question:  Specimen collection method  Answer:  Lab=Lab collect   06/15/24 0718   06/14/24 2251  Glucose, Body Fluid Other  Once,   R        06/14/24 2251   06/14/24 2251  LD, Body Fluid (other)  Once,   R        06/14/24 2251   06/14/24 2251  Protein, body fluid (other)  Once,   R        06/14/24 2251   06/14/24 2244  Triglycerides, Body Fluid  (Thoracentesis Labs Panel)  Once,   R        06/14/24 2243          Antimicrobials/Microbiology: Anti-infectives (From admission, onward)  Start     Dose/Rate Route Frequency Ordered Stop   06/15/24 1230  piperacillin -tazobactam (ZOSYN ) IVPB 3.375 g        3.375 g 12.5 mL/hr over 240 Minutes Intravenous Every 8 hours 06/15/24 1132           Component Value Date/Time   SDES  06/14/2024 2252    PLEURAL Performed at [facility], [address] [place], [place] [address]    SPECREQUEST  06/14/2024 2252    NONE Performed at [facility], [address] [place], [place] [address]    CULT  06/14/2024 2252    NO GROWTH 1 DAY Performed at [facility], [address], [place], [place] [address]    REPTSTATUS PENDING 06/14/2024 2252    Procedures:  Mennie LAMY, MD [facility] 06/16/2024, 12:41 PM   "

## 2024-06-16 NOTE — Progress Notes (Signed)
" ° °  NAME:  Jeffrey Mathis, MRN:  [id number], DOB:  1980-07-03, LOS: 2 ADMISSION DATE:  06/14/2024, CONSULTATION DATE:  06/14/24 REFERRING MD:  Fredirick, CHIEF COMPLAINT:  SOB   History of Present Illness:  44 year old man with what appears to be end stage alcoholic liver cirrhosis followed at [facility] who presents with months of weight loss, weakness, and SOB.  Workup reveals AOC anemia, decompensated cirrhosis and large pleural effusion with tension features on imaging.  Pulmonary is consulted.  Patient is a poor historian so am relying on wife: she states breathing has been tough for him for very long time.  No recent trauma, no infectious symptoms.  No cough.  28 pack year smoker.  Pertinent  Medical History  ETOH cirrhosis  Significant Hospital Events: Including procedures, antibiotic start and stop dates in addition to other pertinent events   1/11 admit, left thoracentesis done with 2000 cc of bloody appearing fluid  Interim History / Subjective:   Looks a little stronger today No new complaints  Objective    Blood pressure (!) 128/59, pulse (!) 122, temperature 98.2 F (36.8 C), resp. rate 18, height 6' 1 (1.854 m), weight 62.6 kg, SpO2 94%.        Intake/Output Summary (Last 24 hours) at 06/16/2024 1056 Last data filed at 06/16/2024 1015 Gross per 24 hour  Intake 109.64 ml  Output 1220 ml  Net -1110.36 ml   Filed Weights   06/14/24 1813  Weight: 62.6 kg    Examination: Ill-appearing malnourished man, jaundiced Awake, oriented Diminished breath sounds at the bases Abdomen with mild tenderness Extremities with no edema  Labs/Imaging reviewed Significant for sodium 129, BUN/creatinine 25/1.00, total bilirubin 24.8 AST 71, ALT 20 Hemoglobin dropped to 6.0, WBC 11.7, platelets 49  Pleural studies-5756 nucleated cells, 74% neutrophils  CT chest abdomen pelvis reviewed with large left effusion with near collapse of left lung, cirrhosis with portal hypertension, thickening  of cecum and ascending colon  Resolved problem list   Assessment and Plan  Large left pleural effusion status postthoracentesis Appears hemorrhagic and may be old hemothorax from spontaneous bleeding.  Denies any trauma Continue to monitor chest x-ray Follow pleural studies  Sepsis, E. coli bacteremia Started on Zosyn .  Follow final sensitivities.  End-stage cirrhosis, portal hypertension Possible GI bleed GI is on board.  Continue PPI  Signature:   Muaz Shorey MD [facility] See Amion for pager  If no response to pager , please call [phone] until 7pm After 7:00 pm call Elink  [phone] 06/16/2024, 10:56 AM         "

## 2024-06-17 ENCOUNTER — Encounter (HOSPITAL_COMMUNITY): Payer: Self-pay | Admitting: *Deleted

## 2024-06-17 DIAGNOSIS — Z7189 Other specified counseling: Secondary | ICD-10-CM

## 2024-06-17 DIAGNOSIS — Z515 Encounter for palliative care: Secondary | ICD-10-CM

## 2024-06-17 DIAGNOSIS — K7031 Alcoholic cirrhosis of liver with ascites: Secondary | ICD-10-CM

## 2024-06-17 DIAGNOSIS — R4589 Other symptoms and signs involving emotional state: Secondary | ICD-10-CM

## 2024-06-17 LAB — COMPREHENSIVE METABOLIC PANEL WITH GFR
ALT: 23 U/L (ref 0–44)
AST: 72 U/L — ABNORMAL HIGH (ref 15–41)
Albumin: 3.6 g/dL (ref 3.5–5.0)
Alkaline Phosphatase: 88 U/L (ref 38–126)
Anion gap: 15 (ref 5–15)
BUN: 14 mg/dL (ref 6–20)
CO2: 23 mmol/L (ref 22–32)
Calcium: 8.8 mg/dL — ABNORMAL LOW (ref 8.9–10.3)
Chloride: 94 mmol/L — ABNORMAL LOW (ref 98–111)
Creatinine, Ser: 0.9 mg/dL (ref 0.61–1.24)
GFR, Estimated: >60 mL/min
Glucose, Bld: 141 mg/dL — ABNORMAL HIGH (ref 70–99)
Potassium: 3 mmol/L — ABNORMAL LOW (ref 3.5–5.1)
Sodium: 132 mmol/L — ABNORMAL LOW (ref 135–145)
Total Bilirubin: 28.3 mg/dL (ref 0.0–1.2)
Total Protein: 5.7 g/dL — ABNORMAL LOW (ref 6.5–8.1)

## 2024-06-17 LAB — CULTURE, BLOOD (ROUTINE X 2): Special Requests: ADEQUATE

## 2024-06-17 LAB — CBC
HCT: 22.4 % — ABNORMAL LOW (ref 39.0–52.0)
Hemoglobin: 8.1 g/dL — ABNORMAL LOW (ref 13.0–17.0)
MCH: 34.9 pg — ABNORMAL HIGH (ref 26.0–34.0)
MCHC: 36.2 g/dL — ABNORMAL HIGH (ref 30.0–36.0)
MCV: 96.6 fL (ref 80.0–100.0)
Platelets: 52 K/uL — ABNORMAL LOW (ref 150–400)
RBC: 2.32 MIL/uL — ABNORMAL LOW (ref 4.22–5.81)
RDW: 20.4 % — ABNORMAL HIGH (ref 11.5–15.5)
WBC: 10.6 K/uL — ABNORMAL HIGH (ref 4.0–10.5)
nRBC: 0.3 % — ABNORMAL HIGH (ref 0.0–0.2)

## 2024-06-17 LAB — PROTIME-INR
INR: 2.4 — ABNORMAL HIGH (ref 0.8–1.2)
Prothrombin Time: 27.2 s — ABNORMAL HIGH (ref 11.4–15.2)

## 2024-06-17 LAB — HEMOGLOBIN AND HEMATOCRIT, BLOOD
HCT: 22.4 % — ABNORMAL LOW (ref 39.0–52.0)
Hemoglobin: 8.1 g/dL — ABNORMAL LOW (ref 13.0–17.0)

## 2024-06-17 MED ORDER — POTASSIUM CHLORIDE CRYS ER 20 MEQ PO TBCR
40.0000 meq | EXTENDED_RELEASE_TABLET | Freq: Once | ORAL | Status: AC
  Administered 2024-06-17: 40 meq via ORAL
  Filled 2024-06-17: qty 2

## 2024-06-17 MED ORDER — SODIUM CHLORIDE 0.9 % IV SOLN
2.0000 g | Freq: Every day | INTRAVENOUS | Status: DC
  Administered 2024-06-17 – 2024-06-19 (×3): 2 g via INTRAVENOUS
  Filled 2024-06-17 (×3): qty 20

## 2024-06-17 MED ORDER — METRONIDAZOLE 500 MG PO TABS
500.0000 mg | ORAL_TABLET | Freq: Two times a day (BID) | ORAL | Status: DC
  Administered 2024-06-17 – 2024-06-19 (×5): 500 mg via ORAL
  Filled 2024-06-17 (×5): qty 1

## 2024-06-17 MED ORDER — SPIRONOLACTONE 25 MG PO TABS
100.0000 mg | ORAL_TABLET | Freq: Every day | ORAL | Status: DC
  Administered 2024-06-17 – 2024-06-19 (×3): 100 mg via ORAL
  Filled 2024-06-17 (×3): qty 4

## 2024-06-17 NOTE — Consult Note (Signed)
 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  During hospitalization, patient has received management for decompensated alcohol-related cirrhosis with associated liver failure, coagulopathy, hyperbilirubinemia, portal hypertension/ascites/esophageal varices, E. coli bacteremia, and large left pleural effusion concerning for hemothorax.  GI and pulmonologist consulted for recommendations.  Palliative medicine team consulted to assist with goals of care discussions.  Reviewed hospitalist documentation from 06/16/2024 and noted continued aggressive medical management.  Reviewed documentation from GI and pulmonologist for today.  GI has provided recommendations regarding adjustments of medications including increase of spironolactone  and continuing furosemide  and Xifaxan  and lactulose .  GI has already informed patient that with his ongoing alcohol use, he would not be a candidate for transplant evaluation.  Pulmonologist today noted patient is getting blood transfusions daily.  Pulmonologist reviewed x-ray showing worsening effusion on the left.  Continuing to increase diuretics as tolerated to mobilize fluid though would perform thoracentesis if there was respiratory distress.  Patient continues management with IV antibiotics for E. coli bacteremia. Reviewed patient's vital signs which overnight into today noted fever with temperature up to 101.4 F.  Reviewed CMP from today noting patient's sodium low at 132, potassium low  at 3, and total bilirubin increased up to 28.3.  Also reviewed hemoglobin measured today which was 8.1 after transfusion.  Patient's INR today noted to be 2.4.  Personally reviewed chest x-ray from 06/16/2024 noting increase in left pleural effusion.  Presented to bedside to see patient.  Patient's wife and friend present at bedside though wife asked for them to step out for discussion.  Patient gave permission to speak with him and his wife.  Introduced myself as a member of the palliative medicine team and my role in patient's medical journey.  Spent time learning about medical updates patient and wife had heard.  Wife contributed to providing history.  Patient acknowledges that he has heard he would need a liver transplant at this time though is not a candidate.  Acknowledged this. With permission, able to further discuss the severity of patient's end-stage cirrhosis.  Reviewed patient's lab work for today noting that patient's total bilirubin has worsened.  Noted medical team's have continued aggressive medical interventions and despite this patient overall still not doing well.  Discussed how patient is needing daily transfusions for management of his hemoglobin with his anemia.  Patient and wife acknowledged this. Inquired if patient and wife had discussed what patient's wishes regarding aggressive medical interventions would be with his underlying severe disease.  Patient noted they had not.  Inquired if patient was unable to make decisions for himself who would he want to make decisions for him.  Patient did state he would want his wife, Jeffrey Mathis, to make medical decisions on his behalf.  With permission also able to discuss CODE STATUS.  Explained full code.  Expressed concern that if patient were to become very ill to the point his heart were to stop or he were to stop breathing, worried that chest compressions and intubation with mechanical ventilation would not reverse  patient's underlying alcoholic  cirrhosis and could cause suffering while not allowing patient to get back to a good quality of life.  Noted that patient can be DNR and will continue with appropriate medical interventions.  Also explained that should patient remain full code and ever end up receiving CPR or being placed on ventilator, need to discuss wishes at this time because his wife would ultimately have to make the decision for palliative extubation/medical management with patient's severe illness.  Patient and wife acknowledged this and agreed they would discuss this further.  Answered all questions as able at that time.  Also expressed that currently patient cannot survive outside of the hospital as he is requiring frequent transfusions.  Noted if this stabilizes and can get to a point where it seems patient is appropriate for discharge, they need to consider what kind of medical care patient would want moving forward.  Would patient want to continue rehospitalizations and aggressive medical interventions or consider focusing on his comfort and time at home with family.  Did not discuss this in detail at this currently patient is not stable for discharge though noted these were all pathways patient and his wife should consider.  Did inquire about patient's symptom burden as well.  Patient denies having any pain and notes he only occasionally has nausea which is controlled by eating smaller meals or taking smaller sips.  Noted that if patient develops further symptom burden, could inform this provider to adjust medications as needed.  Spent time providing emotional support via active listening.  All questions answered at that time.  Noted palliative medicine team will continue to follow along with patient's medical journey.  Discussed care with hospitalist, pulmonologist, GI, and RN after visit to coordinate care.  Primary Diagnoses  Present on Admission:  Chronic alcoholic pancreatitis (HCC)  Alcohol use disorder, severe,  dependence (HCC)  Tobacco use  Transaminitis  Hyperbilirubinemia  Esophageal varices (HCC)  Portal hypertension (HCC)  Coagulopathy  Alcoholic cirrhosis of liver with ascites (HCC)  Thrombocytopenia  Hyperammonemia  (Resolved) Hypoxia  Pleural effusion  Hypoxic respiratory failure (HCC)  Anemia  Prolonged QT interval   Palliative Review of Systems: Denies any pain, noted only occasional nausea  Past Medical History:  Diagnosis Date   Pancreatitis    PTSD (post-traumatic stress disorder)    PTSD (post-traumatic stress disorder)    Social History   Socioeconomic History   Marital status: Married    Spouse name: Not on file   Number of children: Not on file   Years of education: Not on file   Highest education level: Not on file  Occupational History   Not on file  Tobacco Use   Smoking status: Every Day    Current packs/day: 0.15    Types: Cigarettes   Smokeless tobacco: Never  Vaping Use   Vaping status: Never Used  Substance and Sexual Activity   Alcohol use: Not Currently   Drug use: Yes    Comment: CBD use also   Sexual activity: Not on file  Other Topics Concern   Not on file  Social History Narrative   Not on file   Social Drivers of Health   Tobacco Use: High Risk (06/14/2024)   Patient History    Smoking Tobacco Use: Every Day    Smokeless Tobacco Use: Never    Passive Exposure: Not on file  Financial Resource Strain: Not on file  Food Insecurity: No Food Insecurity (06/14/2024)   Epic    Worried  About Running Out of Food in the Last Year: Never true    Ran Out of Food in the Last Year: Never true  Transportation Needs: No Transportation Needs (06/14/2024)   Epic    Lack of Transportation (Medical): No    Lack of Transportation (Non-Medical): No  Physical Activity: Not on file  Stress: Not on file  Social Connections: Not on file  Depression (EYV7-0): Not on file  Alcohol Screen: Not on file  Housing: High Risk (06/14/2024)   Epic     Unable to Pay for Housing in the Last Year: Yes    Number of Times Moved in the Last Year: 0    Homeless in the Last Year: No  Utilities: Not At Risk (06/14/2024)   Epic    Threatened with loss of utilities: No  Health Literacy: Not on file   Family History  Problem Relation Age of Onset   Cancer Mother    Bipolar disorder Mother    Cancer Father    Pancreatitis Father    Scheduled Meds:  sodium chloride    Intravenous Once   feeding supplement  237 mL Oral BID BM   folic acid   1 mg Oral Daily   furosemide   40 mg Intravenous Q12H   lactulose   20 g Oral BID   multivitamin with minerals  1 tablet Oral Daily   pantoprazole   40 mg Oral QHS   phytonadione   5 mg Oral Daily   potassium chloride   20 mEq Oral Daily   rifaximin   550 mg Oral BID   sodium chloride  flush  3 mL Intravenous Q12H   spironolactone   50 mg Oral Daily   thiamine   100 mg Oral Daily   Or   thiamine   100 mg Intravenous Daily   Continuous Infusions:  albumin  human 25 g (06/17/24 0329)   piperacillin -tazobactam (ZOSYN )  IV 3.375 g (06/16/24 2231)   PRN Meds:.acetaminophen , ibuprofen , LORazepam  **OR** LORazepam , oxyCODONE , polyethylene glycol, promethazine , sodium chloride  flush Allergies[1] CBC:    Component Value Date/Time   WBC 10.6 (H) 06/17/2024 0441   HGB 8.1 (L) 06/17/2024 0441   HCT 22.4 (L) 06/17/2024 0441   PLT 52 (L) 06/17/2024 0441   MCV 96.6 06/17/2024 0441   NEUTROABS 10.0 (H) 06/14/2024 1335   LYMPHSABS 1.3 06/14/2024 1335   MONOABS 1.1 (H) 06/14/2024 1335   EOSABS 0.0 06/14/2024 1335   BASOSABS 0.2 (H) 06/14/2024 1335   Comprehensive Metabolic Panel:    Component Value Date/Time   NA 132 (L) 06/17/2024 0441   K 3.0 (L) 06/17/2024 0441   CL 94 (L) 06/17/2024 0441   CO2 23 06/17/2024 0441   BUN 14 06/17/2024 0441   CREATININE 0.90 06/17/2024 0441   GLUCOSE 141 (H) 06/17/2024 0441   CALCIUM 8.8 (L) 06/17/2024 0441   AST 72 (H) 06/17/2024 0441   ALT 23 06/17/2024 0441   ALKPHOS 88  06/17/2024 0441   BILITOT 28.3 (HH) 06/17/2024 0441   PROT 5.7 (L) 06/17/2024 0441   ALBUMIN  3.6 06/17/2024 0441    Physical Exam: Vital Signs: BP 117/67   Pulse (!) 116   Temp (!) 101.4 F (38.6 C) (Oral)   Resp 18   Ht 6' 1 (1.854 m)   Wt 62.6 kg   SpO2 98%   BMI 18.21 kg/m  SpO2: SpO2: 98 % O2 Device: O2 Device: Room Air O2 Flow Rate: O2 Flow Rate (L/min): 2 L/min Intake/output summary:  Intake/Output Summary (Last 24 hours) at 06/17/2024 0700 Last data filed  at 06/17/2024 0203 Gross per 24 hour  Intake 1069 ml  Output 950 ml  Net 119 ml   LBM: Last BM Date : 06/16/24 Baseline Weight: Weight: 62.6 kg Most recent weight: Weight: 62.6 kg  General: NAD, alert, chronically ill-appearing, cachectic, frail Eyes: Scleral icterus Cardiovascular: Tachycardia noted Respiratory: no increased work of breathing noted, not in respiratory distress Abdomen: distended Skin: Jaundiced Neuro: A&Ox4, following commands easily Psych: appropriately answers all questions         Palliative Performance Scale: 40%              Additional Data Reviewed: Recent Labs    06/16/24 0431 06/16/24 1902 06/17/24 0441  WBC 11.7*  --  10.6*  HGB 6.0* 6.6* 8.1*  PLT 49*  --  52*  NA 129*  --  132*  BUN 25*  --  14  CREATININE 1.00  --  0.90    Imaging: DG CHEST PORT 1 VIEW EXAM: 1 VIEW(S) XRAY OF THE CHEST 06/16/2024 06:29:00 AM  COMPARISON: 06/15/2024  CLINICAL HISTORY: Hemothorax  FINDINGS:  LUNGS AND PLEURA: Moderate left pleural effusion, increased from prior. Hazy opacity throughout left lung, increased from prior, which likely represents a combination of posteriorly layering pleural fluid and atelectasis. No pneumothorax.  HEART AND MEDIASTINUM: No acute abnormality of the cardiac and mediastinal silhouettes.  BONES AND SOFT TISSUES: No acute osseous abnormality.  IMPRESSION: 1. Moderate left pleural effusion, increased from prior. 2. Hazy opacity throughout the  left lung, increased from prior, likely representing a combination of posteriorly layering pleural fluid and atelectasis.  Electronically signed by: Waddell Calk MD MD 06/16/2024 08:35 AM EST RP Workstation: [id number]    I personally reviewed recent imaging.   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  During hospitalization, patient has received management for decompensated alcohol-related cirrhosis with associated liver failure, coagulopathy, hyperbilirubinemia, portal hypertension/ascites/esophageal varices, E. coli bacteremia, and large left pleural effusion concerning for hemothorax.  GI and pulmonologist consulted for recommendations.  Palliative medicine team consulted to assist with goals of care discussions.  # Complex medical decision making/goals of care  - Discussed care with patient while wife at bedside as detailed above in HPI.  Spent time explaining severity of patient's underlying medical illness.  Expressed concern about patient's prognosis as currently requiring daily transfusions for management; also expressed concern about patient being able to survive outside of the hospital at this time.  Encouraged patient and wife to further discuss what kind of medical care patient would find appropriate based on what he sees as his quality of life.  At this time continuing aggressive medical interventions.  Noted palliative medicine will continue to follow along with patient's medical journey.  -  Code Status: Full Code    - Discussed CODE STATUS with patient while wife at bedside as detailed above in HPI.  Expressed concern that if patient were sick and if his heart were to stop or he were to  stop breathing, interventions such as cardiac resuscitation and intubation with mechanical ventilation would not reverse patient's underlying liver cirrhosis and may not provide patient returning to a quality of life he sees fit if you survived.  Currently remains full code.  # Psycho-social/Spiritual Support:  -  Support System: Wife - Inquired about patient and wife desiring chaplain support which they noted they do not require at this time.  Noted would be available if needed for emotional support.  # Discharge Planning:  To Be Determined  Thank you for allowing the palliative care team to participate in the care [name].  Tinnie Radar, DO Palliative Care Provider PMT # [phone]  If patient remains symptomatic despite maximum doses, please call PMT at [phone] between 0700 and 1900. Outside of these hours, please call attending, as PMT does not have night coverage.  Billing based on MDM: High   Problems Addressed: One or more chronic illnesses with severe exacerbation, progression, or side effects of treatment.  Amount and/or Complexity of Data: Category 1:Review of prior external note(s) from each unique source, Review of the result(s) of each unique test, and Assessment requiring an independent historian(s) and Category 3:Discussion of management or test interpretation with external physician/other qualified health care professional/appropriate source (not separately reported)      [1] No Known Allergies  "

## 2024-06-17 NOTE — Progress Notes (Signed)
 " PROGRESS NOTE  Jeffrey Mathis  DOB: [birth year]  [facility] FMW:[id number]  DOA: 06/14/2024  LOS: 3 days  Hospital Day: 4  Subjective: Patient was seen and examined this morning. Young Caucasian male.  Looks much older than his stated age. Wife at bedside.  Patient just had a palliative care conversation with Dr. Clayton.  Wife was emotional at bedside.  In the last 24 hours, he has been spiking persistent fever as high as 101.6, tachycardic to 110s, Blood pressure in 100s, breathing on room air.  Labs from this morning with WC count 10.6, hemoglobin 8.1, platelet 152, INR 2.4, sodium 132, potassium 3, bilirubin significantly elevated to 28.  Brief narrative: Jeffrey Mathis is a 44 y.o. male with PMH significant for alcoholic liver cirrhosis, esophageal varices ongoing alcohol use, pancreatitis, PTSD He gets his usual care at [facility] and was recently referred to [facility] for possible need of liver transplant.  1/11, patient presented to ED with shortness of breath, worsening jaundice, intermittent melena and 30 pounds weight loss in 7 to 8 months.  In the ED, his oxygen saturation was low in 80s. Labs showed elevated alcohol level, lipase, INR, lactic acid, ammonia, LFTs with markedly elevated T. bili, low hemoglobin and low platelets. Chest x-ray showed large left pleural effusion Chest abdomen pelvis showed large left pleural effusion with near complete collapse of the left lung and mild mediastinal shift, mild ascites. PCCM did a bedside thoracentesis and aspirated 2 L of blood tinged fluid Was given blood transfusion. Admitted to [facility] PCCM, GI were consulted  In the last 24 hours, he has been spiking persistent fever as high as 101.6, tachycardic to 110s,  Assessment and plan: Large left pleural effusion hepatic hydrothorax/hemothorax Acute hypoxic respiratory failure  Presented with shortness of breath  Initial imagings as above showed large pleural effusion   Underwent bedside thoracentesis by PCCM and got 2 L blood-tinged fluid drained Fluid analysis showed 72% neutrophils, Gram stain culture pending PCCM following Continue antibiotics Continue supplemental oxygen Recent Labs  Lab 06/14/24 1335 06/14/24 1348 06/15/24 0400 06/16/24 0431 06/17/24 0441  WBC 12.6*  --  8.7 11.7* 10.6*  LATICACIDVEN  --  2.6*  --   --   --     Sepsis POA E. coli bacteremia Fever spikes Blood culture sent on admission grew E. coli, pansensitive Likely intra-abdominal source In the last 24 hours, he has been spiking persistent fever as high as 101.6, tachycardic to 110s Discussed with pharmacy.  Switch antibiotic to IV Rocephin  and IV Flagyl  Continue to monitor  Decompensated alcoholic liver cirrhosis Portal hypertension/ascites/Esophageal varices: Coagulopathy-thrombocytopenia, elevated INR Hyperammonemia GI following MELD sodium score 31, INR elevated over 2, platelet level running low Renal function stable Currently getting diuresed with IV Lasix  40 mg twice daily, Aldactone  100 mg daily Also continued on lactulose  20 mg twice daily, Xifaxan  550 mg twice daily, folic acid , multivitamin, thiamine  Total bili rising up. Due to ongoing alcohol use will not be a candidate for liver transplant evaluation.  Overall his prognosis is poor, palliative care has been consulted Recent Labs  Lab 06/14/24 1335 06/14/24 1426 06/15/24 0400 06/16/24 0431 06/17/24 0441  AST 125*  --  97* 71* 72*  ALT 28  --  23 20 23   ALKPHOS 140*  --  111 100 88  BILITOT 18.1*  --  20.7* 24.8* 28.3*  PROT 5.9*  --  5.1* 5.1* 5.7*  ALBUMIN  2.9*  --  2.4* 2.9* 3.6  AMMONIA  38*  --   --   --   --   INR  --  2.0* 2.1* 2.9* 2.4*  LIPASE 92*  --   --   --   --   PLT 54*  --  47* 49* 52*   Acute on chronic blood loss anemia H/o esophageal varices Hemoglobin dropped to the lowest of 6 on 1/13, improved after blood transfusion.  8.1 this morning. Acute blood loss likely  secondary to hemothorax.  FOBT negative Continue Protonix  Received total of 4 units PRBC transfusion so far Recent Labs    06/15/24 1240 06/16/24 0431 06/16/24 1902 06/17/24 0441 06/17/24 1102  HGB 7.4* 6.0* 6.6* 8.1* 8.1*  MCV  --  101.2*  --  96.6  --    Ongoing chronic alcohol use  Had elevated blood alcohol level at presentation continue CIWA scale, Ativan  thiamine  folate Counseled to quit  Chronic alcoholic pancreatitis Lipase is mildly elevated at 92. Cont diet as tolerated.   Hyponatremia Mild.  Likely from cirrhosis, diuresis. Continue to monitor Recent Labs  Lab 06/14/24 1335 06/15/24 0400 06/16/24 0431 06/17/24 0441  NA 134* 131* 129* 132*  K 4.3 4.4 3.8 3.0*  CL 98 97* 97* 94*  CO2 23 23 23 23   GLUCOSE 121* 129* 129* 141*  BUN 8 19 25* 14  CREATININE 0.61 1.22 1.00 0.90  CALCIUM 8.4* 8.2* 8.4* 8.8*  MG 1.6*  --   --   --   PHOS 3.7  --   --   --    Tobacco use: Patient offered and declined nicotine patch   Severe malnutrition with  Body mass index is 18.21 kg/m. Augment diet as able.   Mobility: encourage ambulation  PT Orders:   PT Follow up Rec:     Goals of care   Code Status: Full Code     DVT prophylaxis:  SCDs Start: 06/14/24 1804   Antimicrobials: IV Rocephin , Flagyl  Fluid: None Consultants: GI, pulmonary, palliative care Family Communication: Wife at bedside  Status: Inpatient Level of care:  Med-Surg   Patient is from: Home Needs to continue in-hospital care: Poor prognosis.  Anticipated d/c to: Pending clinical course      Diet:  Diet Order             Diet Heart Room service appropriate? Yes; Fluid consistency: Thin  Diet effective now                   Scheduled Meds:  sodium chloride    Intravenous Once   feeding supplement  237 mL Oral BID BM   folic acid   1 mg Oral Daily   furosemide   40 mg Intravenous Q12H   lactulose   20 g Oral BID   metroNIDAZOLE   500 mg Oral Q12H   multivitamin with  minerals  1 tablet Oral Daily   pantoprazole   40 mg Oral QHS   potassium chloride   20 mEq Oral Daily   rifaximin   550 mg Oral BID   sodium chloride  flush  3 mL Intravenous Q12H   spironolactone   100 mg Oral Daily   thiamine   100 mg Oral Daily   Or   thiamine   100 mg Intravenous Daily    PRN meds: acetaminophen , LORazepam  **OR** LORazepam , oxyCODONE , polyethylene glycol, promethazine , sodium chloride  flush   Infusions:   albumin  human 25 g (06/17/24 1113)   cefTRIAXone  (ROCEPHIN )  IV 2 g (06/17/24 1112)    Antimicrobials: Anti-infectives (From admission, onward)    Start  Dose/Rate Route Frequency Ordered Stop   06/17/24 1000  metroNIDAZOLE  (FLAGYL ) tablet 500 mg        500 mg Oral Every 12 hours 06/17/24 0859     06/17/24 0945  cefTRIAXone  (ROCEPHIN ) 2 g in sodium chloride  0.9 % 100 mL IVPB        2 g 200 mL/hr over 30 Minutes Intravenous Daily 06/17/24 0859     06/16/24 1800  rifaximin  (XIFAXAN ) tablet 550 mg        550 mg Oral 2 times daily 06/16/24 1329     06/15/24 1230  piperacillin -tazobactam (ZOSYN ) IVPB 3.375 g  Status:  Discontinued        3.375 g 12.5 mL/hr over 240 Minutes Intravenous Every 8 hours 06/15/24 1132 06/17/24 0859       Objective: Vitals:   06/17/24 0800 06/17/24 1314  BP: (!) 100/48 106/69  Pulse: 99 94  Resp: 18 17  Temp: 98.1 F (36.7 C) 98.4 F (36.9 C)  SpO2: 95% 98%    Intake/Output Summary (Last 24 hours) at 06/17/2024 1406 Last data filed at 06/17/2024 0800 Gross per 24 hour  Intake 1118 ml  Output 350 ml  Net 768 ml   Filed Weights   06/14/24 1813  Weight: 62.6 kg   Weight change:  Body mass index is 18.21 kg/m.   Physical Exam: General exam: Pleasant, young Caucasian male.  Looks older than his stated age Skin: No rashes, lesions or ulcers. HEENT: Atraumatic, normocephalic, no obvious bleeding Lungs: Clear to auscultation bilaterally,  CVS: S1, S2, no murmur,   GI/Abd: Soft, nontender, nondistended, bowel sound  present,   CNS: Alert, awake, oriented x 3 Psychiatry: Sad affect Extremities: No pedal edema, no calf tenderness,   Data Review: I have personally reviewed the laboratory data and studies available.  F/u labs ordered Unresulted Labs (From admission, onward)     Start     Ordered   06/18/24 0500  CBC with Differential/Platelet  Tomorrow morning,   R       Question:  Specimen collection method  Answer:  Lab=Lab collect   06/17/24 0952   06/18/24 0500  Lactic acid, plasma  (Lactic Acid)  Tomorrow morning,   R       Question:  Specimen collection method  Answer:  Lab=Lab collect   06/17/24 0952   06/18/24 0500  Comprehensive metabolic panel with GFR  Tomorrow morning,   R       Question:  Specimen collection method  Answer:  Lab=Lab collect   06/17/24 1001   06/16/24 0500  Protime-INR  Daily,   R     Question:  Specimen collection method  Answer:  Lab=Lab collect   06/15/24 0718   06/14/24 2251  Glucose, Body Fluid Other  Once,   R        06/14/24 2251   06/14/24 2251  Protein, body fluid (other)  Once,   R        06/14/24 2251            Signed, Chapman Rota, MD [facility] 06/17/2024  "

## 2024-06-17 NOTE — Plan of Care (Signed)

## 2024-06-17 NOTE — Plan of Care (Signed)
  Problem: Education: Goal: Knowledge of General Education information will improve Description: Including pain rating scale, medication(s)/side effects and non-pharmacologic comfort measures Outcome: Progressing   Problem: Clinical Measurements: Goal: Ability to maintain clinical measurements within normal limits will improve Outcome: Progressing Goal: Diagnostic test results will improve Outcome: Progressing   Problem: Elimination: Goal: Will not experience complications related to bowel motility Outcome: Progressing Goal: Will not experience complications related to urinary retention Outcome: Progressing

## 2024-06-17 NOTE — TOC Progression Note (Addendum)
 Transition of Care [facility]) - Progression Note    Patient Details  Name: Jeffrey Mathis MRN: [id number] Date of Birth: 07/08/1980  Transition of [facility] CM/SW Contact  Doneta Glenys DASEN, RN Phone Number: 06/17/2024, 8:24 AM  Clinical Narrative:    1:44 PM [facility] ([facility] SW will reach out patient to discuss POC upon discharge for substance abuse. 8:24 AM CM called [facility] [phone] ext. [phone] to inquire for inpatient rehab. Left message.   Expected Discharge Plan: Home w Home Health Services Barriers to Discharge: Continued Medical Work up               Expected Discharge Plan and Services In-house Referral: NA Discharge Planning Services: CM Consult Post Acute Care Choice: NA Living arrangements for the past 2 months: Single Family Home                 DME Arranged: N/A DME Agency: NA       HH Arranged: NA HH Agency: NA         Social Drivers of Health (SDOH) Interventions SDOH Screenings   Food Insecurity: No Food Insecurity (06/14/2024)  Housing: High Risk (06/14/2024)  Transportation Needs: No Transportation Needs (06/14/2024)  Utilities: Not At Risk (06/14/2024)  Tobacco Use: High Risk (06/14/2024)    Readmission Risk Interventions    05/02/2023    9:51 AM  Readmission Risk Prevention Plan  Post Dischage Appt Complete  Medication Screening Complete  Transportation Screening Complete

## 2024-06-17 NOTE — Progress Notes (Signed)
" ° °  NAME:  Jeffrey Mathis, MRN:  [id number], DOB:  1980/06/29, LOS: 3 ADMISSION DATE:  06/14/2024, CONSULTATION DATE:  06/14/24 REFERRING MD:  Fredirick, CHIEF COMPLAINT:  SOB   History of Present Illness:  44 year old man with what appears to be end stage alcoholic liver cirrhosis followed at [place] who presents with months of weight loss, weakness, and SOB.  Workup reveals AOC anemia, decompensated cirrhosis and large pleural effusion with tension features on imaging.  Pulmonary is consulted.  Patient is a poor historian so am relying on wife: she states breathing has been tough for him for very long time.  No recent trauma, no infectious symptoms.  No cough.  28 pack year smoker.  Pertinent  Medical History  ETOH cirrhosis  Significant Hospital Events: Including procedures, antibiotic start and stop dates in addition to other pertinent events   1/11 admit, left thoracentesis done with 2000 cc of bloody appearing fluid 1/12 E. coli bacteremia.  Started on antibiotics 1/13 getting daily blood unit transfusions.  GI is on board.  Increased diuretics  Interim History / Subjective:   Got 1 unit PRBC today Respiratory status stable on room air  Objective    Blood pressure (!) 100/48, pulse 99, temperature 98.1 F (36.7 C), temperature source Oral, resp. rate 18, height 6' 1 (1.854 m), weight 62.6 kg, SpO2 95%.        Intake/Output Summary (Last 24 hours) at 06/17/2024 0928 Last data filed at 06/17/2024 0800 Gross per 24 hour  Intake 1433 ml  Output 950 ml  Net 483 ml   Filed Weights   06/14/24 1813  Weight: 62.6 kg    Examination: Ill-appearing, malnourished, jaundice Lungs are clear to auscultation with diminished air entry on the left Heart rate regular rate and rhythm Abdomen soft, positive bowel sounds Extremities with no  Labs/Imaging reviewed Labs/imaging reviewed, hemoglobin 8.1, WBC 10.6, platelets 52 Sodium 132, potassium 3.0, BUN/creatinine 14/0.90.  Total bilirubin 28  point  Pleural studies-5756 nucleated cells, 74% neutrophils.  Cultures negative CT chest abdomen pelvis reviewed with large left effusion with near collapse of left lung, cirrhosis with portal hypertension, thickening of cecum and ascending colon  Resolved problem list   Assessment and Plan  Large left pleural effusion status postthoracentesis Appears hemorrhagic and may be old hemothorax from spontaneous bleeding.  Denies any trauma X-ray shows worsening effusion on the left Continue to increase diuretics as tolerated to mobilize fluid Will perform repeat thoracentesis if there is any respiratory distress. Follow final pleural studies  Sepsis, E. coli bacteremia Do not think that the source is from the pleural fluid Started on Zosyn .  Follow final sensitivities.  End-stage cirrhosis, portal hypertension Possible GI bleed GI is on board.  Continue PPI Transfuse as needed  Signature:   Eunie Lawn MD [facility] See Amion for pager  If no response to pager , please call [phone] until 7pm After 7:00 pm call Elink  [phone] 06/17/2024, 9:28 AM         "

## 2024-06-17 NOTE — Progress Notes (Signed)
 Subjective: Reports 2 bowel movements yesterday, neither of which have been bloody or black.  States he had multiple episodes of diuresis and states his breathing has improved.  Objective: Vital signs in last 24 hours: Temp:  [99 F (37.2 C)-101.8 F (38.8 C)] 101.4 F (38.6 C) (01/14 0515) Pulse Rate:  [99-130] 99 (01/14 0800) Resp:  [16-20] 18 (01/14 0515) BP: (100-142)/(45-83) 100/48 (01/14 0800) SpO2:  [93 %-99 %] 95 % (01/14 0800) Weight change:  Last BM Date : 06/16/24  PE: Deeply icteric GENERAL: Alert, awake, oriented x 3, mild asterixis ABDOMEN: Nondistended EXTREMITIES: No edema  Lab Results: Results for orders placed or performed during the hospital encounter of 06/14/24 (from the past 48 hours)  Occult blood card to lab, stool     Status: None   Collection Time: 06/15/24 10:40 AM  Result Value Ref Range   Fecal Occult Bld NEGATIVE NEGATIVE    Comment: Performed at [facility], [address], [place], [place] [address]  Urinalysis, Routine w reflex microscopic -Urine, Clean Catch     Status: Abnormal   Collection Time: 06/15/24 12:22 PM  Result Value Ref Range   Color, Urine AMBER (A) YELLOW    Comment: BIOCHEMICALS MAY BE AFFECTED BY COLOR   APPearance CLEAR CLEAR   Specific Gravity, Urine 1.008 1.005 - 1.030   pH 6.0 5.0 - 8.0   Glucose, UA NEGATIVE NEGATIVE mg/dL   Hgb urine dipstick SMALL (A) NEGATIVE   Bilirubin Urine MODERATE (A) NEGATIVE   Ketones, ur NEGATIVE NEGATIVE mg/dL   Protein, ur NEGATIVE NEGATIVE mg/dL   Nitrite NEGATIVE NEGATIVE   Leukocytes,Ua NEGATIVE NEGATIVE   RBC / HPF 0-5 0 - 5 RBC/hpf   WBC, UA 0-5 0 - 5 WBC/hpf   Bacteria, UA NONE SEEN NONE SEEN   Squamous Epithelial / HPF 0-5 0 - 5 /HPF   Mucus PRESENT    Hyaline Casts, UA PRESENT     Comment: Performed at [facility], [address], [place], [place] [address]  Hemoglobin and hematocrit, blood     Status: Abnormal   Collection Time:  06/15/24 12:40 PM  Result Value Ref Range   Hemoglobin 7.4 (L) 13.0 - 17.0 g/dL   HCT 79.3 (L) 60.9 - 47.9 %    Comment: Performed at [facility], [address], [place], [place] [address]  Comprehensive metabolic panel with GFR     Status: Abnormal   Collection Time: 06/16/24  4:31 AM  Result Value Ref Range   Sodium 129 (L) 135 - 145 mmol/L   Potassium 3.8 3.5 - 5.1 mmol/L   Chloride 97 (L) 98 - 111 mmol/L   CO2 23 22 - 32 mmol/L   Glucose, Bld 129 (H) 70 - 99 mg/dL    Comment: Glucose reference range applies only to samples taken after fasting for at least 8 hours.   BUN 25 (H) 6 - 20 mg/dL   Creatinine, Ser 8.99 0.61 - 1.24 mg/dL    Comment: ICTERUS AT THIS LEVEL MAY AFFECT RESULT   Calcium 8.4 (L) 8.9 - 10.3 mg/dL   Total Protein 5.1 (L) 6.5 - 8.1 g/dL    Comment: ICTERUS AT THIS LEVEL MAY AFFECT RESULT   Albumin  2.9 (L) 3.5 - 5.0 g/dL   AST 71 (H) 15 - 41 U/L   ALT 20 0 - 44 U/L   Alkaline Phosphatase 100 38 - 126 U/L   Total Bilirubin 24.8 (HH) 0.0 - 1.2  mg/dL    Comment: Critical value noted. Value is consistent with previously reported and called value    GFR, Estimated >60 >60 mL/min    Comment: (NOTE) Calculated using the CKD-EPI Creatinine Equation (2021)    Anion gap 10 5 - 15    Comment: Performed at [facility], [address], [place], [place] [address]  CBC     Status: Abnormal   Collection Time: 06/16/24  4:31 AM  Result Value Ref Range   WBC 11.7 (H) 4.0 - 10.5 K/uL   RBC 1.68 (L) 4.22 - 5.81 MIL/uL   Hemoglobin 6.0 (LL) 13.0 - 17.0 g/dL    Comment: This critical result has been called to DONER C. RN by Jolande Mecial on 06/16/2024 05:12:03, and has been read back.   HCT 17.0 (L) 39.0 - 52.0 %   MCV 101.2 (H) 80.0 - 100.0 fL   MCH 35.7 (H) 26.0 - 34.0 pg   MCHC 35.3 30.0 - 36.0 g/dL   RDW 77.4 (H) 88.4 - 84.4 %   Platelets 49 (L) 150 - 400 K/uL    Comment: SPECIMEN CHECKED FOR CLOTS Immature Platelet Fraction  may be clinically indicated, consider ordering this additional test [id number]    nRBC 0.3 (H) 0.0 - 0.2 %    Comment: Performed at [facility], [address], [place], [place] [address]  Protime-INR     Status: Abnormal   Collection Time: 06/16/24  4:31 AM  Result Value Ref Range   Prothrombin Time 31.3 (H) 11.4 - 15.2 seconds   INR 2.9 (H) 0.8 - 1.2    Comment: (NOTE) INR goal varies based on device and disease states. Performed at [facility], [address] [place], [place] [address]   Prepare RBC (crossmatch)     Status: None   Collection Time: 06/16/24  5:50 AM  Result Value Ref Range   Order Confirmation      ORDER PROCESSED BY BLOOD BANK Performed at [facility], [address] [place], [place] [address]   Hemoglobin and hematocrit, blood     Status: Abnormal   Collection Time: 06/16/24  7:02 PM  Result Value Ref Range   Hemoglobin 6.6 (LL) 13.0 - 17.0 g/dL    Comment: This critical result has been called to colbert, J Rn by Darice Freddi Hong on 06/16/2024 20:01:52, and has been read back.   HCT 18.5 (L) 39.0 - 52.0 %    Comment: Performed at [facility], [address] [place], [place] [address]  Prepare RBC (crossmatch)     Status: None   Collection Time: 06/16/24  8:22 PM  Result Value Ref Range   Order Confirmation      ORDER PROCESSED BY BLOOD BANK Performed at [facility], [address], [place], [place] [address]   Comprehensive metabolic panel with GFR     Status: Abnormal   Collection Time: 06/17/24  4:41 AM  Result Value Ref Range   Sodium 132 (L) 135 - 145 mmol/L   Potassium 3.0 (L) 3.5 - 5.1 mmol/L    Comment: Delta check noted  Repeated to verify     Chloride 94 (L) 98 - 111 mmol/L   CO2 23 22 - 32 mmol/L   Glucose, Bld 141 (H) 70 - 99 mg/dL    Comment: Glucose reference range applies only to samples taken after fasting for at least 8 hours.    BUN 14 6 -  20 mg/dL   Creatinine, Ser 9.09 0.61 - 1.24 mg/dL    Comment: ICTERUS AT THIS LEVEL MAY AFFECT RESULT   Calcium 8.8 (L) 8.9 - 10.3 mg/dL   Total Protein 5.7 (L) 6.5 - 8.1 g/dL    Comment: ICTERUS AT THIS LEVEL MAY AFFECT RESULT   Albumin  3.6 3.5 - 5.0 g/dL   AST 72 (H) 15 - 41 U/L   ALT 23 0 - 44 U/L   Alkaline Phosphatase 88 38 - 126 U/L   Total Bilirubin 28.3 (HH) 0.0 - 1.2 mg/dL    Comment: Critical value noted. Value is consistent with previously reported and called value    GFR, Estimated >60 >60 mL/min    Comment: (NOTE) Calculated using the CKD-EPI Creatinine Equation (2021)    Anion gap 15 5 - 15    Comment: Performed at [facility], [address], [place], [place] [address]  CBC     Status: Abnormal   Collection Time: 06/17/24  4:41 AM  Result Value Ref Range   WBC 10.6 (H) 4.0 - 10.5 K/uL   RBC 2.32 (L) 4.22 - 5.81 MIL/uL   Hemoglobin 8.1 (L) 13.0 - 17.0 g/dL    Comment: REPEATED TO VERIFY POST TRANSFUSION SPECIMEN    HCT 22.4 (L) 39.0 - 52.0 %   MCV 96.6 80.0 - 100.0 fL   MCH 34.9 (H) 26.0 - 34.0 pg   MCHC 36.2 (H) 30.0 - 36.0 g/dL   RDW 79.5 (H) 88.4 - 84.4 %   Platelets 52 (L) 150 - 400 K/uL    Comment: REPEATED TO VERIFY CONSISTENT WITH PREVIOUS RESULT Immature Platelet Fraction may be clinically indicated, consider ordering this additional test [id number]    nRBC 0.3 (H) 0.0 - 0.2 %    Comment: Performed at [facility], [address] [place], [place] [address]  Protime-INR     Status: Abnormal   Collection Time: 06/17/24  4:41 AM  Result Value Ref Range   Prothrombin Time 27.2 (H) 11.4 - 15.2 seconds   INR 2.4 (H) 0.8 - 1.2    Comment: (NOTE) INR goal varies based on device and disease states. Performed at [facility], [address], [place], [place] [address]     Studies/Results: DG CHEST PORT 1 VIEW Result Date: 06/16/2024 EXAM: 1 VIEW(S) XRAY OF THE CHEST 06/16/2024  06:29:00 AM COMPARISON: 06/15/2024 CLINICAL HISTORY: Hemothorax FINDINGS: LUNGS AND PLEURA: Moderate left pleural effusion, increased from prior. Hazy opacity throughout left lung, increased from prior, which likely represents a combination of posteriorly layering pleural fluid and atelectasis. No pneumothorax. HEART AND MEDIASTINUM: No acute abnormality of the cardiac and mediastinal silhouettes. BONES AND SOFT TISSUES: No acute osseous abnormality. IMPRESSION: 1. Moderate left pleural effusion, increased from prior. 2. Hazy opacity throughout the left lung, increased from prior, likely representing a combination of posteriorly layering pleural fluid and atelectasis. Electronically signed by: Waddell Calk MD MD 06/16/2024 08:35 AM EST RP Workstation: [id number]    Medications: I have reviewed the patient's current medications.  Assessment: Decompensated liver cirrhosis, likely related to significant alcohol use, blood alcohol level 71 mg/dL on 8/88/7973 On folic acid , multivitamin and thiamine  and IV Ativan  as needed On lactulose  20 g twice a day   MELD sodium score 31(sodium 132, creatinine 0.9, T. bili 28.3, INR 2.4)   Stable coagulopathy, PT 27.2/INR 2.4   Renal function stable, creatinine 1 yesterday and 0.9 today, GFR remains above 60, BUN improved from 25  to 14   Hemoglobin dropped from 6.4-6 without melena or hematochezia Has improved to 8.1 post transfusion  This could be hemodilutional, possibly related IV albumin  use FOBT  negative on 06/15/2024 On pantoprazole  40 mg once a day S/p 1 unit PRBC transfusion on 06/14/2024, 1 unit PRBC transfusion on 06/15/2024 1 unit transfusion  on 06/16/2024   Thrombocytopenia, platelet 52   Status post thoracocentesis, WBC 5756 with 74% neutrophils Chest x-ray today showed moderate left pleural effusion On spironolactone  25 mg once a day and furosemide  40 mg IV daily   E. coli bacteremia, Enterobacterales/E. coli on blood culture from  06/14/2024: On IV Zosyn   Plan: Since he is tolerating diuretics without impairment of renal function, will increase spironolactone  to 100 mg a day and continue furosemide  40 mg IV twice a day.  Continue Xifaxan  550 mg twice a day and lactulose  20 g twice a day.  Folic acid , multivitamin and thiamine .  Patient has been changed from IV Zosyn  to IV Flagyl  and IV ceftriaxone .  I will discontinue vitamin K as this has limited utility in coagulopathy associated with cirrhosis.  He is being given potassium 20 mEq daily for hypokalemia, this needs to be monitored as I am increasing his dose of spironolactone .  Guarded prognosis.  Estelita Manas, MD 06/17/2024, 9:10 AM

## 2024-06-18 LAB — COMPREHENSIVE METABOLIC PANEL WITH GFR
ALT: 22 U/L (ref 0–44)
AST: 67 U/L — ABNORMAL HIGH (ref 15–41)
Albumin: 3.4 g/dL — ABNORMAL LOW (ref 3.5–5.0)
Alkaline Phosphatase: 91 U/L (ref 38–126)
Anion gap: 11 (ref 5–15)
BUN: 14 mg/dL (ref 6–20)
CO2: 24 mmol/L (ref 22–32)
Calcium: 8.6 mg/dL — ABNORMAL LOW (ref 8.9–10.3)
Chloride: 96 mmol/L — ABNORMAL LOW (ref 98–111)
Creatinine, Ser: 0.95 mg/dL (ref 0.61–1.24)
GFR, Estimated: >60 mL/min
Glucose, Bld: 116 mg/dL — ABNORMAL HIGH (ref 70–99)
Potassium: 3.3 mmol/L — ABNORMAL LOW (ref 3.5–5.1)
Sodium: 131 mmol/L — ABNORMAL LOW (ref 135–145)
Total Bilirubin: 24.6 mg/dL (ref 0.0–1.2)
Total Protein: 5.6 g/dL — ABNORMAL LOW (ref 6.5–8.1)

## 2024-06-18 LAB — TYPE AND SCREEN
ABO/RH(D): A POS
Antibody Screen: NEGATIVE
Unit division: 0
Unit division: 0
Unit division: 0
Unit division: 0
Unit division: 0
Unit division: 0

## 2024-06-18 LAB — CBC WITH DIFFERENTIAL/PLATELET
Abs Immature Granulocytes: 0.1 K/uL — ABNORMAL HIGH (ref 0.00–0.07)
Basophils Absolute: 0.1 K/uL (ref 0.0–0.1)
Basophils Relative: 1 %
Eosinophils Absolute: 0.2 K/uL (ref 0.0–0.5)
Eosinophils Relative: 2 %
HCT: 23.2 % — ABNORMAL LOW (ref 39.0–52.0)
Hemoglobin: 8.3 g/dL — ABNORMAL LOW (ref 13.0–17.0)
Immature Granulocytes: 1 %
Lymphocytes Relative: 17 %
Lymphs Abs: 1.6 K/uL (ref 0.7–4.0)
MCH: 33.7 pg (ref 26.0–34.0)
MCHC: 35.8 g/dL (ref 30.0–36.0)
MCV: 94.3 fL (ref 80.0–100.0)
Monocytes Absolute: 2.2 K/uL — ABNORMAL HIGH (ref 0.1–1.0)
Monocytes Relative: 23 %
Neutro Abs: 5.6 K/uL (ref 1.7–7.7)
Neutrophils Relative %: 56 %
Platelets: 52 K/uL — ABNORMAL LOW (ref 150–400)
RBC: 2.46 MIL/uL — ABNORMAL LOW (ref 4.22–5.81)
RDW: 20.4 % — ABNORMAL HIGH (ref 11.5–15.5)
Smear Review: NORMAL
WBC: 9.8 K/uL (ref 4.0–10.5)
nRBC: 0.3 % — ABNORMAL HIGH (ref 0.0–0.2)

## 2024-06-18 LAB — PROTIME-INR
INR: 2.3 — ABNORMAL HIGH (ref 0.8–1.2)
Prothrombin Time: 26.5 s — ABNORMAL HIGH (ref 11.4–15.2)

## 2024-06-18 LAB — BPAM RBC
Blood Product Expiration Date: 202602132359
Blood Product Expiration Date: 202602082359
Blood Product Expiration Date: 202602082359
Blood Product Expiration Date: 202602112359
Blood Product Expiration Date: 202602112359
Blood Product Unit Number: 202602132359
ISSUE DATE / TIME: 202601112057
ISSUE DATE / TIME: 202601120003
ISSUE DATE / TIME: 202601131157
ISSUE DATE / TIME: 202601132237
ISSUE DATE / TIME: 202601150944
PRODUCT CODE: 202601140442
PRODUCT CODE: 202602132359
Unit Type and Rh: 6200
Unit Type and Rh: 202602132359
Unit Type and Rh: 6200
Unit Type and Rh: 6200
Unit Type and Rh: 6200
Unit Type and Rh: 6200
Unit Type and Rh: 6200
Unit Type and Rh: 6200

## 2024-06-18 LAB — BODY FLUID CULTURE W GRAM STAIN: Culture: NO GROWTH

## 2024-06-18 LAB — LACTIC ACID, PLASMA: Lactic Acid, Venous: 1.7 mmol/L (ref 0.5–1.9)

## 2024-06-18 MED ORDER — FUROSEMIDE 10 MG/ML IJ SOLN
40.0000 mg | Freq: Two times a day (BID) | INTRAMUSCULAR | Status: DC
  Administered 2024-06-18 – 2024-06-19 (×2): 40 mg via INTRAVENOUS
  Filled 2024-06-18 (×2): qty 4

## 2024-06-18 MED ORDER — POTASSIUM CHLORIDE CRYS ER 20 MEQ PO TBCR
40.0000 meq | EXTENDED_RELEASE_TABLET | Freq: Once | ORAL | Status: AC
  Administered 2024-06-18: 40 meq via ORAL
  Filled 2024-06-18: qty 2

## 2024-06-18 NOTE — Plan of Care (Signed)

## 2024-06-18 NOTE — Progress Notes (Signed)
 Subjective: Patient is requesting that diuretics not be given later in the day as it keeps him up all night.  He reports not being able to sleep last night and feels tired.  Objective: Vital signs in last 24 hours: Temp:  [98.4 F (36.9 C)-100 F (37.8 C)] 99.6 F (37.6 C) (01/15 0450) Pulse Rate:  [94-107] 107 (01/15 0450) Resp:  [17-18] 18 (01/14 1933) BP: (102-114)/(46-69) 102/46 (01/15 0450) SpO2:  [93 %-98 %] 95 % (01/15 0450) Weight change:  Last BM Date : 06/16/24  PE: Deeply icteric GENERAL: Prominent pallor  ABDOMEN: Mildly distended abdomen but not tense EXTREMITIES: No deformity  Lab Results: Results for orders placed or performed during the hospital encounter of 06/14/24 (from the past 48 hours)  Hemoglobin and hematocrit, blood     Status: Abnormal   Collection Time: 06/16/24  7:02 PM  Result Value Ref Range   Hemoglobin 6.6 (LL) 13.0 - 17.0 g/dL    Comment: This critical result has been called to colbert, J Rn by Darice Freddi Hong on 06/16/2024 20:01:52, and has been read back.   HCT 18.5 (L) 39.0 - 52.0 %    Comment: Performed at [facility], [address], [place], [place] [address]  Prepare RBC (crossmatch)     Status: None   Collection Time: 06/16/24  8:22 PM  Result Value Ref Range   Order Confirmation      ORDER PROCESSED BY BLOOD BANK Performed at [facility], [address], [place], [place] [address]   Comprehensive metabolic panel with GFR     Status: Abnormal   Collection Time: 06/17/24  4:41 AM  Result Value Ref Range   Sodium 132 (L) 135 - 145 mmol/L   Potassium 3.0 (L) 3.5 - 5.1 mmol/L    Comment: Delta check noted  Repeated to verify     Chloride 94 (L) 98 - 111 mmol/L   CO2 23 22 - 32 mmol/L   Glucose, Bld 141 (H) 70 - 99 mg/dL    Comment: Glucose reference range applies only to samples taken after fasting for at least 8 hours.   BUN 14 6 - 20 mg/dL   Creatinine, Ser 9.09 0.61 - 1.24 mg/dL     Comment: ICTERUS AT THIS LEVEL MAY AFFECT RESULT   Calcium 8.8 (L) 8.9 - 10.3 mg/dL   Total Protein 5.7 (L) 6.5 - 8.1 g/dL    Comment: ICTERUS AT THIS LEVEL MAY AFFECT RESULT   Albumin  3.6 3.5 - 5.0 g/dL   AST 72 (H) 15 - 41 U/L   ALT 23 0 - 44 U/L   Alkaline Phosphatase 88 38 - 126 U/L   Total Bilirubin 28.3 (HH) 0.0 - 1.2 mg/dL    Comment: Critical value noted. Value is consistent with previously reported and called value    GFR, Estimated >60 >60 mL/min    Comment: (NOTE) Calculated using the CKD-EPI Creatinine Equation (2021)    Anion gap 15 5 - 15    Comment: Performed at [facility], [address] [place], [place] [address]  CBC     Status: Abnormal   Collection Time: 06/17/24  4:41 AM  Result Value Ref Range   WBC 10.6 (H) 4.0 - 10.5 K/uL   RBC 2.32 (L) 4.22 - 5.81 MIL/uL   Hemoglobin 8.1 (L) 13.0 - 17.0 g/dL    Comment: REPEATED TO VERIFY POST TRANSFUSION SPECIMEN    HCT 22.4 (L) 39.0 - 52.0 %  MCV 96.6 80.0 - 100.0 fL   MCH 34.9 (H) 26.0 - 34.0 pg   MCHC 36.2 (H) 30.0 - 36.0 g/dL   RDW 79.5 (H) 88.4 - 84.4 %   Platelets 52 (L) 150 - 400 K/uL    Comment: REPEATED TO VERIFY CONSISTENT WITH PREVIOUS RESULT Immature Platelet Fraction may be clinically indicated, consider ordering this additional test [id number]    nRBC 0.3 (H) 0.0 - 0.2 %    Comment: Performed at [facility], [address], [place], [place] [address]  Protime-INR     Status: Abnormal   Collection Time: 06/17/24  4:41 AM  Result Value Ref Range   Prothrombin Time 27.2 (H) 11.4 - 15.2 seconds   INR 2.4 (H) 0.8 - 1.2    Comment: (NOTE) INR goal varies based on device and disease states. Performed at [facility], [address] [place], [place] [address]   Hemoglobin and hematocrit, blood     Status: Abnormal   Collection Time: 06/17/24 11:02 AM  Result Value Ref Range   Hemoglobin 8.1 (L) 13.0 - 17.0 g/dL   HCT 77.5 (L) 60.9 - 47.9  %    Comment: Performed at [facility] [address] [place], [place] [address]  Protime-INR     Status: Abnormal   Collection Time: 06/18/24  4:39 AM  Result Value Ref Range   Prothrombin Time 26.5 (H) 11.4 - 15.2 seconds   INR 2.3 (H) 0.8 - 1.2    Comment: (NOTE) INR goal varies based on device and disease states. Performed at [facility], [address], [place], [place] [address]   CBC with Differential/Platelet     Status: Abnormal   Collection Time: 06/18/24  4:39 AM  Result Value Ref Range   WBC 9.8 4.0 - 10.5 K/uL   RBC 2.46 (L) 4.22 - 5.81 MIL/uL   Hemoglobin 8.3 (L) 13.0 - 17.0 g/dL   HCT 76.7 (L) 60.9 - 47.9 %   MCV 94.3 80.0 - 100.0 fL   MCH 33.7 26.0 - 34.0 pg   MCHC 35.8 30.0 - 36.0 g/dL   RDW 79.5 (H) 88.4 - 84.4 %   Platelets 52 (L) 150 - 400 K/uL    Comment: SPECIMEN CHECKED FOR CLOTS CONSISTENT WITH PREVIOUS RESULT Immature Platelet Fraction may be clinically indicated, consider ordering this additional test [id number]    nRBC 0.3 (H) 0.0 - 0.2 %   Neutrophils Relative % 56 %   Neutro Abs 5.6 1.7 - 7.7 K/uL   Lymphocytes Relative 17 %   Lymphs Abs 1.6 0.7 - 4.0 K/uL   Monocytes Relative 23 %   Monocytes Absolute 2.2 (H) 0.1 - 1.0 K/uL   Eosinophils Relative 2 %   Eosinophils Absolute 0.2 0.0 - 0.5 K/uL   Basophils Relative 1 %   Basophils Absolute 0.1 0.0 - 0.1 K/uL   WBC Morphology MORPHOLOGY UNREMARKABLE    Smear Review Normal platelet morphology    Immature Granulocytes 1 %   Abs Immature Granulocytes 0.10 (H) 0.00 - 0.07 K/uL   Acanthocytes PRESENT     Comment: Performed at [facility], [address] [place], [place] [address]  Lactic acid, plasma     Status: None   Collection Time: 06/18/24  4:39 AM  Result Value Ref Range   Lactic Acid, Venous 1.7 0.5 - 1.9 mmol/L    Comment: Performed at [facility], [address] [place],  [place] [address]  Comprehensive  metabolic panel with GFR     Status: Abnormal   Collection Time: 06/18/24  4:39 AM  Result Value Ref Range   Sodium 131 (L) 135 - 145 mmol/L   Potassium 3.3 (L) 3.5 - 5.1 mmol/L   Chloride 96 (L) 98 - 111 mmol/L   CO2 24 22 - 32 mmol/L   Glucose, Bld 116 (H) 70 - 99 mg/dL    Comment: Glucose reference range applies only to samples taken after fasting for at least 8 hours.   BUN 14 6 - 20 mg/dL   Creatinine, Ser 9.04 0.61 - 1.24 mg/dL    Comment: ICTERUS AT THIS LEVEL MAY AFFECT RESULT   Calcium 8.6 (L) 8.9 - 10.3 mg/dL   Total Protein 5.6 (L) 6.5 - 8.1 g/dL    Comment: ICTERUS AT THIS LEVEL MAY AFFECT RESULT   Albumin  3.4 (L) 3.5 - 5.0 g/dL   AST 67 (H) 15 - 41 U/L   ALT 22 0 - 44 U/L   Alkaline Phosphatase 91 38 - 126 U/L   Total Bilirubin 24.6 (HH) 0.0 - 1.2 mg/dL    Comment: Critical Value, Read Back and verified with MELVYN BERRY RN AT [phone] ON 06/18/2024 BY JM    GFR, Estimated >60 >60 mL/min    Comment: (NOTE) Calculated using the CKD-EPI Creatinine Equation (2021)    Anion gap 11 5 - 15    Comment: Performed at [facility], [address], [place], [place] [address]    Studies/Results: No results found.  Medications: I have reviewed the patient's current medications.  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 bili/AST/ALT/ALP 24.6/67/22/91, possible acute alcoholic hepatitis with cirrhosis Mild hyponatremia, 131 Mild hypokalemia 3.3 Mild malnutrition, total protein 5.6, albumin  3.4 Coagulopathy, PT 27.2, INR 2.4  Mild hepatic encephalopathy, on lactulose  and Xifaxan   Plan: Continue folic acid , multivitamin, thiamine . Continue current dose of spironolactone  100 mg once a day and furosemide  40 mg twice a day. Continue lactulose  20 g  twice a day and Xifaxan  550 mg twice a day. Continue pantoprazole  40 mg at bedtime. 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 At 1 point patient will benefit from EGD, however we will avoid this with ongoing coagulopathy as there is increased risk of bleeding, patient currently does not have evidence of melena or hematochezia.  If patient stops drinking alcohol, remains compliant with follow-up, he may be a candidate for liver transplant evaluation in the future.  If labs are stable, okay to DC home tomorrow on  furosemide  40 mg once a day and spironolactone  100 mg once a day Lactulose  20 g twice a day and Xifaxan  550 mg twice a day Folic acid  1 mg daily, multivitamin 1 tablet daily, thiamine  100 mg daily.  I will sign off, please recall if needed.  Estelita Manas, MD 06/18/2024, 11:33 AM

## 2024-06-18 NOTE — Progress Notes (Signed)
 " PROGRESS NOTE  Jeffrey Mathis  DOB: July 23, 1980  PCP: [facility] FMW:[id number]  DOA: 06/14/2024  LOS: 4 days  Hospital Day: 5  Subjective: Patient was seen and examined this morning. Lying on bed.  Not in distress.  Alert, awake, slow to respond but oriented x 3.  Family not at bedside today. No recurrence of fever since yesterday morning, blood pressure in low 100s, breathing on room air Labs from this morning with WBC count 9.8, hemoglobin 8.3, sodium 131, potassium 3.3, lactic acid 1.7  Brief narrative: Jeffrey Mathis is a 44 y.o. male with PMH significant for alcoholic liver cirrhosis, esophageal varices ongoing alcohol use, pancreatitis, PTSD He gets his usual care at [facility] and was recently referred to [facility] for possible need of liver transplant.  1/11, patient presented to ED with shortness of breath, worsening jaundice, intermittent melena and 30 pounds weight loss in 7 to 8 months.  In the ED, his oxygen saturation was low in 80s. Labs showed elevated alcohol level, lipase, INR, lactic acid, ammonia, LFTs with markedly elevated T. bili, low hemoglobin and low platelets. Chest x-ray showed large left pleural effusion Chest abdomen pelvis showed large left pleural effusion with near complete collapse of the left lung and mild mediastinal shift, mild ascites. PCCM did a bedside thoracentesis and aspirated 2 L of blood tinged fluid Was given blood transfusion. Admitted to [facility] PCCM, GI were consulted  In the last 24 hours, he has been spiking persistent fever as high as 101.6, tachycardic to 110s,  Assessment and plan: Large left pleural effusion hepatic hydrothorax/hemothorax Acute hypoxic respiratory failure  Presented with shortness of breath  Initial imagings as above showed large pleural effusion  Underwent bedside thoracentesis by PCCM and got 2 L blood-tinged fluid drained Fluid analysis showed 72% neutrophils. PCCM following Continue  antibiotics Continue supplemental oxygen Recent Labs  Lab 06/14/24 1335 06/14/24 1348 06/15/24 0400 06/16/24 0431 06/17/24 0441 06/18/24 0439  WBC 12.6*  --  8.7 11.7* 10.6* 9.8  LATICACIDVEN  --  2.6*  --   --   --  1.7    Sepsis POA E. coli bacteremia Fever spikes Blood culture sent on admission grew E. coli, pansensitive Likely intra-abdominal source. On empiric antibiotics with IV Rocephin  and Flagyl  In the last 24 hours, fever spikes have subsided.  Continue to monitor  Decompensated alcoholic liver cirrhosis Portal hypertension/ascites/Esophageal varices: Coagulopathy-thrombocytopenia, elevated INR Hyperammonemia GI following MELD sodium score 31, INR elevated over 2, platelet level running low Renal function stable Currently tolerating diuresis with IV Lasix  40 mg twice daily, Aldactone  100 mg daily. Also continued on Protonix  40 mg nightly, lactulose  20 mg twice daily, Xifaxan  550 mg twice daily, folic acid , multivitamin, thiamine  Total bili at 5.6 today Due to ongoing alcohol use will not be a candidate for liver transplant evaluation.  Overall his prognosis is poor, palliative care has been consulted Recent Labs  Lab 06/14/24 1335 06/14/24 1426 06/15/24 0400 06/16/24 0431 06/17/24 0441 06/18/24 0439  AST 125*  --  97* 71* 72* 67*  ALT 28  --  23 20 23 22   ALKPHOS 140*  --  111 100 88 91  BILITOT 18.1*  --  20.7* 24.8* 28.3* 24.6*  PROT 5.9*  --  5.1* 5.1* 5.7* 5.6*  ALBUMIN  2.9*  --  2.4* 2.9* 3.6 3.4*  AMMONIA 38*  --   --   --   --   --   INR  --  2.0* 2.1* 2.9* 2.4*  2.3*  LIPASE 92*  --   --   --   --   --   PLT 54*  --  47* 49* 52* 52*   Acute on chronic blood loss anemia H/o esophageal varices Hemoglobin dropped to the lowest of 6 on 1/13, improved after blood transfusion.  8.1 this morning. Acute blood loss likely secondary to hemothorax.  FOBT negative Continue Protonix  Received total of 4 units PRBC transfusion so far Recent Labs     06/16/24 0431 06/16/24 1902 06/17/24 0441 06/17/24 1102 06/18/24 0439  HGB 6.0* 6.6* 8.1* 8.1* 8.3*  MCV 101.2*  --  96.6  --  94.3   Ongoing chronic alcohol use  Had elevated blood alcohol level at presentation continue CIWA scale, Ativan  thiamine  folate Counseled to quit  Chronic alcoholic pancreatitis Lipase is mildly elevated at 92. Cont diet as tolerated.   Hyponatremia Hypokalemia Mild.  Likely from cirrhosis, diuresis. On KCl 20 mEq daily.  Give additional 40 mEq today.  Continue to monitor. Recent Labs  Lab 06/14/24 1335 06/15/24 0400 06/16/24 0431 06/17/24 0441 06/18/24 0439  NA 134* 131* 129* 132* 131*  K 4.3 4.4 3.8 3.0* 3.3*  CL 98 97* 97* 94* 96*  CO2 23 23 23 23 24   GLUCOSE 121* 129* 129* 141* 116*  BUN 8 19 25* 14 14  CREATININE 0.61 1.22 1.00 0.90 0.95  CALCIUM 8.4* 8.2* 8.4* 8.8* 8.6*  MG 1.6*  --   --   --   --   PHOS 3.7  --   --   --   --    Tobacco use: Patient offered and declined nicotine patch   Severe malnutrition with  Body mass index is 18.21 kg/m. Augment diet as able.   Mobility: encourage ambulation  PT Orders:   PT Follow up Rec:     Goals of care   Code Status: Full Code     DVT prophylaxis:  SCDs Start: 06/14/24 1804   Antimicrobials: IV Rocephin , Flagyl  Fluid: None Consultants: GI, pulmonary, palliative care Family Communication: Wife at bedside  Status: Inpatient Level of care:  Med-Surg   Patient is from: Home Needs to continue in-hospital care: Poor prognosis. Anticipated d/c to: Pending clinical course.  If remains clinically stable, hopefully home in 1 to 2 days      Diet:  Diet Order             Diet Heart Room service appropriate? Yes; Fluid consistency: Thin  Diet effective now                   Scheduled Meds:  sodium chloride    Intravenous Once   feeding supplement  237 mL Oral BID BM   folic acid   1 mg Oral Daily   furosemide   40 mg Intravenous BID   lactulose   20 g Oral BID    metroNIDAZOLE   500 mg Oral Q12H   multivitamin with minerals  1 tablet Oral Daily   pantoprazole   40 mg Oral QHS   potassium chloride   20 mEq Oral Daily   rifaximin   550 mg Oral BID   sodium chloride  flush  3 mL Intravenous Q12H   spironolactone   100 mg Oral Daily   thiamine   100 mg Oral Daily   Or   thiamine   100 mg Intravenous Daily    PRN meds: acetaminophen , oxyCODONE , polyethylene glycol, promethazine , sodium chloride  flush   Infusions:   cefTRIAXone  (ROCEPHIN )  IV 2 g (06/18/24 1034)  Antimicrobials: Anti-infectives (From admission, onward)    Start     Dose/Rate Route Frequency Ordered Stop   06/17/24 1000  metroNIDAZOLE  (FLAGYL ) tablet 500 mg        500 mg Oral Every 12 hours 06/17/24 0859     06/17/24 0945  cefTRIAXone  (ROCEPHIN ) 2 g in sodium chloride  0.9 % 100 mL IVPB        2 g 200 mL/hr over 30 Minutes Intravenous Daily 06/17/24 0859     06/16/24 1800  rifaximin  (XIFAXAN ) tablet 550 mg        550 mg Oral 2 times daily 06/16/24 1329     06/15/24 1230  piperacillin -tazobactam (ZOSYN ) IVPB 3.375 g  Status:  Discontinued        3.375 g 12.5 mL/hr over 240 Minutes Intravenous Every 8 hours 06/15/24 1132 06/17/24 0859       Objective: Vitals:   06/17/24 1933 06/18/24 0450  BP: (!) 114/55 (!) 102/46  Pulse:  (!) 107  Resp: 18   Temp: 100 F (37.8 C) 99.6 F (37.6 C)  SpO2: 93% 95%    Intake/Output Summary (Last 24 hours) at 06/18/2024 1415 Last data filed at 06/18/2024 0745 Gross per 24 hour  Intake 120.98 ml  Output 500 ml  Net -379.02 ml   Filed Weights   06/14/24 1813  Weight: 62.6 kg   Weight change:  Body mass index is 18.21 kg/m.   Physical Exam: General exam: Pleasant, young Caucasian male.  Looks older than his stated age Skin: No rashes, lesions or ulcers. jaundiced HEENT: Atraumatic, normocephalic, no obvious bleeding Lungs: Clear to auscultation bilaterally,  CVS: S1, S2, no murmur,   GI/Abd: Soft, nontender, nondistended,  bowel sound present,   CNS: Alert, awake, oriented x 3 Psychiatry: Sad affect Extremities: No pedal edema, no calf tenderness,   Data Review: I have personally reviewed the laboratory data and studies available.  F/u labs ordered Unresulted Labs (From admission, onward)     Start     Ordered   06/16/24 0500  Protime-INR  Daily,   R     Question:  Specimen collection method  Answer:  Lab=Lab collect   06/15/24 0718   06/14/24 2251  Glucose, Body Fluid Other  Once,   R        06/14/24 2251   06/14/24 2251  Protein, body fluid (other)  Once,   R        06/14/24 2251            Signed, Chapman Rota, MD [facility] 06/18/2024  "

## 2024-06-18 NOTE — Progress Notes (Signed)
 " Daily Progress Note   Patient Name: Jeffrey Mathis       Date: 06/18/2024 DOB: August 07, 1980  Age: 44 y.o. MRN#: [id number] Attending Physician: Arlice Reichert, MD Primary Care Physician: [facility] Admit Date: 06/14/2024 Length of Stay: 4 days  Reason for Follow-up: Establishing goals of care  Subjective:   CC: Patient notes he did not sleep well due to frequent urination.  Following up regarding goals of care.  Reviewed EMR including documentation from GI, pulmonologist, and hospitalist today.  Hospitalist noted temperature has trended down without reoccurrence since yesterday morning.  Pulmonologist continuing to recommend diuretics to mobilize fluid and again no need for repeat thoracentesis at this time without signs of respiratory distress.  GI provided recommendations for medication adjustments noting that if patient's labs remained stable, could potentially discharge tomorrow. Reviewed CMP from today noting patient's total bilirubin has trended down slightly to 24.6.  Patient's hemoglobin remained stable at 8.3 and platelets remain stable at 52.  INR noted to be 2.3 today.  Presented to bedside to see patient.  Patient's wife present at bedside.  Reintroduced myself as a member of the palliative medicine team.  Patient noted that he was feeling very tired today because he did not sleep well from needing to urinate every 15 minutes overnight from the diuretics.  They have already discussed this with the provider to adjust medication to be given earlier in the day so patient is not up all night.  Discussed other medical updates patient had heard from providers.  Inquired if patient and wife have further discussed our conversation from yesterday related to goals for medical care and CODE STATUS.  They noted they had and patient is still considering aspects about CODE STATUS.  Patient would want to be resuscitated though may not want to be on a ventilator long-term.  Encouraged  continued conversations about what patient finds to be quality of life so that if he does end up on a ventilator, is that something he would want continuously moving forward with tracheostomy with his known underlying liver disease or would you want a time-limited trial and then potential palliative extubation.  Again noted patient does not have to make the decisions at this moment, most important thing is that patient talk to his wife so that she knows what he wants if these possibilities arrives.  Patient and wife acknowledged this. At this time, patient and wife are committed to patient improving with hope he can eventually become a liver transplant candidate.  I again discussed that patient can never have alcohol again if that is his goal.  Wife noted that her father who is a physician and patient's sister who works in liver transplant are also supportive of patient cessation of alcohol and working to get on the liver transplant list.  Acknowledged this and hoped that patient could accomplish this goal so that he can have better quality of life because he is so young.  Spent time providing emotional support via active listening.  Noted palliative medicine team continue to follow along with patient's medical journey.  Objective:   Vital Signs:  BP (!) 102/46   Pulse (!) 107   Temp 99.6 F (37.6 C) (Oral)   Resp 18   Ht 6' 1 (1.854 m)   Wt 62.6 kg   SpO2 95%   BMI 18.21 kg/m   Physical Exam: General: NAD, alert, chronically ill-appearing, cachectic, frail Eyes: Scleral icterus Cardiovascular: Tachycardia noted Respiratory: no increased work of breathing noted,  not in respiratory distress Neuro: A&Ox4, following commands easily Psych: appropriately answers all questions  Assessment & Plan:   Assessment: Patient is a 44 year old male with a past medical history of cirrhosis secondary to alcoholic liver disease and reported PTSD is a veteran who was admitted on 06/14/2024 for management of  shortness of breath and increasing jaundice.  During hospitalization, patient has received management for decompensated alcohol-related cirrhosis with associated coagulopathy, hyperbilirubinemia, portal hypertension, ascites, esophageal varices, as well as E. coli bacteremia and large left pleural effusion.  GI and pulmonology were consulted for recommendations.  Palliative medicine team consulted to assist with goals of care discussions.  Recommendations/Plan: # Complex medical decision making/goals of care:  - Discussed care with patient or wife at bedside as detailed above in HPI.  At this time continuing with aggressive medical interventions.  Patient and wife hopeful patient can abstain from alcohol and potentially be a candidate for liver transplant in the future.  Encouraged continued discussions regarding planning should patient medically deteriorate to determine what kind of care he would want if he could not speak for himself.  Noted palliative medicine team would continue to follow along with patient's medical journey.  -  Code Status: Full Code  # Psychosocial Support:  - Wife  # Discharge Planning: To Be Determined  Discussed with: Patient, wife, RN  Thank you for allowing the palliative care team to participate in the care [name].  Tinnie Radar, DO Palliative Care Provider PMT # [phone]  If patient remains symptomatic despite maximum doses, please call PMT at [phone] between 0700 and 1900. Outside of these hours, please call attending, as PMT does not have night coverage.  Personally spent 35 minutes in patient care including extensive chart review (labs, progress/consult notes, vital signs), medically appropraite exam, discussed with treatment team, education to patient and family, documenting clinical information, medication review, and coordination of care. "

## 2024-06-18 NOTE — Progress Notes (Signed)
" ° °  NAME:  Jeffrey Mathis, MRN:  [id number], DOB:  1981-05-12, LOS: 4 ADMISSION DATE:  06/14/2024, CONSULTATION DATE:  06/14/24 REFERRING MD:  Fredirick, CHIEF COMPLAINT:  SOB   History of Present Illness:  44 year old man with what appears to be end stage alcoholic liver cirrhosis followed at [facility] who presents with months of weight loss, weakness, and SOB.  Workup reveals AOC anemia, decompensated cirrhosis and large pleural effusion with tension features on imaging.  Pulmonary is consulted.  Patient is a poor historian so am relying on wife: she states breathing has been tough for him for very long time.  No recent trauma, no infectious symptoms.  No cough.  28 pack year smoker.  Pertinent  Medical History  ETOH cirrhosis  Significant Hospital Events: Including procedures, antibiotic start and stop dates in addition to other pertinent events   1/11 admit, left thoracentesis done with 2000 cc of bloody appearing fluid 1/12 E. coli bacteremia.  Started on antibiotics 1/13 getting daily blood unit transfusions.  GI is on board.  Increased diuretics  Interim History / Subjective:   Respiratory status is stable.  Remains on room air No new complaints  Objective    Blood pressure (!) 102/46, pulse (!) 107, temperature 99.6 F (37.6 C), temperature source Oral, resp. rate 18, height 6' 1 (1.854 m), weight 62.6 kg, SpO2 95%.        Intake/Output Summary (Last 24 hours) at 06/18/2024 1041 Last data filed at 06/18/2024 0745 Gross per 24 hour  Intake 120.98 ml  Output 500 ml  Net -379.02 ml   Filed Weights   06/14/24 1813  Weight: 62.6 kg    Examination: Ill-appearing, malnourished.  Jaundice Lungs are clear to auscultation with diminished air entry on the right Heart rate regular rate and rhythm Extremities with no edema  Labs/Imaging reviewed Significant for sodium 131, potassium 3.3 AST 67, ALT 22, total bilirubin 24.6 Hemoglobin 8 point  Pleural studies-5756 nucleated cells,  74% neutrophils.  Cultures negative CT chest abdomen pelvis reviewed with large left effusion with near collapse of left lung, cirrhosis with portal hypertension, thickening of cecum and ascending colon  Resolved problem list   Assessment and Plan  Large left pleural effusion status postthoracentesis Appears hemorrhagic and may be old hemothorax from spontaneous bleeding.  Denies any trauma Continue diuretics to mobilize fluid No need for repeat thoracentesis unless there is respiratory distress Follow final pleural studies  Sepsis, E. coli bacteremia Do not think that the source is from the pleural fluid Continue antibiotic  End-stage cirrhosis, portal hypertension Possible GI bleed GI is on board.  Continue PPI Transfuse as needed  PCCM will be available as needed.  Please call with questions  Signature:   Christionna Poland MD [facility] See Amion for pager  If no response to pager , please call [phone] until 7pm After 7:00 pm call Elink  [phone] 06/18/2024, 10:41 AM         "

## 2024-06-19 ENCOUNTER — Other Ambulatory Visit (HOSPITAL_COMMUNITY): Payer: Self-pay

## 2024-06-19 LAB — CULTURE, BLOOD (ROUTINE X 2)
Culture: NO GROWTH
Special Requests: ADEQUATE

## 2024-06-19 LAB — CBC WITH DIFFERENTIAL/PLATELET
Abs Immature Granulocytes: 0.17 K/uL — ABNORMAL HIGH (ref 0.00–0.07)
Basophils Absolute: 0.1 K/uL (ref 0.0–0.1)
Basophils Relative: 1 %
Eosinophils Absolute: 0.3 K/uL (ref 0.0–0.5)
Eosinophils Relative: 3 %
HCT: 24.7 % — ABNORMAL LOW (ref 39.0–52.0)
Hemoglobin: 8.9 g/dL — ABNORMAL LOW (ref 13.0–17.0)
Immature Granulocytes: 2 %
Lymphocytes Relative: 21 %
Lymphs Abs: 2.1 K/uL (ref 0.7–4.0)
MCH: 34 pg (ref 26.0–34.0)
MCHC: 36 g/dL (ref 30.0–36.0)
MCV: 94.3 fL (ref 80.0–100.0)
Monocytes Absolute: 2.4 K/uL — ABNORMAL HIGH (ref 0.1–1.0)
Monocytes Relative: 24 %
Neutro Abs: 4.8 K/uL (ref 1.7–7.7)
Neutrophils Relative %: 49 %
Platelets: 64 K/uL — ABNORMAL LOW (ref 150–400)
RBC: 2.62 MIL/uL — ABNORMAL LOW (ref 4.22–5.81)
RDW: 20.4 % — ABNORMAL HIGH (ref 11.5–15.5)
Smear Review: NORMAL
WBC: 9.8 K/uL (ref 4.0–10.5)
nRBC: 0.2 % (ref 0.0–0.2)

## 2024-06-19 LAB — BASIC METABOLIC PANEL WITH GFR
Anion gap: 13 (ref 5–15)
BUN: 14 mg/dL (ref 6–20)
CO2: 26 mmol/L (ref 22–32)
Calcium: 8.7 mg/dL — ABNORMAL LOW (ref 8.9–10.3)
Chloride: 95 mmol/L — ABNORMAL LOW (ref 98–111)
Creatinine, Ser: 0.91 mg/dL (ref 0.61–1.24)
GFR, Estimated: >60 mL/min
Glucose, Bld: 132 mg/dL — ABNORMAL HIGH (ref 70–99)
Potassium: 3.4 mmol/L — ABNORMAL LOW (ref 3.5–5.1)
Sodium: 134 mmol/L — ABNORMAL LOW (ref 135–145)

## 2024-06-19 LAB — PROTIME-INR
INR: 2.5 — ABNORMAL HIGH (ref 0.8–1.2)
Prothrombin Time: 28.4 s — ABNORMAL HIGH (ref 11.4–15.2)

## 2024-06-19 MED ORDER — LACTULOSE 10 GM/15ML PO SOLN: 20.0000 g | Freq: Two times a day (BID) | ORAL | 0 refills | Status: AC

## 2024-06-19 MED ORDER — ADULT MULTIVITAMIN W/MINERALS CH: 1.0000 | ORAL_TABLET | Freq: Every day | ORAL | 0 refills | Status: AC

## 2024-06-19 MED ORDER — LACTULOSE 10 GM/15ML PO SOLN
20.0000 g | Freq: Two times a day (BID) | ORAL | 0 refills | Status: DC
  Filled 2024-06-19: qty 473, 8d supply, fill #0

## 2024-06-19 MED ORDER — CEFADROXIL 500 MG PO CAPS: 1000.0000 mg | ORAL_CAPSULE | Freq: Two times a day (BID) | ORAL | 0 refills | Status: AC

## 2024-06-19 MED ORDER — METRONIDAZOLE 500 MG PO TABS: 500.0000 mg | ORAL_TABLET | Freq: Two times a day (BID) | ORAL | 0 refills | Status: AC

## 2024-06-19 MED ORDER — RIFAXIMIN 550 MG PO TABS: 550.0000 mg | ORAL_TABLET | Freq: Two times a day (BID) | ORAL | 0 refills | Status: AC

## 2024-06-19 MED ORDER — POTASSIUM CHLORIDE CRYS ER 20 MEQ PO TBCR
20.0000 meq | EXTENDED_RELEASE_TABLET | Freq: Every day | ORAL | 0 refills | Status: DC
  Filled 2024-06-19: qty 30, 30d supply, fill #0

## 2024-06-19 MED ORDER — FUROSEMIDE 40 MG PO TABS
40.0000 mg | ORAL_TABLET | Freq: Every day | ORAL | 0 refills | Status: DC
  Filled 2024-06-19: qty 90, 90d supply, fill #0

## 2024-06-19 MED ORDER — FOLIC ACID 1 MG PO TABS
1.0000 mg | ORAL_TABLET | Freq: Every day | ORAL | 0 refills | Status: DC
  Filled 2024-06-19: qty 30, 30d supply, fill #0

## 2024-06-19 MED ORDER — POTASSIUM CHLORIDE CRYS ER 20 MEQ PO TBCR: 20.0000 meq | EXTENDED_RELEASE_TABLET | Freq: Every day | ORAL | 0 refills | Status: AC

## 2024-06-19 MED ORDER — SPIRONOLACTONE 100 MG PO TABS
100.0000 mg | ORAL_TABLET | Freq: Every day | ORAL | 0 refills | Status: DC
  Filled 2024-06-19: qty 30, 30d supply, fill #0

## 2024-06-19 MED ORDER — VITAMIN B-1 100 MG PO TABS: 100.0000 mg | ORAL_TABLET | Freq: Every day | ORAL | 0 refills | Status: AC

## 2024-06-19 MED ORDER — PANTOPRAZOLE SODIUM 40 MG PO TBEC
40.0000 mg | DELAYED_RELEASE_TABLET | Freq: Every day | ORAL | 0 refills | Status: DC
  Filled 2024-06-19: qty 30, 30d supply, fill #0

## 2024-06-19 MED ORDER — METRONIDAZOLE 500 MG PO TABS
500.0000 mg | ORAL_TABLET | Freq: Two times a day (BID) | ORAL | 0 refills | Status: DC
  Filled 2024-06-19: qty 10, 5d supply, fill #0

## 2024-06-19 MED ORDER — RIFAXIMIN 550 MG PO TABS
550.0000 mg | ORAL_TABLET | Freq: Two times a day (BID) | ORAL | 0 refills | Status: DC
  Filled 2024-06-19: qty 60, 30d supply, fill #0

## 2024-06-19 MED ORDER — CEFDINIR 300 MG PO CAPS: 300.0000 mg | ORAL_CAPSULE | Freq: Two times a day (BID) | ORAL | 0 refills | Status: DC

## 2024-06-19 MED ORDER — FOLIC ACID 1 MG PO TABS: 1.0000 mg | ORAL_TABLET | Freq: Every day | ORAL | 0 refills | Status: AC

## 2024-06-19 MED ORDER — ADULT MULTIVITAMIN W/MINERALS CH
1.0000 | ORAL_TABLET | Freq: Every day | ORAL | 0 refills | Status: DC
  Filled 2024-06-19: qty 30, 30d supply, fill #0

## 2024-06-19 MED ORDER — PANTOPRAZOLE SODIUM 40 MG PO TBEC: 40.0000 mg | DELAYED_RELEASE_TABLET | Freq: Every day | ORAL | 0 refills | Status: AC

## 2024-06-19 MED ORDER — SPIRONOLACTONE 100 MG PO TABS: 100.0000 mg | ORAL_TABLET | Freq: Every day | ORAL | 0 refills | Status: AC

## 2024-06-19 MED ORDER — FUROSEMIDE 40 MG PO TABS: 40.0000 mg | ORAL_TABLET | Freq: Every day | ORAL | 0 refills | Status: AC

## 2024-06-19 MED ORDER — CEFDINIR 300 MG PO CAPS
300.0000 mg | ORAL_CAPSULE | Freq: Two times a day (BID) | ORAL | 0 refills | Status: DC
  Filled 2024-06-19: qty 10, 5d supply, fill #0

## 2024-06-19 MED ORDER — VITAMIN B-1 100 MG PO TABS
100.0000 mg | ORAL_TABLET | Freq: Every day | ORAL | 0 refills | Status: DC
  Filled 2024-06-19: qty 30, 30d supply, fill #0

## 2024-06-19 NOTE — Discharge Summary (Addendum)
 "  Physician Discharge Summary  Jeffrey Mathis FMW:[id number] DOB: [birth year] DOA: 06/14/2024  PCP: [facility]  Admit date: 06/14/2024 Discharge date: 06/19/2024  Admitted from: Home Discharge disposition: Home  Recommendations at discharge:  Stop alcohol use Ensure compliance with medications as prescribed Follow-up with GI as an outpatient   Subjective: Patient was seen and examined this morning. Lying on bed.  Not in distress.  Wife at bedside. Ambulated on the hallway today with PT Afebrile, hemodynamically stable, breathing on room air. Labs with hemoglobin stable at 8.9, sodium 134, potassium 3.4, BUN/creatinine within normal range.  INR stable at 2.5.  Brief narrative: Jeffrey Mathis is a 44 y.o. male with PMH significant for alcoholic liver cirrhosis, esophageal varices ongoing alcohol use, pancreatitis, PTSD He gets his usual care at [facility] and was recently referred to [facility] for possible need of liver transplant.  1/11, patient presented to ED with shortness of breath, worsening jaundice, intermittent melena and 30 pounds weight loss in 7 to 8 months.  In the ED, his oxygen saturation was low in 80s. Labs showed elevated alcohol level, lipase, INR, lactic acid, ammonia, LFTs with markedly elevated T. bili, low hemoglobin and low platelets. Chest x-ray showed large left pleural effusion Chest abdomen pelvis showed large left pleural effusion with near complete collapse of the left lung and mild mediastinal shift, mild ascites. PCCM did a bedside thoracentesis and aspirated 2 L of blood tinged fluid Was given blood transfusion. Admitted to [facility] PCCM, GI were consulted Stated above details  Hospital course: Large left pleural effusion hepatic hydrothorax/hemothorax Acute hypoxic respiratory failure  Presented with shortness of breath  Initial imagings as above showed large pleural effusion  Underwent bedside thoracentesis by PCCM and got 2 L blood-tinged  fluid drained Fluid analysis showed 72% neutrophils. Continue supplemental oxygen Recent Labs  Lab 06/14/24 1348 06/15/24 0400 06/16/24 0431 06/17/24 0441 06/18/24 0439 06/19/24 0431  WBC  --  8.7 11.7* 10.6* 9.8 9.8  LATICACIDVEN 2.6*  --   --   --  1.7  --     Sepsis POA E. coli bacteremia Fever spikes Blood culture sent on admission grew E. coli, pansensitive Likely intra-abdominal source. Treated with empiric antibiotics.  Completed course with 5 more days of oral cefadroxil  and metronidazole  at home.  Decompensated alcoholic liver cirrhosis Portal hypertension/ascites/Esophageal varices: Coagulopathy-thrombocytopenia, elevated INR Hyperammonemia MELD sodium score 31, INR elevated over 2, platelet level running low. GI consult was obtained Currently tolerating diuresis with IV Lasix .   Per GI recommendation, will discharge him on Lasix  40 mg daily, Aldactone  100 mg daily. Also continued on Protonix  40 mg nightly, lactulose  20 mg twice daily, Xifaxan  550 mg twice daily, folic acid , multivitamin, thiamine  Due to ongoing alcohol use will not be a candidate for liver transplant evaluation.  Counseled to quit alcohol.  Follow-up with GI as an outpatient. Overall his prognosis is poor, palliative care has been consulted Recent Labs  Lab 06/14/24 1335 06/14/24 1426 06/15/24 0400 06/16/24 0431 06/17/24 0441 06/18/24 0439 06/19/24 0431  AST 125*  --  97* 71* 72* 67*  --   ALT 28  --  23 20 23 22   --   ALKPHOS 140*  --  111 100 88 91  --   BILITOT 18.1*  --  20.7* 24.8* 28.3* 24.6*  --   PROT 5.9*  --  5.1* 5.1* 5.7* 5.6*  --   ALBUMIN  2.9*  --  2.4* 2.9* 3.6 3.4*  --   AMMONIA  38*  --   --   --   --   --   --   INR  --    < > 2.1* 2.9* 2.4* 2.3* 2.5*  LIPASE 92*  --   --   --   --   --   --   PLT 54*  --  47* 49* 52* 52* 64*   < > = values in this interval not displayed.   Acute on chronic blood loss anemia H/o esophageal varices Hemoglobin dropped to the lowest of  6 on 1/13, improved after blood transfusion.  8.1 this morning. Acute blood loss likely secondary to hemothorax.  FOBT negative Continue Protonix  Received total of 4 units PRBC transfusion so far Recent Labs    06/16/24 1902 06/17/24 0441 06/17/24 1102 06/18/24 0439 06/19/24 0431  HGB 6.6* 8.1* 8.1* 8.3* 8.9*  MCV  --  96.6  --  94.3 94.3   Ongoing chronic alcohol use  Had elevated blood alcohol level at presentation Monitored in the hospital with CIWA scale.  Not in withdrawal Counseled to quit alcohol absolutely  Chronic alcoholic pancreatitis Lipase is mildly elevated at 92. Cont diet as tolerated.   Hyponatremia Hypokalemia Mild.  Likely from cirrhosis, diuresis. On KCl 20 mEq daily.  To continue the same at discharge Recent Labs  Lab 06/14/24 1335 06/15/24 0400 06/16/24 0431 06/17/24 0441 06/18/24 0439 06/19/24 0431  NA 134* 131* 129* 132* 131* 134*  K 4.3 4.4 3.8 3.0* 3.3* 3.4*  CL 98 97* 97* 94* 96* 95*  CO2 23 23 23 23 24 26   GLUCOSE 121* 129* 129* 141* 116* 132*  BUN 8 19 25* 14 14 14   CREATININE 0.61 1.22 1.00 0.90 0.95 0.91  CALCIUM 8.4* 8.2* 8.4* 8.8* 8.6* 8.7*  MG 1.6*  --   --   --   --   --   PHOS 3.7  --   --   --   --   --    Tobacco use Counseled to quit.   Severe malnutrition  Body mass index is 18.21 kg/m. Augment diet as able.  Mobility: encourage ambulation.  Able to ambulate independently.  Goals of care   Code Status: Full Code   Diet:  Diet Order             Diet general           Diet Heart Room service appropriate? Yes; Fluid consistency: Thin  Diet effective now                   Nutritional status:  Body mass index is 18.21 kg/m.       Wounds:  -    Discharge Medications:   Allergies as of 06/19/2024   No Known Allergies      Medication List     STOP taking these medications    carvedilol  6.25 MG tablet Commonly known as: COREG    omeprazole 40 MG capsule Commonly known as:  PRILOSEC Replaced by: pantoprazole  40 MG tablet   ondansetron  4 MG disintegrating tablet Commonly known as: ZOFRAN -ODT   OVER THE COUNTER MEDICATION   oxyCODONE -acetaminophen  5-325 MG tablet Commonly known as: PERCOCET/ROXICET   Potassium Chloride  ER 20 MEQ Tbcr       TAKE these medications    cefadroxil  500 MG capsule Commonly known as: DURICEF Take 2 capsules (1,000 mg total) by mouth 2 (two) times daily for 5 days.   folic acid  1 MG tablet Commonly known as:  FOLVITE  Take 1 tablet (1 mg total) by mouth daily. Start taking on: June 20, 2024   furosemide  40 MG tablet Commonly known as: Lasix  Take 1 tablet (40 mg total) by mouth daily.   lactulose  10 GM/15ML solution Commonly known as: CHRONULAC  Take 30 mLs (20 g total) by mouth 2 (two) times daily. What changed:  when to take this reasons to take this   metroNIDAZOLE  500 MG tablet Commonly known as: FLAGYL  Take 1 tablet (500 mg total) by mouth every 12 (twelve) hours for 5 days.   multivitamin with minerals Tabs tablet Take 1 tablet by mouth daily. Start taking on: June 20, 2024   pantoprazole  40 MG tablet Commonly known as: PROTONIX  Take 1 tablet (40 mg total) by mouth at bedtime. Replaces: omeprazole 40 MG capsule   potassium chloride  SA 20 MEQ tablet Commonly known as: KLOR-CON  M Take 1 tablet (20 mEq total) by mouth daily. Start taking on: June 20, 2024   rifaximin  550 MG Tabs tablet Commonly known as: XIFAXAN  Take 1 tablet (550 mg total) by mouth 2 (two) times daily.   spironolactone  100 MG tablet Commonly known as: ALDACTONE  Take 1 tablet (100 mg total) by mouth daily. Start taking on: June 20, 2024 What changed:  medication strength how much to take when to take this   thiamine  100 MG tablet Commonly known as: Vitamin B-1 Take 1 tablet (100 mg total) by mouth daily. Start taking on: June 20, 2024         Follow ups:    Follow-up Information     [facility] Schedule an appointment as soon as possible for a visit today.   Why: Please called to schedule Hospital Discharge Follow-Up Appointment. Outpatient Physicial Therapy will be a referral by [facility]. Contact information: [address] [place] [address] [phone]                 Discharge Instructions:   Discharge Instructions     Call MD for:  difficulty breathing, headache or visual disturbances   Complete by: As directed    Call MD for:  extreme fatigue   Complete by: As directed    Call MD for:  hives   Complete by: As directed    Call MD for:  persistant dizziness or light-headedness   Complete by: As directed    Call MD for:  persistant nausea and vomiting   Complete by: As directed    Call MD for:  severe uncontrolled pain   Complete by: As directed    Call MD for:  temperature >100.4   Complete by: As directed    Diet general   Complete by: As directed    Discharge instructions   Complete by: As directed    Recommendations at discharge:   Stop alcohol use  Ensure compliance with medications as prescribed  Follow-up with GI as an outpatient  General discharge instructions: Follow with Primary MD [facility] in [facility] days  Please request your PCP  to go over your hospital tests, procedures, radiology results at the follow up. Please get your medicines reviewed and adjusted.  Your PCP may decide to repeat certain labs or tests as needed. Do not drive, operate heavy machinery, perform activities at heights, swimming or participation in water activities or provide baby sitting services if your were admitted for syncope or siezures until you have seen by Primary MD or a Neurologist and advised to do so again. [place]   Controlled Substance Reporting System database was reviewed. Do not drive, operate heavy machinery, perform activities at heights, swim, participate in water activities or provide baby-sitting  services while on medications for pain, sleep and mood until your outpatient physician has reevaluated you and advised to do so again.  You are strongly recommended to comply with the dose, frequency and duration of prescribed medications. Activity: As tolerated with Full fall precautions use walker/cane & assistance as needed Avoid using any recreational substances like cigarette, tobacco, alcohol, or non-prescribed drug. If you experience worsening of your admission symptoms, develop shortness of breath, life threatening emergency, suicidal or homicidal thoughts you must seek medical attention immediately by calling 911 or calling your MD immediately  if symptoms less severe. You must read complete instructions/literature along with all the possible adverse reactions/side effects for all the medicines you take and that have been prescribed to you. Take any new medicine only after you have completely understood and accepted all the possible adverse reactions/side effects.  Wear Seat belts while driving. You were cared for by a hospitalist during your hospital stay. If you have any questions about your discharge medications or the care you received while you were in the hospital after you are discharged, you can call the unit and ask to speak with the hospitalist or the covering physician. Once you are discharged, your primary care physician will handle any further medical issues. Please note that NO REFILLS for any discharge medications will be authorized once you are discharged, as it is imperative that you return to your primary care physician (or establish a relationship with a primary care physician if you do not have one).   Increase activity slowly   Complete by: As directed        Discharge Exam:   Vitals:   06/18/24 0450 06/18/24 1606 06/18/24 1916 06/19/24 0523  BP: (!) 102/46 109/60 110/61 105/63  Pulse: (!) 107 93 97 99  Resp:  15 18 16   Temp: 99.6 F (37.6 C) 98.3 F (36.8 C) 98.4  F (36.9 C) 99.1 F (37.3 C)  TempSrc: Oral  Oral Oral  SpO2: 95% 98% 98% 95%  Weight:      Height:        Body mass index is 18.21 kg/m.   General exam: Pleasant, young Caucasian male.  Looks older than his stated age Skin: No rashes, lesions or ulcers. jaundiced HEENT: Atraumatic, normocephalic, no obvious bleeding Lungs: Clear to auscultation bilaterally,  CVS: S1, S2, no murmur,   GI/Abd: Soft, nontender, nondistended, bowel sound present,   CNS: Alert, awake, oriented x 3 Psychiatry: Sad affect Extremities: No pedal edema, no calf tenderness,    The results of significant diagnostics from this hospitalization (including imaging, microbiology, ancillary and laboratory) are listed below for reference.    Procedures and Diagnostic Studies:   DG CHEST PORT 1 VIEW Result Date: 06/15/2024 CLINICAL DATA:  Follow-up pleural effusion. EXAM: PORTABLE CHEST 1 VIEW COMPARISON:  Chest radiograph dated 06/14/2024. FINDINGS: Similar or slightly increased left pleural effusion with left lung base atelectasis or infiltrate. No pneumothorax. Stable cardiac silhouette. No acute osseous pathology. IMPRESSION: Similar or slightly increased left pleural effusion with left lung base atelectasis or infiltrate. Electronically Signed   By: Vanetta Chou M.D.   On: 06/15/2024 18:52   DG CHEST PORT 1 VIEW Result Date: 06/14/2024 CLINICAL DATA:  Left pleural effusion status post thoracentesis EXAM: PORTABLE CHEST 1 VIEW COMPARISON:  06/14/2024 FINDINGS: Single frontal view of the chest demonstrates  an unremarkable cardiac silhouette. Decreased left pleural effusion after interval thoracentesis, with small residual left pleural effusion and persistent left basilar consolidation. Right chest is clear. No pneumothorax. IMPRESSION: 1. No complication after left thoracentesis. Decreased left pleural effusion with persistent left basilar consolidation. Electronically Signed   By: Ozell Daring M.D.   On:  06/14/2024 23:04   CT CHEST ABDOMEN PELVIS W CONTRAST Result Date: 06/14/2024 EXAM: CT CHEST, ABDOMEN AND PELVIS WITH CONTRAST 06/14/2024 04:52:22 PM TECHNIQUE: CT of the chest, abdomen and pelvis was performed with the administration of 100 mL of iohexol  (OMNIPAQUE ) 300 MG/ML solution. Multiplanar reformatted images are provided for review. Automated exposure control, iterative reconstruction, and/or weight based adjustment of the mA/kV was utilized to reduce the radiation dose to as low as reasonably achievable. 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 Left upper quadrant abdominal pain, chest pain, dyspnea, hypoxia, jaundice. FINDINGS: CHEST: MEDIASTINUM AND LYMPH NODES: Heart and pericardium are unremarkable. The central airways are clear. No mediastinal, hilar or axillary lymphadenopathy. 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 Mild mediastinal shift to the right. LUNGS AND PLEURA: Large left pleural effusion is present with near complete collapse of the left lung. Mild right basilar dependent atelectasis. Mild emphysema. No pneumothorax. No pleural effusion on the right. ABDOMEN AND PELVIS: LIVER: The portal vein is patent. The hepatic artery is hypertrophied in keeping with chronic changes of underlying cirrhosis. As noted above, extensive gastroesophageal varices were identified in keeping with changes related to portal venous hypertension. GALLBLADDER AND BILE DUCTS: Punctate mural calcification of the gallbladder is nonspecific. Gallbladder wall thickening is nonspecific in the setting of cirrhosis and ascites. No biliary ductal dilatation. SPLEEN: Small splenomegaly is present with the spleen measuring 14.6 cm in greatest dimension. No intrasplenic lesions identified. PANCREAS: No acute abnormality. ADRENAL GLANDS: No acute abnormality. KIDNEYS, URETERS AND BLADDER: No stones in the kidneys or ureters.  No hydronephrosis. No perinephric or periureteral stranding. Urinary bladder is unremarkable. GI AND BOWEL: Stomach demonstrates no acute abnormality. There is circumferential marked wall thickening and submucosal edema involving the cecum and ascending colon which may reflect changes of portal colopathy. Note that infectious or inflammatory colitis could appear similarly, however. There is no bowel obstruction. REPRODUCTIVE ORGANS: No acute abnormality. PERITONEUM AND RETROPERITONEUM: Mild ascites. No free air. VASCULATURE: Aorta is normal in caliber. ABDOMINAL AND PELVIS LYMPH NODES: No lymphadenopathy. BONES AND SOFT TISSUES: No acute osseous abnormality. No focal soft tissue abnormality. IMPRESSION: 1. Large left pleural effusion with near complete collapse of the left lung and mild mediastinal shift to the right. 2. Cirrhosis with portal hypertension manifested by extensive gastroesophageal varices, mild splenomegaly, mild ascites, and hypertrophied hepatic artery. 3. Circumferential marked wall thickening and submucosal edema involving the cecum and ascending colon, which may reflect portal colopathy, with infectious or inflammatory colitis in the differential. 4. Raf score includes emphysema ([id number]). Electronically signed by: Dorethia Molt MD MD 06/14/2024 05:24 PM EST RP Workstation: [id number]   DG Chest Port 1 View Result Date: 06/14/2024 CLINICAL DATA:  Left-sided chest pain. EXAM: PORTABLE CHEST 1 VIEW COMPARISON:  June 10, 2022 FINDINGS: The heart size and mediastinal contours are within normal limits. There is a large left-sided pleural effusion. Marked severity underlying airspace disease is suspected within the mid right lung and left lung base. The right lung is clear. No pneumothorax is identified. The visualized skeletal structures are unremarkable. IMPRESSION: Large left-sided pleural effusion with marked severity underlying airspace disease  suspected within the mid right lung and left  lung base. Correlation with nonemergent chest CT is recommended to further exclude the presence of an underlying neoplastic process. Electronically Signed   By: Suzen Dials M.D.   On: 06/14/2024 13:43     Labs:   Basic Metabolic Panel: Recent Labs  Lab 06/14/24 1335 06/15/24 0400 06/16/24 0431 06/17/24 0441 06/18/24 0439 06/19/24 0431  NA 134* 131* 129* 132* 131* 134*  K 4.3 4.4 3.8 3.0* 3.3* 3.4*  CL 98 97* 97* 94* 96* 95*  CO2 23 23 23 23 24 26   GLUCOSE 121* 129* 129* 141* 116* 132*  BUN 8 19 25* 14 14 14   CREATININE 0.61 1.22 1.00 0.90 0.95 0.91  CALCIUM 8.4* 8.2* 8.4* 8.8* 8.6* 8.7*  MG 1.6*  --   --   --   --   --   PHOS 3.7  --   --   --   --   --    GFR Estimated Creatinine Clearance: 94.6 mL/min (by C-G formula based on SCr of 0.91 mg/dL). Liver Function Tests: Recent Labs  Lab 06/14/24 1335 06/15/24 0400 06/16/24 0431 06/17/24 0441 06/18/24 0439  AST 125* 97* 71* 72* 67*  ALT 28 23 20 23 22   ALKPHOS 140* 111 100 88 91  BILITOT 18.1* 20.7* 24.8* 28.3* 24.6*  PROT 5.9* 5.1* 5.1* 5.7* 5.6*  ALBUMIN  2.9* 2.4* 2.9* 3.6 3.4*   Recent Labs  Lab 06/14/24 1335  LIPASE 92*   Recent Labs  Lab 06/14/24 1335  AMMONIA 38*   Coagulation profile Recent Labs  Lab 06/15/24 0400 06/16/24 0431 06/17/24 0441 06/18/24 0439 06/19/24 0431  INR 2.1* 2.9* 2.4* 2.3* 2.5*    CBC: Recent Labs  Lab 06/14/24 1335 06/15/24 0400 06/15/24 1240 06/16/24 0431 06/16/24 1902 06/17/24 0441 06/17/24 1102 06/18/24 0439 06/19/24 0431  WBC 12.6* 8.7  --  11.7*  --  10.6*  --  9.8 9.8  NEUTROABS 10.0*  --   --   --   --   --   --  5.6 4.8  HGB 6.7* 7.9*   < > 6.0* 6.6* 8.1* 8.1* 8.3* 8.9*  HCT 19.5* 21.9*   < > 17.0* 18.5* 22.4* 22.4* 23.2* 24.7*  MCV 111.4* 100.5*  --  101.2*  --  96.6  --  94.3 94.3  PLT 54* 47*  --  49*  --  52*  --  52* 64*   < > = values in this interval not displayed.   Cardiac Enzymes: No results for input(s): CKTOTAL, CKMB,  CKMBINDEX, TROPONINI in the last 168 hours. BNP: Invalid input(s): POCBNP CBG: No results for input(s): GLUCAP in the last 168 hours. D-Dimer No results for input(s): DDIMER in the last 72 hours. Hgb A1c No results for input(s): HGBA1C in the last 72 hours. Lipid Profile No results for input(s): CHOL, HDL, LDLCALC, TRIG, CHOLHDL, LDLDIRECT in the last 72 hours. Thyroid function studies No results for input(s): TSH, T4TOTAL, T3FREE, THYROIDAB in the last 72 hours.  Invalid input(s): FREET3 Anemia work up No results for input(s): VITAMINB12, FOLATE, FERRITIN, TIBC, IRON, RETICCTPCT in the last 72 hours. Microbiology Recent Results (from the past 240 hours)  Culture, blood (routine x 2)     Status: None   Collection Time: 06/14/24  1:35 PM   Specimen: BLOOD  Result Value Ref Range Status   Specimen Description   Final    BLOOD LEFT ANTECUBITAL Performed at [facility], [address] [place], [place] [address]    Special Requests   Final    BOTTLES DRAWN AEROBIC AND ANAEROBIC Blood Culture adequate volume Performed at [facility], [address], [place], [place] [address]    Culture   Final    NO GROWTH 5 DAYS Performed at [facility], [address] [place], [place] [address]    Report Status 06/19/2024 FINAL  Final  Culture, blood (routine x 2)     Status: Abnormal   Collection Time: 06/14/24  7:02 PM   Specimen: BLOOD RIGHT ARM  Result Value Ref Range Status   Specimen Description   Final    BLOOD RIGHT ARM Performed at [facility], [address] [place], [place] [address]    Special Requests   Final    BOTTLES DRAWN AEROBIC ONLY Blood Culture adequate volume Performed at [facility], [address], [place], [place] [address]    Culture  Setup Time   Final    GRAM NEGATIVE RODS AEROBIC BOTTLE ONLY CRITICAL RESULT CALLED TO, READ BACK BY AND  VERIFIED WITH: PHARMD Crystal R on [id number] @1259  by SM Performed at [facility], [address], [place], [place] [address]    Culture ESCHERICHIA COLI (A)  Final   Report Status 06/17/2024 FINAL  Final   Organism ID, Bacteria ESCHERICHIA COLI  Final      Susceptibility   Escherichia coli - MIC*    AMPICILLIN 4 SENSITIVE Sensitive     CEFAZOLIN (NON-URINE) <=1 SENSITIVE Sensitive     CEFEPIME <=0.12 SENSITIVE Sensitive     ERTAPENEM <=0.12 SENSITIVE Sensitive     CEFTRIAXONE  <=0.25 SENSITIVE Sensitive     CIPROFLOXACIN 0.5 INTERMEDIATE Intermediate     GENTAMICIN <=1 SENSITIVE Sensitive     MEROPENEM <=0.25 SENSITIVE Sensitive     TRIMETH/SULFA <=20 SENSITIVE Sensitive     AMPICILLIN/SULBACTAM <=2 SENSITIVE Sensitive     PIP/TAZO Value in next row Sensitive      <=4 SENSITIVEThis is a modified FDA-approved test that has been validated and its performance characteristics determined by the reporting laboratory.  This laboratory is certified under the Clinical Laboratory Improvement Amendments CLIA as qualified to perform high complexity clinical laboratory testing.    * ESCHERICHIA COLI  Blood Culture ID Panel (Reflexed)     Status: Abnormal   Collection Time: 06/14/24  7:02 PM  Result Value Ref Range Status   Enterococcus faecalis NOT DETECTED NOT DETECTED Final   Enterococcus Faecium NOT DETECTED NOT DETECTED Final   Listeria monocytogenes NOT DETECTED NOT DETECTED Final   Staphylococcus species NOT DETECTED NOT DETECTED Final   Staphylococcus aureus (BCID) NOT DETECTED NOT DETECTED Final   Staphylococcus epidermidis NOT DETECTED NOT DETECTED Final   Staphylococcus lugdunensis NOT DETECTED NOT DETECTED Final   Streptococcus species NOT DETECTED NOT DETECTED Final   Streptococcus agalactiae NOT DETECTED NOT DETECTED Final   Streptococcus pneumoniae NOT DETECTED NOT DETECTED Final   Streptococcus pyogenes NOT DETECTED NOT DETECTED Final   A.calcoaceticus-baumannii NOT DETECTED  NOT DETECTED Final   Bacteroides fragilis NOT DETECTED NOT DETECTED Final   Enterobacterales DETECTED (A) NOT DETECTED Final    Comment: Enterobacterales represent a large order of gram negative bacteria, not a single organism. CRITICAL RESULT CALLED TO, READ BACK BY AND VERIFIED WITH: PHARMD Crystal R on [id number] @1259  by SM    Enterobacter cloacae complex NOT DETECTED NOT DETECTED Final   Escherichia coli DETECTED (A) NOT  DETECTED Final    Comment: CRITICAL RESULT CALLED TO, READ BACK BY AND VERIFIED WITH: PHARMD Crystal R on [id number] @1259  by SM    Klebsiella aerogenes NOT DETECTED NOT DETECTED Final   Klebsiella oxytoca NOT DETECTED NOT DETECTED Final   Klebsiella pneumoniae NOT DETECTED NOT DETECTED Final   Proteus species NOT DETECTED NOT DETECTED Final   Salmonella species NOT DETECTED NOT DETECTED Final   Serratia marcescens NOT DETECTED NOT DETECTED Final   Haemophilus influenzae NOT DETECTED NOT DETECTED Final   Neisseria meningitidis NOT DETECTED NOT DETECTED Final   Pseudomonas aeruginosa NOT DETECTED NOT DETECTED Final   Stenotrophomonas maltophilia NOT DETECTED NOT DETECTED Final   Candida albicans NOT DETECTED NOT DETECTED Final   Candida auris NOT DETECTED NOT DETECTED Final   Candida glabrata NOT DETECTED NOT DETECTED Final   Candida krusei NOT DETECTED NOT DETECTED Final   Candida parapsilosis NOT DETECTED NOT DETECTED Final   Candida tropicalis NOT DETECTED NOT DETECTED Final   Cryptococcus neoformans/gattii NOT DETECTED NOT DETECTED Final   CTX-M ESBL NOT DETECTED NOT DETECTED Final   Carbapenem resistance IMP NOT DETECTED NOT DETECTED Final   Carbapenem resistance KPC NOT DETECTED NOT DETECTED Final   Carbapenem resistance NDM NOT DETECTED NOT DETECTED Final   Carbapenem resist OXA 48 LIKE NOT DETECTED NOT DETECTED Final   Carbapenem resistance VIM NOT DETECTED NOT DETECTED Final    Comment: Performed at [facility], [address], [place], [place]  [address]  Body fluid culture w Gram Stain     Status: None   Collection Time: 06/14/24 10:52 PM   Specimen: Pleural Fluid  Result Value Ref Range Status   Specimen Description   Final    PLEURAL Performed at [facility], [address], [place], [place] [address]    Special Requests   Final    NONE Performed at [facility], [address] [place], [place] [address]    Gram Stain   Final    WBC PRESENT, PREDOMINANTLY PMN NO ORGANISMS SEEN CYTOSPIN SMEAR    Culture   Final    NO GROWTH 3 DAYS Performed at [facility], [address] [place], [place] [address]    Report Status 06/18/2024 FINAL  Final    Time coordinating discharge: 45 minutes  Signed: Nancey Kreitz  [facility] 06/19/2024, 3:31 PM   "

## 2024-06-19 NOTE — Progress Notes (Signed)
 " Daily Progress Note   Patient Name: Jeffrey Mathis       Date: 06/19/2024 DOB: 1980/12/28  Age: 44 y.o. MRN#: [id number] Attending Physician: Arlice Reichert, MD Primary Care Physician: [facility] Admit Date: 06/14/2024 Length of Stay: 5 days  Reason for Follow-up: Establishing goals of care  Subjective:   CC: Patient notes feeling weak today.  Following up regarding goals of care.  Reviewed patient's CBC from today noting hemoglobin stable at 8.9 and platelets stable at 64.  Patient's INR today noted to be 2.5. Discussed care with bedside RN for medical updates.  Presented to bedside to see patient and wife.  Patient laying in bed noting that he just feels weak.  Wife updated this provider about plan for discharge today.  Wife and patient concerned about discharge with patient feeling weak at this time.  Wife also expressed concern about [facility] to make sure patient has appropriate follow-up.  Wife particularly wants patient to be strong enough to be able to get outpatient counseling for his alcoholism so that he can become a liver transplant candidate with abstaining from alcohol.  Acknowledged concerns and noted would reach out to hospitalist at this.  Asked hospitalist about recommendation to evaluation by PT to determine if patient needs further support from home health involvement.  Spent time providing emotional support via active listening with their concerns at this time.  Noted palliative medicine team will continue to follow along with patient's medical journey.  Discussed care with hospitalist, TOC, and RN regarding patient's and wife's concerns.  PT was consulted for evaluation.  Reviewed PT's note from today recommending outpatient PT for further strengthening and a rollator for community ambulation to allow patient rest at times due to his state of being easily fatigued.  Hospitalist coordinating recommendations.  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  During hospitalization, patient has received management for decompensated alcohol-related cirrhosis with associated coagulopathy, hyperbilirubinemia, portal hypertension, ascites, esophageal varices, as well as E. coli bacteremia and large left pleural effusion.  GI and pulmonology were consulted for recommendations.  Palliative medicine team consulted to assist with goals of care discussions.  Recommendations/Plan: # Complex medical decision making/goals of care:  - Discussed care with patient or wife at bedside as detailed above in HPI.  Patient noting weakness that this time and concerns related to it.  Informed hospitalist who was able to have PT evaluate and recommended outpatient PT to regain strength.  Patient and wife are wanting to continue with aggressive medical interventions with hope patient can abstain from alcohol and potentially be a candidate for liver transplant in the future.  Encouraged continued discussions moving forward regarding planning should patient medically deteriorate to determine what kind of care he would want if  he could not speak for himself.  Noted palliative medicine team would continue to follow along with patient's medical journey.  -  Code Status: Full Code  #  Psychosocial Support:  - Wife  # Discharge Planning: Home with Home Health  Discussed with: Patient, wife, RN, hospitalist, [facility]  Thank you for allowing the palliative care team to participate in the care [name].  Tinnie Radar, DO Palliative Care Provider PMT # [phone]  If patient remains symptomatic despite maximum doses, please call PMT at [phone] between 0700 and 1900. Outside of these hours, please call attending, as PMT does not have night coverage.  Personally spent 30 minutes in patient care including extensive chart review (labs, progress/consult notes, vital signs), medically appropraite exam, discussed with treatment team, education to patient and family, documenting clinical information, medication review, and coordination of care. "

## 2024-06-19 NOTE — Plan of Care (Signed)

## 2024-06-19 NOTE — Evaluation (Signed)
 Physical Therapy Evaluation Patient Details Name: Jeffrey Mathis MRN: [id number] DOB: 19-Apr-1981 Today's Date: 06/19/2024  History of Present Illness  Pt is 44 yo male admitted on 06/14/24 with sepsis, ecoli bacteremia, end stage liver cirrhosis, and acute on chronic anemia. Additionally found to have large L pleural effusion and s/p thoracentesis on 1/11 with 2000 cc bloody appearing fluid.. Pt with hx including but not limited to alcoholic liver cirrhosis, esophageal varices ongoing alcohol use, pancreatitis, PTSD  Clinical Impression  Pt admitted with above diagnosis. At baseline pt independent and working.  He lives with wife in split level home.  Today, pt did ambulate 200' then 150' and performed 11 steps at close supervision level and with rest breaks between bouts of ambulation.  Pt did fatigue easily and had some mild tremors/shaking with fatigue but is performing household mobility at supervision level.  Recommend outpt PT for further strengthening. Additionally, would benefit from rollator for community ambulation to allow for rest breaks. Pt currently with functional limitations due to the deficits listed below (see PT Problem List). Pt will benefit from acute skilled PT to increase their independence and safety with mobility to allow discharge.           If plan is discharge home, recommend the following: A little help with walking and/or transfers;A little help with bathing/dressing/bathroom;Assistance with cooking/housework;Help with stairs or ramp for entrance   Can travel by private vehicle        Equipment Recommendations Rollator (4 wheels)  Recommendations for Other Services       Functional Status Assessment Patient has had a recent decline in their functional status and demonstrates the ability to make significant improvements in function in a reasonable and predictable amount of time.     Precautions / Restrictions Precautions Precautions: Fall      Mobility   Bed Mobility Overal bed mobility: Independent                  Transfers Overall transfer level: Needs assistance Equipment used: None Transfers: Sit to/from Stand Sit to Stand: Independent                Ambulation/Gait Ambulation/Gait assistance: Supervision Gait Distance (Feet): 200 Feet (200' then 150') Assistive device: None Gait Pattern/deviations: Step-through pattern Gait velocity: functional     General Gait Details: Has been ambulating in room independently.  Ambulated 200' in hallway, seated rest break then 150' to stairs and back.  Pt at supervision level.  With fatigue (after 150') he did have some mild tremors/shaking but no overt LOB.  Stairs Stairs: Yes Stairs assistance: Supervision Stair Management: One rail Left, Alternating pattern, Forwards Number of Stairs: 11 General stair comments: Close supervision ; gait belt donned; educated on step to pattern if needed but did with alternating pattern; steady with rail  Wheelchair Mobility     Tilt Bed    Modified Rankin (Stroke Patients Only)       Balance Overall balance assessment: Needs assistance Sitting-balance support: No upper extremity supported Sitting balance-Leahy Scale: Normal     Standing balance support: No upper extremity supported Standing balance-Leahy Scale: Good Standing balance comment: ambulating wtihout UE support                             Pertinent Vitals/Pain Pain Assessment Pain Assessment: No/denies pain    Home Living Family/patient expects to be discharged to:: Private residence Living Arrangements: Spouse/significant other Available Help at  Discharge: Family;Available 24 hours/day Type of Home: House Home Access: Stairs to enter Entrance Stairs-Rails: Left Entrance Stairs-Number of Steps: 3 Alternate Level Stairs-Number of Steps: Split level : 5 steps between each level Home Layout: Multi-level Home Equipment: None      Prior Function  Prior Level of Function : Independent/Modified Independent;Working/employed;Driving             Mobility Comments: Could ambulate in community without AD ADLs Comments: Independent with adls and iadls     Extremity/Trunk Assessment   Upper Extremity Assessment Upper Extremity Assessment: Overall WFL for tasks assessed    Lower Extremity Assessment Lower Extremity Assessment: Overall WFL for tasks assessed    Cervical / Trunk Assessment Cervical / Trunk Assessment: Normal  Communication        Cognition Arousal: Alert Behavior During Therapy: WFL for tasks assessed/performed   PT - Cognitive impairments: No apparent impairments                                 Cueing       General Comments General comments (skin integrity, edema, etc.): VSS;  Discussed further PT for strengthening and pt is in agreement.  Prefers outpt over [facility]    Exercises     Assessment/Plan    PT Assessment Patient needs continued PT services  PT Problem List Decreased strength;Decreased activity tolerance;Decreased balance;Decreased mobility;Decreased knowledge of use of DME       PT Treatment Interventions DME instruction;Therapeutic exercise;Gait training;Stair training;Functional mobility training;Therapeutic activities;Patient/family education;Neuromuscular re-education;Balance training    PT Goals (Current goals can be found in the Care Plan section)  Acute Rehab PT Goals Patient Stated Goal: regain strength PT Goal Formulation: With patient Time For Goal Achievement: 07/03/24 Potential to Achieve Goals: Good    Frequency Min 1X/week     Co-evaluation               AM-PAC PT 6 Clicks Mobility  Outcome Measure Help needed turning from your back to your side while in a flat bed without using bedrails?: None Help needed moving from lying on your back to sitting on the side of a flat bed without using bedrails?: None Help needed moving to and from a bed to a  chair (including a wheelchair)?: None Help needed standing up from a chair using your arms (e.g., wheelchair or bedside chair)?: None Help needed to walk in hospital room?: A Little Help needed climbing 3-5 steps with a railing? : A Little 6 Click Score: 22    End of Session Equipment Utilized During Treatment: Gait belt Activity Tolerance: Patient tolerated treatment well Patient left: in bed;with call bell/phone within reach Nurse Communication: Mobility status PT Visit Diagnosis: Other abnormalities of gait and mobility (R26.89);Muscle weakness (generalized) (M62.81)    Time: 8751-8693 PT Time Calculation (min) (ACUTE ONLY): 18 min   Charges:   PT Evaluation $PT Eval Low Complexity: 1 Low   PT General Charges $$ ACUTE PT VISIT: 1 Visit         Benjiman, PT Acute Rehab [facility] Rehab [phone]   Benjiman VEAR Mulberry 06/19/2024, 1:19 PM

## 2024-06-19 NOTE — TOC Progression Note (Addendum)
 Transition of Care [facility]) - Progression Note    Patient Details  Name: Jeffrey Mathis MRN: [id number] Date of Birth: [birth year]  [facility] CM/SW Contact  Doneta Glenys DASEN, RN Phone Number: 06/19/2024, 11:59 AM  Clinical Narrative:    1:51 PM CM sent email to [email or web address] for DME - rollator. CM requested follow up for delivery for today or to patients home. CM called [facility] [phone] ext. [phone] to inform the need for DME- Rollator and PT outpatient. 1:31 PM CM  returned call to Dorothyann Rocher [facility] [phone] ext [phone] - left message. 11:59 AM [facility] with [facility]. Patient has been added to list for inpatient rehab. CM has requested services for comfortsupport for wife.   Expected Discharge Plan: Home w Home Health Services Barriers to Discharge: Continued Medical Work up               Expected Discharge Plan and Services In-house Referral: NA Discharge Planning Services: CM Consult Post Acute Care Choice: NA Living arrangements for the past 2 months: Single Family Home                 DME Arranged: N/A DME Agency: NA       HH Arranged: NA HH Agency: NA         Social Drivers of Health (SDOH) Interventions SDOH Screenings   Food Insecurity: No Food Insecurity (06/14/2024)  Housing: Unknown (06/17/2024)  Recent Concern: Housing - High Risk (06/14/2024)  Transportation Needs: No Transportation Needs (06/14/2024)  Utilities: Not At Risk (06/14/2024)  Tobacco Use: High Risk (06/17/2024)    Readmission Risk Interventions    05/02/2023    9:51 AM  Readmission Risk Prevention Plan  Post Dischage Appt Complete  Medication Screening Complete  Transportation Screening Complete

## 2024-06-24 LAB — GLUCOSE, BODY FLUID OTHER: Glucose, Body Fluid Other: <2 mg/dL

## 2024-06-25 ENCOUNTER — Encounter (HOSPITAL_COMMUNITY): Payer: Self-pay

## 2024-06-25 ENCOUNTER — Inpatient Hospital Stay (HOSPITAL_COMMUNITY)
Admission: EM | Admit: 2024-06-25 | Discharge: 2024-07-03 | DRG: 186 | Disposition: A | Discharge status: Home / Self Care | Attending: Internal Medicine | Admitting: Internal Medicine

## 2024-06-25 ENCOUNTER — Emergency Department (HOSPITAL_COMMUNITY)

## 2024-06-25 ENCOUNTER — Other Ambulatory Visit: Payer: Self-pay

## 2024-06-25 DIAGNOSIS — Z79899 Other long term (current) drug therapy: Secondary | ICD-10-CM

## 2024-06-25 DIAGNOSIS — J9811 Atelectasis: Secondary | ICD-10-CM | POA: Diagnosis present

## 2024-06-25 DIAGNOSIS — I8500 Esophageal varices without bleeding: Secondary | ICD-10-CM | POA: Diagnosis present

## 2024-06-25 DIAGNOSIS — J90 Pleural effusion, not elsewhere classified: Principal | ICD-10-CM | POA: Diagnosis present

## 2024-06-25 DIAGNOSIS — I864 Gastric varices: Secondary | ICD-10-CM | POA: Diagnosis present

## 2024-06-25 DIAGNOSIS — E871 Hypo-osmolality and hyponatremia: Secondary | ICD-10-CM | POA: Diagnosis present

## 2024-06-25 DIAGNOSIS — J942 Hemothorax: Secondary | ICD-10-CM

## 2024-06-25 DIAGNOSIS — D689 Coagulation defect, unspecified: Secondary | ICD-10-CM | POA: Diagnosis present

## 2024-06-25 DIAGNOSIS — K5900 Constipation, unspecified: Secondary | ICD-10-CM

## 2024-06-25 DIAGNOSIS — Z1152 Encounter for screening for COVID-19: Secondary | ICD-10-CM | POA: Diagnosis not present

## 2024-06-25 DIAGNOSIS — Z7682 Awaiting organ transplant status: Secondary | ICD-10-CM

## 2024-06-25 DIAGNOSIS — I8510 Secondary esophageal varices without bleeding: Secondary | ICD-10-CM | POA: Diagnosis present

## 2024-06-25 DIAGNOSIS — R161 Splenomegaly, not elsewhere classified: Secondary | ICD-10-CM

## 2024-06-25 DIAGNOSIS — J439 Emphysema, unspecified: Secondary | ICD-10-CM | POA: Diagnosis present

## 2024-06-25 DIAGNOSIS — D6959 Other secondary thrombocytopenia: Secondary | ICD-10-CM | POA: Diagnosis present

## 2024-06-25 DIAGNOSIS — D72829 Elevated white blood cell count, unspecified: Secondary | ICD-10-CM | POA: Diagnosis not present

## 2024-06-25 DIAGNOSIS — R7881 Bacteremia: Secondary | ICD-10-CM | POA: Diagnosis not present

## 2024-06-25 DIAGNOSIS — K828 Other specified diseases of gallbladder: Secondary | ICD-10-CM | POA: Diagnosis present

## 2024-06-25 DIAGNOSIS — J189 Pneumonia, unspecified organism: Secondary | ICD-10-CM

## 2024-06-25 DIAGNOSIS — K766 Portal hypertension: Secondary | ICD-10-CM | POA: Diagnosis present

## 2024-06-25 DIAGNOSIS — F1011 Alcohol abuse, in remission: Secondary | ICD-10-CM | POA: Diagnosis present

## 2024-06-25 DIAGNOSIS — D539 Nutritional anemia, unspecified: Secondary | ICD-10-CM | POA: Diagnosis present

## 2024-06-25 DIAGNOSIS — R188 Other ascites: Secondary | ICD-10-CM

## 2024-06-25 DIAGNOSIS — K7031 Alcoholic cirrhosis of liver with ascites: Secondary | ICD-10-CM | POA: Diagnosis present

## 2024-06-25 DIAGNOSIS — F17210 Nicotine dependence, cigarettes, uncomplicated: Secondary | ICD-10-CM | POA: Diagnosis present

## 2024-06-25 DIAGNOSIS — K861 Other chronic pancreatitis: Secondary | ICD-10-CM | POA: Diagnosis present

## 2024-06-25 DIAGNOSIS — K7210 Chronic hepatic failure without coma: Secondary | ICD-10-CM | POA: Diagnosis present

## 2024-06-25 DIAGNOSIS — R64 Cachexia: Secondary | ICD-10-CM | POA: Diagnosis present

## 2024-06-25 DIAGNOSIS — K219 Gastro-esophageal reflux disease without esophagitis: Secondary | ICD-10-CM | POA: Diagnosis present

## 2024-06-25 DIAGNOSIS — D696 Thrombocytopenia, unspecified: Secondary | ICD-10-CM | POA: Diagnosis present

## 2024-06-25 DIAGNOSIS — K651 Peritoneal abscess: Secondary | ICD-10-CM | POA: Diagnosis present

## 2024-06-25 DIAGNOSIS — Z818 Family history of other mental and behavioral disorders: Secondary | ICD-10-CM

## 2024-06-25 DIAGNOSIS — F4310 Post-traumatic stress disorder, unspecified: Secondary | ICD-10-CM | POA: Diagnosis present

## 2024-06-25 DIAGNOSIS — L299 Pruritus, unspecified: Secondary | ICD-10-CM | POA: Diagnosis present

## 2024-06-25 DIAGNOSIS — A419 Sepsis, unspecified organism: Principal | ICD-10-CM

## 2024-06-25 DIAGNOSIS — B9620 Unspecified Escherichia coli [E. coli] as the cause of diseases classified elsewhere: Secondary | ICD-10-CM | POA: Diagnosis not present

## 2024-06-25 LAB — CBC WITH DIFFERENTIAL/PLATELET
Basophils Absolute: 0.6 K/uL — ABNORMAL HIGH (ref 0.0–0.1)
Basophils Relative: 2 %
Eosinophils Absolute: 0.6 K/uL — ABNORMAL HIGH (ref 0.0–0.5)
Eosinophils Relative: 2 %
HCT: 26.9 % — ABNORMAL LOW (ref 39.0–52.0)
Hemoglobin: 9.4 g/dL — ABNORMAL LOW (ref 13.0–17.0)
Lymphocytes Relative: 19 %
Lymphs Abs: 5.4 K/uL — ABNORMAL HIGH (ref 0.7–4.0)
MCH: 35.5 pg — ABNORMAL HIGH (ref 26.0–34.0)
MCHC: 34.9 g/dL (ref 30.0–36.0)
MCV: 101.5 fL — ABNORMAL HIGH (ref 80.0–100.0)
Monocytes Absolute: 0 K/uL — ABNORMAL LOW (ref 0.1–1.0)
Monocytes Relative: 0 %
Neutro Abs: 21.9 K/uL — ABNORMAL HIGH (ref 1.7–7.7)
Neutrophils Relative %: 77 %
Platelets: 140 K/uL — ABNORMAL LOW (ref 150–400)
RBC: 2.65 MIL/uL — ABNORMAL LOW (ref 4.22–5.81)
RDW: 21.2 % — ABNORMAL HIGH (ref 11.5–15.5)
Smear Review: NORMAL
WBC: 28.5 K/uL — ABNORMAL HIGH (ref 4.0–10.5)
nRBC: 0 % (ref 0.0–0.2)

## 2024-06-25 LAB — COMPREHENSIVE METABOLIC PANEL WITH GFR
AST: 50 U/L — ABNORMAL HIGH (ref 15–41)
Albumin: 3.4 g/dL — ABNORMAL LOW (ref 3.5–5.0)
Alkaline Phosphatase: 143 U/L — ABNORMAL HIGH (ref 38–126)
Anion gap: 10 (ref 5–15)
BUN: 13 mg/dL (ref 6–20)
CO2: 23 mmol/L (ref 22–32)
Calcium: 8.9 mg/dL (ref 8.9–10.3)
Chloride: 96 mmol/L — ABNORMAL LOW (ref 98–111)
Creatinine, Ser: 0.69 mg/dL (ref 0.61–1.24)
GFR, Estimated: >60 mL/min
Glucose, Bld: 155 mg/dL — ABNORMAL HIGH (ref 70–99)
Potassium: 4.9 mmol/L (ref 3.5–5.1)
Sodium: 129 mmol/L — ABNORMAL LOW (ref 135–145)
Total Bilirubin: 21.1 mg/dL (ref 0.0–1.2)
Total Protein: 7 g/dL (ref 6.5–8.1)

## 2024-06-25 LAB — AMMONIA: Ammonia: 23 umol/L (ref 9–35)

## 2024-06-25 LAB — PROTIME-INR
INR: 2.8 — ABNORMAL HIGH (ref 0.8–1.2)
Prothrombin Time: 31.2 s — ABNORMAL HIGH (ref 11.4–15.2)

## 2024-06-25 LAB — LIPASE, BLOOD: Lipase: 107 U/L — ABNORMAL HIGH (ref 11–51)

## 2024-06-25 LAB — RESP PANEL BY RT-PCR (RSV, FLU A&B, COVID)  RVPGX2
Influenza A by PCR: NEGATIVE
Influenza B by PCR: NEGATIVE
Resp Syncytial Virus by PCR: NEGATIVE
SARS Coronavirus 2 by RT PCR: NEGATIVE

## 2024-06-25 LAB — ETHANOL: Alcohol, Ethyl (B): <15 mg/dL

## 2024-06-25 LAB — I-STAT CG4 LACTIC ACID, ED: Lactic Acid, Venous: 1.2 mmol/L (ref 0.5–1.9)

## 2024-06-25 MED ORDER — VANCOMYCIN HCL IN DEXTROSE 1-5 GM/200ML-% IV SOLN: 1000.0000 mg | Freq: Two times a day (BID) | INTRAVENOUS | Status: DC

## 2024-06-25 MED ORDER — POLYETHYLENE GLYCOL 3350 17 G PO PACK: 17.0000 g | PACK | Freq: Every day | ORAL | Status: DC | PRN

## 2024-06-25 MED ORDER — VANCOMYCIN HCL 1250 MG/250ML IV SOLN
1250.0000 mg | Freq: Once | INTRAVENOUS | Status: AC
  Administered 2024-06-25: 1250 mg via INTRAVENOUS
  Filled 2024-06-25: qty 250

## 2024-06-25 MED ORDER — MELATONIN 5 MG PO TABS
5.0000 mg | ORAL_TABLET | Freq: Every evening | ORAL | Status: DC | PRN
  Administered 2024-06-26 – 2024-06-27 (×2): 5 mg via ORAL
  Filled 2024-06-25 (×2): qty 1

## 2024-06-25 MED ORDER — ADULT MULTIVITAMIN W/MINERALS CH
1.0000 | ORAL_TABLET | Freq: Every day | ORAL | Status: DC
  Administered 2024-06-26 – 2024-07-03 (×8): 1 via ORAL
  Filled 2024-06-25 (×8): qty 1

## 2024-06-25 MED ORDER — PIPERACILLIN-TAZOBACTAM 3.375 G IVPB 30 MIN
3.3750 g | Freq: Once | INTRAVENOUS | Status: AC
  Administered 2024-06-25: 3.375 g via INTRAVENOUS
  Filled 2024-06-25: qty 50

## 2024-06-25 MED ORDER — PROCHLORPERAZINE EDISYLATE 10 MG/2ML IJ SOLN
5.0000 mg | Freq: Four times a day (QID) | INTRAMUSCULAR | Status: DC | PRN
  Administered 2024-06-29: 5 mg via INTRAVENOUS
  Filled 2024-06-25 (×2): qty 2

## 2024-06-25 MED ORDER — DIPHENHYDRAMINE HCL 50 MG/ML IJ SOLN
25.0000 mg | Freq: Once | INTRAMUSCULAR | Status: AC
  Administered 2024-06-25: 25 mg via INTRAVENOUS

## 2024-06-25 MED ORDER — LACTULOSE 10 GM/15ML PO SOLN
20.0000 g | Freq: Two times a day (BID) | ORAL | Status: DC
  Administered 2024-06-26 – 2024-07-03 (×8): 20 g via ORAL
  Filled 2024-06-25 (×14): qty 30

## 2024-06-25 MED ORDER — FOLIC ACID 1 MG PO TABS
1.0000 mg | ORAL_TABLET | Freq: Every day | ORAL | Status: DC
  Administered 2024-06-26 – 2024-07-03 (×8): 1 mg via ORAL
  Filled 2024-06-25 (×8): qty 1

## 2024-06-25 MED ORDER — SODIUM CHLORIDE 0.9 % IV BOLUS
1000.0000 mL | Freq: Once | INTRAVENOUS | Status: AC
  Administered 2024-06-25: 1000 mL via INTRAVENOUS

## 2024-06-25 MED ORDER — POTASSIUM CHLORIDE CRYS ER 20 MEQ PO TBCR
20.0000 meq | EXTENDED_RELEASE_TABLET | Freq: Every day | ORAL | Status: DC
  Administered 2024-06-26 – 2024-07-03 (×8): 20 meq via ORAL
  Filled 2024-06-25 (×8): qty 1

## 2024-06-25 MED ORDER — DIPHENHYDRAMINE HCL 50 MG/ML IJ SOLN: 25.0000 mg | INTRAMUSCULAR | Status: DC

## 2024-06-25 MED ORDER — THIAMINE MONONITRATE 100 MG PO TABS
100.0000 mg | ORAL_TABLET | Freq: Every day | ORAL | Status: DC
  Administered 2024-06-26 – 2024-07-03 (×8): 100 mg via ORAL
  Filled 2024-06-25 (×8): qty 1

## 2024-06-25 MED ORDER — FUROSEMIDE 40 MG PO TABS
40.0000 mg | ORAL_TABLET | Freq: Every day | ORAL | Status: AC
  Administered 2024-06-26 – 2024-06-29 (×4): 40 mg via ORAL
  Filled 2024-06-25 (×4): qty 1

## 2024-06-25 MED ORDER — SPIRONOLACTONE 100 MG PO TABS
100.0000 mg | ORAL_TABLET | Freq: Every day | ORAL | Status: DC
  Administered 2024-06-26 – 2024-06-29 (×4): 100 mg via ORAL
  Filled 2024-06-25 (×4): qty 1

## 2024-06-25 MED ORDER — IOHEXOL 300 MG/ML  SOLN
100.0000 mL | Freq: Once | INTRAMUSCULAR | Status: AC | PRN
  Administered 2024-06-25: 100 mL via INTRAVENOUS

## 2024-06-25 MED ORDER — DIPHENHYDRAMINE HCL 50 MG/ML IJ SOLN
INTRAMUSCULAR | Status: AC
  Filled 2024-06-25: qty 1

## 2024-06-25 MED ORDER — RIFAXIMIN 550 MG PO TABS
550.0000 mg | ORAL_TABLET | Freq: Two times a day (BID) | ORAL | Status: DC
  Administered 2024-06-26 – 2024-07-03 (×13): 550 mg via ORAL
  Filled 2024-06-25 (×15): qty 1

## 2024-06-25 MED ORDER — PANTOPRAZOLE SODIUM 40 MG PO TBEC
40.0000 mg | DELAYED_RELEASE_TABLET | Freq: Every day | ORAL | Status: DC
  Administered 2024-06-26 – 2024-07-02 (×8): 40 mg via ORAL
  Filled 2024-06-25 (×8): qty 1

## 2024-06-25 NOTE — ED Triage Notes (Addendum)
 Return for concern of abnormal labs. Says he was recently admitted for complications of liver disease.   Jaundice skin is his baseline he reports.   PCP notified him of WBC -35,000 and was encouraged to ED for evaluation.

## 2024-06-25 NOTE — Progress Notes (Signed)
 Pharmacy Antibiotic Note  Jeffrey Mathis is a 44 y.o. male admitted on 06/25/2024 with history of alcohol use disorder, pancreatitis, and cirrhosis who presents with concern for leukocytosis .  Pharmacy has been consulted for vancomycin  dosing.  Plan: Vancomycin  1250mg  IV x 1 then 1gm q12h (AUC 473.6, Scr 0.8, TBW) Follow renal function and clinical course  Height: 6' 1 (185.4 cm) Weight: 62.6 kg (138 lb) IBW/kg (Calculated) : 79.9  Temp (24hrs), Avg:99 F (37.2 C), Min:99 F (37.2 C), Max:99 F (37.2 C)  Recent Labs  Lab 06/19/24 0431 06/25/24 1959 06/25/24 2306  WBC 9.8 28.5*  --   CREATININE 0.91 0.69  --   LATICACIDVEN  --   --  1.2    Estimated Creatinine Clearance: 107.6 mL/min (by C-G formula based on SCr of 0.69 mg/dL).    Allergies[1]  Antimicrobials this admission: 1/22 vanc >>   Dose adjustments this admission:   Microbiology results: 1/22 BCx:   Thank you for allowing pharmacy to be a part of this patients care. Leeroy Mace RPh 06/25/2024, 11:26 PM     [1] No Known Allergies

## 2024-06-25 NOTE — Sepsis Progress Note (Signed)
 Elink monitoring for the code sepsis protocol.

## 2024-06-25 NOTE — H&P (Addendum)
 " History and Physical  Jeffrey Mathis FMW:[id number] DOB: November 14, 1980 DOA: 06/25/2024  Referring physician: Dr. Cleotilde, Resident- EDP  PCP: [facility]  [facility]. Patient coming from: Home  Chief Complaint: Abnormal lab results.  HPI: Jeffrey Mathis is a 44 y.o. male with medical history significant for liver cirrhosis, GERD, history of alcohol abuse (none since December 2018), recurrent pancreatitis, who presents to the ER due to abnormal lab results.  The patient went to his primary care provider at the [facility] for a post hospital follow up appointment.  The patient was recently admitted from 06/14/2024 through 06/19/2024 for large left pleural effusion, hepatic hydrothorax/hemothorax with acute hypoxic respiratory failure, and E. coli bacteremia.  Outpatient blood work returned today with leukocytosis 34,000.  The patient was advised to go to the ER for further evaluation.  He presented to the ER at [facility] accompanied by his wife.  In the ER, jaundice, denies having any abdominal pain.  No nausea or vomiting.  No subjective fevers or chills.  Repeat CBC with differentials revealed WBC of 28.5 K with neutrophilia.  Hemoglobin 9.4 with MCV of 101, platelets 140, lipase 107, alkaline phosphatase 143, AST 50, T bilirubin 21.1.  Lactic acid 1.2.  Serum sodium 129, glucose 155.  CT abdomen pelvis with contrast revealed moderate left-sided pleural effusion, decreased compared to prior, resolved right pleural effusion.  Improved aeration of the left upper lobe.  Liver cirrhosis with evidence of portal hypertension.  Large gastroesophageal varices and splenomegaly.  Decreased small volume ascites compared to prior.  Interim finding of long enhancing fluid collection along the right anterior pararenal space and the right colic gutter this is suspicious for developing abscess.    Distended gallbladder with wall thickening and trace  pericholecystic fluid.  Findings are nonspecific could be secondary to liver disease however if there is concern for acute cholecystitis correlation with ultrasound is recommended.  Mild ascending colon wall thickening decreased compared to the prior CT now with large amount of stool present throughout the colon.   ED Course: Temperature 99.  BP 160/65, pulse 100, respiration rate 18, O2 saturation 96% on room air.  Review of Systems: Review of systems as noted in the HPI. All other systems reviewed and are negative.   Past Medical History:  Diagnosis Date   Pancreatitis    PTSD (post-traumatic stress disorder)    PTSD (post-traumatic stress disorder)    Past Surgical History:  Procedure Laterality Date   ABDOMINAL SURGERY     scrapnel   APPENDECTOMY     arm surgery     KNEE ARTHROSCOPY     R forearm surgery      Social History:  reports that he has been smoking cigarettes. He has never used smokeless tobacco. He reports current alcohol use of about 6.0 standard drinks of alcohol per week. He reports current drug use.   Allergies[1]  Family History  Problem Relation Age of Onset   Cancer Mother    Bipolar disorder Mother    Cancer Father    Pancreatitis Father       Prior to Admission medications  Medication Sig Start Date End Date Taking? Authorizing Provider  folic acid  (FOLVITE ) 1 MG tablet Take 1 tablet (1 mg total) by mouth daily. 06/20/24 07/20/24  Arlice Reichert, MD  furosemide  (LASIX ) 40 MG tablet Take 1 tablet (40 mg total) by mouth daily. 06/19/24 09/17/24  Arlice Reichert, MD  lactulose  (CHRONULAC ) 10 GM/15ML  solution Take 30 mLs (20 g total) by mouth 2 (two) times daily. 06/19/24 07/19/24  Arlice Reichert, MD  Multiple Vitamin (MULTIVITAMIN WITH MINERALS) TABS tablet Take 1 tablet by mouth daily. 06/20/24 07/20/24  Arlice Reichert, MD  pantoprazole  (PROTONIX ) 40 MG tablet Take 1 tablet (40 mg total) by mouth at bedtime. 06/19/24 07/19/24  Arlice Reichert, MD  potassium chloride   SA (KLOR-CON  M) 20 MEQ tablet Take 1 tablet (20 mEq total) by mouth daily. 06/20/24 07/20/24  Arlice Reichert, MD  rifaximin  (XIFAXAN ) 550 MG TABS tablet Take 1 tablet (550 mg total) by mouth 2 (two) times daily. 06/19/24 07/19/24  Arlice Reichert, MD  spironolactone  (ALDACTONE ) 100 MG tablet Take 1 tablet (100 mg total) by mouth daily. 06/20/24 07/20/24  Arlice Reichert, MD  thiamine  (VITAMIN B-1) 100 MG tablet Take 1 tablet (100 mg total) by mouth daily. 06/20/24 07/20/24  Arlice Reichert, MD    Physical Exam: BP 113/70   Pulse 99   Temp 99 F (37.2 C) (Oral)   Resp 16   Ht 6' 1 (1.854 m)   Wt 62.6 kg   SpO2 96%   BMI 18.21 kg/m   General: 44 y.o. year-old male well developed well nourished in no acute distress.  Alert and oriented x3. Cardiovascular: Regular rate and rhythm with no rubs or gallops.  No thyromegaly or JVD noted.  No lower extremity edema. 2/4 pulses in all 4 extremities. Respiratory: Clear to auscultation with no wheezes or rales. Good inspiratory effort. Abdomen: Soft nontender mildly distended.  With normal bowel sounds x4 quadrants. Muskuloskeletal: No cyanosis, clubbing or edema noted bilaterally Neuro: CN II-XII intact, strength, sensation, reflexes Skin: No ulcerative lesions noted or rashes.  Jaundice. Psychiatry: Judgement and insight appear normal. Mood is appropriate for condition and setting          Labs on Admission:  Basic Metabolic Panel: Recent Labs  Lab 06/19/24 0431 06/25/24 1959  NA 134* 129*  K 3.4* 4.9  CL 95* 96*  CO2 26 23  GLUCOSE 132* 155*  BUN 14 13  CREATININE 0.91 0.69  CALCIUM 8.7* 8.9   Liver Function Tests: Recent Labs  Lab 06/25/24 1959  AST 50*  ALT RESULTS UNAVAILABLE DUE TO INTERFERING SUBSTANCE  ALKPHOS 143*  BILITOT 21.1*  PROT 7.0  ALBUMIN  3.4*   Recent Labs  Lab 06/25/24 2042  LIPASE 107*   Recent Labs  Lab 06/25/24 2049  AMMONIA 23   CBC: Recent Labs  Lab 06/19/24 0431 06/25/24 1959  WBC 9.8 28.5*   NEUTROABS 4.8 21.9*  HGB 8.9* 9.4*  HCT 24.7* 26.9*  MCV 94.3 101.5*  PLT 64* 140*   Cardiac Enzymes: No results for input(s): CKTOTAL, CKMB, CKMBINDEX, TROPONINI in the last 168 hours.  BNP (last 3 results) No results for input(s): BNP in the last 8760 hours.  ProBNP (last 3 results) No results for input(s): PROBNP in the last 8760 hours.  CBG: No results for input(s): GLUCAP in the last 168 hours.  Radiological Exams on Admission: CT CHEST ABDOMEN PELVIS W CONTRAST Result Date: 06/25/2024 CLINICAL DATA:  Abnormal labs elevated white count jaundice EXAM: CT CHEST, ABDOMEN, AND PELVIS WITH CONTRAST TECHNIQUE: Multidetector CT imaging of the chest, abdomen and pelvis was performed following the standard protocol during bolus administration of intravenous contrast. RADIATION DOSE REDUCTION: This exam was performed according to the departmental dose-optimization program which includes automated exposure control, adjustment of the mA and/or kV according to patient size and/or use of iterative reconstruction technique.  CONTRAST:  OMNIPAQUE  IOHEXOL  300 MG/ML  SOLN COMPARISON:  Chest x-ray 06/25/2024, CT 06/14/2024, MRI 11/04/2021 FINDINGS: CT CHEST FINDINGS Cardiovascular: Nonaneurysmal aorta. Normal cardiac size. No pericardial effusion. Mediastinum/Nodes: Patent trachea. No thyroid mass. No suspicious lymph nodes. Lungs/Pleura: Moderate left-sided pleural effusion, decreased compared to prior. Resolved right pleural effusion. Improved aeration of left upper lobe. Probable passive atelectasis in the left lower lobe but pneumonia not excluded. Musculoskeletal: Sternum appears intact. No acute osseous abnormality. CT ABDOMEN PELVIS FINDINGS Hepatobiliary: Liver cirrhosis. Distended gallbladder with wall thickening. Minimal mural calcification as before. Trace pericholecystic fluid. No biliary dilatation Pancreas: Unremarkable. No pancreatic ductal dilatation or surrounding  inflammatory changes. Spleen: Enlarged, measures 16 cm craniocaudal. Adrenals/Urinary Tract: Adrenal glands are normal. Kidneys show no hydronephrosis. The bladder is normal Stomach/Bowel: Stomach unremarkable. No dilated small bowel. Mild ascending colon wall thickening or but decreased compared to the prior CT, now large amount of stool present throughout the colon. Vascular/Lymphatic: Mild atherosclerosis. No aneurysm. No suspicious lymph nodes. Numerous gastroesophageal varices. Reproductive: Negative prostate Other: No free air. Decreased small volume ascites compared to prior. Suspicion of thin rim enhancing fluid collection within the right gutter with surrounding soft tissue stranding, this measures 3 point 8 cm x 1.2 cm by 7.5 cm craniocaudal. Musculoskeletal: No acute osseous abnormality. IMPRESSION: 1. Moderate left-sided pleural effusion, decreased compared to prior. Resolved right pleural effusion. Improved aeration of left upper lobe. Probable passive atelectasis in the left lower lobe but pneumonia not excluded. 2. Liver cirrhosis with evidence of portal hypertension. Large gastroesophageal varices and splenomegaly. Decreased small volume ascites compared to prior. 3. Interim finding of long thin rim enhancing fluid collection along the right anterior pararenal space and right colic gutter, this is suspicious for a developing abscess. 4. Distended gallbladder with wall thickening and trace pericholecystic fluid. Findings are nonspecific and could be secondary to liver disease, however if there is concern for acute cholecystitis, correlation with ultrasound is recommended. 5. Mild ascending colon wall thickening, decreased compared to the prior CT, now with large amount of stool present throughout the colon. Electronically Signed   By: Luke Bun M.D.   On: 06/25/2024 22:32   DG Chest Port 1 View Result Date: 06/25/2024 EXAM: 1 VIEW(S) XRAY OF THE CHEST 06/25/2024 08:49:00 PM COMPARISON:  06/16/2024 CLINICAL HISTORY: Shortness of breath. FINDINGS: LUNGS AND PLEURA: Moderate left pleural effusion, mildly decreased from prior exam. Left lung base opacity, likely atelectasis. No pneumothorax. HEART AND MEDIASTINUM: No acute abnormality of the cardiac and mediastinal silhouettes. BONES AND SOFT TISSUES: No acute osseous abnormality. IMPRESSION: 1. Moderate left pleural effusion, mildly decreased from prior. Electronically signed by: Pinkie Pebbles MD 06/25/2024 09:07 PM EST RP Workstation: [id number]    EKG: I independently viewed the EKG done and my findings are as followed: None available at the time of this visit.  Assessment/Plan Present on Admission:  Intra-abdominal abscess (HCC)  Principal Problem:   Intra-abdominal abscess (HCC)  Intra-abdominal abscess, seen on CT scan, POA Received IV Zosyn  and IV vancomycin  in the ER. Could not tolerate IV vancomycin  due to pruritus, infusion rate lowered, IV Benadryl  given, switched to IV Linezolid . Monitor peripheral blood cultures x 2 Monitor CRP, lactic acid, fever curve and WBCs. Consider interventional radiology consultation in the morning.  Liver cirrhosis with mild ascites Portal hypertension with large gastroesophageal varices and splenomegaly Denies having any abdominal pain. No prior history of paracentesis. Monitor LFTs. Close follow-up with GI.  Jaundice, coagulopathy T. bili 21 PT 31.2, INR 2.8.  Continue to monitor.  End-stage liver disease Appointment at [facility] in February 2026 to be put on liver transplant list MELD score of 30, significant risk of 34-month mortality without transplant, advanced liver disease. Resume home regimen. Continue to avoid hepatotoxic agents.  Chronic macrocytic anemia in the setting of the above Hemoglobin is stable at 9.6.  Continue to monitor H&H.  Chronic thrombocytopenia in the setting of end-stage liver disease Platelets stable at 140, continue to monitor. No overt  bleeding reported.  GERD Resume home PPI.  Moderate left pleural effusion, decrease compared to prior Currently not requiring O2 supplementation Early mobilization at home diuretics   Time: 75 minutes.   DVT prophylaxis: SCDs.  Code Status: Full code.  Family Communication: The patient's spouse at bedside.  Disposition Plan: Admitted to telemetry unit.  Consults called: None.  Admission status: Inpatient status.   Status is: Inpatient The patient requires at least 2 midnights for further evaluation and treatment of present condition.   Terry LOISE Hurst MD [facility] Pager [phone]  If 7PM-7AM, please contact night-coverage [email or web address] Password [facility]  06/25/2024, 11:15 PM      [1] No Known Allergies  "

## 2024-06-25 NOTE — ED Provider Notes (Signed)
 "  EMERGENCY DEPARTMENT AT [facility] Provider Note   CSN: [id number] Arrival date & time: 06/25/24  8084     Patient presents with: Abnormal labs   Burwell Bethel is a 44 y.o. male.   Bora Broner is a 44 year old with a history of alcohol use disorder, pancreatitis, and cirrhosis who presents with concern for leukocytosis found outpatient by his PCP.  His PCP notified him tonight of a WBC of 35,000. He feels generally well without fever or chills. He notes mild abdominal tenderness since discharge. Breathing feels stable.  He was hospitalized January 11 to 16 for shortness of breath, jaundice, and 30-pound weight loss. He was found to have E. coli bacteremia and a large left pleural effusion treated with thoracentesis. He was discharged on furosemide  40 mg and spironolactone  100 mg which has improved his ascites.  He completed the course of cefdinir  and metronidazole  as prescribed at discharge. He recently picked up a new antibiotic cefadroxil  that he has not yet started from the [place].        Prior to Admission medications  Medication Sig Start Date End Date Taking? Authorizing Provider  folic acid  (FOLVITE ) 1 MG tablet Take 1 tablet (1 mg total) by mouth daily. 06/20/24 07/20/24  Arlice Reichert, MD  furosemide  (LASIX ) 40 MG tablet Take 1 tablet (40 mg total) by mouth daily. 06/19/24 09/17/24  Arlice Reichert, MD  lactulose  (CHRONULAC ) 10 GM/15ML solution Take 30 mLs (20 g total) by mouth 2 (two) times daily. 06/19/24 07/19/24  Arlice Reichert, MD  Multiple Vitamin (MULTIVITAMIN WITH MINERALS) TABS tablet Take 1 tablet by mouth daily. 06/20/24 07/20/24  Arlice Reichert, MD  pantoprazole  (PROTONIX ) 40 MG tablet Take 1 tablet (40 mg total) by mouth at bedtime. 06/19/24 07/19/24  Arlice Reichert, MD  potassium chloride  SA (KLOR-CON  M) 20 MEQ tablet Take 1 tablet (20 mEq total) by mouth daily. 06/20/24 07/20/24  Arlice Reichert, MD  rifaximin  (XIFAXAN ) 550 MG TABS tablet Take 1 tablet  (550 mg total) by mouth 2 (two) times daily. 06/19/24 07/19/24  Arlice Reichert, MD  spironolactone  (ALDACTONE ) 100 MG tablet Take 1 tablet (100 mg total) by mouth daily. 06/20/24 07/20/24  Arlice Reichert, MD  thiamine  (VITAMIN B-1) 100 MG tablet Take 1 tablet (100 mg total) by mouth daily. 06/20/24 07/20/24  Arlice Reichert, MD    Allergies: Patient has no known allergies.    Review of Systems  Constitutional:  Negative for activity change, appetite change, chills, diaphoresis, fatigue and fever.  Gastrointestinal:  Negative for abdominal distention, abdominal pain, diarrhea, rectal pain and vomiting.    Updated Vital Signs BP 113/70   Pulse 99   Temp 99 F (37.2 C) (Oral)   Resp 16   Ht 6' 1 (1.854 m)   Wt 62.6 kg   SpO2 96%   BMI 18.21 kg/m   Physical Exam Constitutional:      Appearance: He is normal weight. He is ill-appearing.  Cardiovascular:     Rate and Rhythm: Normal rate and regular rhythm.     Pulses: Normal pulses.     Heart sounds: Murmur heard.  Pulmonary:     Effort: Pulmonary effort is normal. No respiratory distress.     Comments: Diminished breath sounds on the right  Abdominal:     General: Abdomen is flat. There is no distension.     Tenderness: There is no abdominal tenderness. There is no guarding.  Skin:    Capillary Refill: Capillary refill takes  less than 2 seconds.     Coloration: Skin is jaundiced.  Neurological:     General: No focal deficit present.     Mental Status: He is alert and oriented to person, place, and time. Mental status is at baseline.     Cranial Nerves: No cranial nerve deficit.     Motor: No weakness.     Gait: Gait normal.  Psychiatric:        Mood and Affect: Mood normal.        Thought Content: Thought content normal.        Judgment: Judgment normal.     (all labs ordered are listed, but only abnormal results are displayed) Labs Reviewed  CBC WITH DIFFERENTIAL/PLATELET - Abnormal; Notable for the following components:       Result Value   WBC 28.5 (*)    RBC 2.65 (*)    Hemoglobin 9.4 (*)    HCT 26.9 (*)    MCV 101.5 (*)    MCH 35.5 (*)    RDW 21.2 (*)    Platelets 140 (*)    Neutro Abs 21.9 (*)    Lymphs Abs 5.4 (*)    Monocytes Absolute 0.0 (*)    Eosinophils Absolute 0.6 (*)    Basophils Absolute 0.6 (*)    All other components within normal limits  COMPREHENSIVE METABOLIC PANEL WITH GFR - Abnormal; Notable for the following components:   Sodium 129 (*)    Chloride 96 (*)    Glucose, Bld 155 (*)    Albumin  3.4 (*)    AST 50 (*)    Alkaline Phosphatase 143 (*)    Total Bilirubin 21.1 (*)    All other components within normal limits  PROTIME-INR - Abnormal; Notable for the following components:   Prothrombin Time 31.2 (*)    INR 2.8 (*)    All other components within normal limits  LIPASE, BLOOD - Abnormal; Notable for the following components:   Lipase 107 (*)    All other components within normal limits  RESP PANEL BY RT-PCR (RSV, FLU A&B, COVID)  RVPGX2  CULTURE, BLOOD (ROUTINE X 2)  CULTURE, BLOOD (ROUTINE X 2)  ETHANOL  AMMONIA  I-STAT CG4 LACTIC ACID, ED    EKG: None  Radiology: CT CHEST ABDOMEN PELVIS W CONTRAST Result Date: 06/25/2024 CLINICAL DATA:  Abnormal labs elevated white count jaundice EXAM: CT CHEST, ABDOMEN, AND PELVIS WITH CONTRAST TECHNIQUE: Multidetector CT imaging of the chest, abdomen and pelvis was performed following the standard protocol during bolus administration of intravenous contrast. RADIATION DOSE REDUCTION: This exam was performed according to the departmental dose-optimization program which includes automated exposure control, adjustment of the mA and/or kV according to patient size and/or use of iterative reconstruction technique. CONTRAST:  OMNIPAQUE  IOHEXOL  300 MG/ML  SOLN COMPARISON:  Chest x-ray 06/25/2024, CT 06/14/2024, MRI 11/04/2021 FINDINGS: CT CHEST FINDINGS Cardiovascular: Nonaneurysmal aorta. Normal cardiac size. No pericardial  effusion. Mediastinum/Nodes: Patent trachea. No thyroid mass. No suspicious lymph nodes. Lungs/Pleura: Moderate left-sided pleural effusion, decreased compared to prior. Resolved right pleural effusion. Improved aeration of left upper lobe. Probable passive atelectasis in the left lower lobe but pneumonia not excluded. Musculoskeletal: Sternum appears intact. No acute osseous abnormality. CT ABDOMEN PELVIS FINDINGS Hepatobiliary: Liver cirrhosis. Distended gallbladder with wall thickening. Minimal mural calcification as before. Trace pericholecystic fluid. No biliary dilatation Pancreas: Unremarkable. No pancreatic ductal dilatation or surrounding inflammatory changes. Spleen: Enlarged, measures 16 cm craniocaudal. Adrenals/Urinary Tract: Adrenal glands are normal. Kidneys  show no hydronephrosis. The bladder is normal Stomach/Bowel: Stomach unremarkable. No dilated small bowel. Mild ascending colon wall thickening or but decreased compared to the prior CT, now large amount of stool present throughout the colon. Vascular/Lymphatic: Mild atherosclerosis. No aneurysm. No suspicious lymph nodes. Numerous gastroesophageal varices. Reproductive: Negative prostate Other: No free air. Decreased small volume ascites compared to prior. Suspicion of thin rim enhancing fluid collection within the right gutter with surrounding soft tissue stranding, this measures 3 point 8 cm x 1.2 cm by 7.5 cm craniocaudal. Musculoskeletal: No acute osseous abnormality. IMPRESSION: 1. Moderate left-sided pleural effusion, decreased compared to prior. Resolved right pleural effusion. Improved aeration of left upper lobe. Probable passive atelectasis in the left lower lobe but pneumonia not excluded. 2. Liver cirrhosis with evidence of portal hypertension. Large gastroesophageal varices and splenomegaly. Decreased small volume ascites compared to prior. 3. Interim finding of long thin rim enhancing fluid collection along the right anterior  pararenal space and right colic gutter, this is suspicious for a developing abscess. 4. Distended gallbladder with wall thickening and trace pericholecystic fluid. Findings are nonspecific and could be secondary to liver disease, however if there is concern for acute cholecystitis, correlation with ultrasound is recommended. 5. Mild ascending colon wall thickening, decreased compared to the prior CT, now with large amount of stool present throughout the colon. Electronically Signed   By: Luke Bun M.D.   On: 06/25/2024 22:32   DG Chest Port 1 View Result Date: 06/25/2024 EXAM: 1 VIEW(S) XRAY OF THE CHEST 06/25/2024 08:49:00 PM COMPARISON: 06/16/2024 CLINICAL HISTORY: Shortness of breath. FINDINGS: LUNGS AND PLEURA: Moderate left pleural effusion, mildly decreased from prior exam. Left lung base opacity, likely atelectasis. No pneumothorax. HEART AND MEDIASTINUM: No acute abnormality of the cardiac and mediastinal silhouettes. BONES AND SOFT TISSUES: No acute osseous abnormality. IMPRESSION: 1. Moderate left pleural effusion, mildly decreased from prior. Electronically signed by: Pinkie Pebbles MD 06/25/2024 09:07 PM EST RP Workstation: [id number]     Procedures   Medications Ordered in the ED  vancomycin  (VANCOREADY) IVPB 1250 mg/250 mL (has no administration in time range)  sodium chloride  0.9 % bolus 1,000 mL (has no administration in time range)  piperacillin -tazobactam (ZOSYN ) IVPB 3.375 g (0 g Intravenous Stopped 06/25/24 2130)  iohexol  (OMNIPAQUE ) 300 MG/ML solution 100 mL (100 mLs Intravenous Contrast Given 06/25/24 2202)    Clinical Course as of 06/25/24 2250  Thu Jun 25, 2024  2247 CT concerning for possible developing pararenal abscess, also possible PNA. He has 2 SIRS, will activate code sepsis. Blood ctx already sent. Add vancomycin . May benefit from IR eval for ?drainage.   CT also w/ cirrhosis, improving ascites, SBP seems unlikely at this time given lack of abd pain, no  fever.  CT w/ esophageal varices, no evidence of bleeding today fortunately. Hgb improved.  Abnormality noted to GB on imaging, he has no RUQ pain, doubt acute cholecystitis  Plan admission  [SG]    Clinical Course User Index [SG] Elnor Jayson LABOR, DO                                 Medical Decision Making Rochelle Nephew is a 44 y.o. male presenting for leukocytosis from his PCP's office. He was recently admitted for pleural effusion s/p thoracentesis and sepsis. He completed outpatient antibiotics as prescribed and was feeling at baseline at home. On exam he is cachectic and ill-appearing in no acute distress.  Labs significant for elevated bilirubin INR 2.8 with a MELD-Na score of 32. CT chest, AP showed moderate left side pleural effusion decreased from prior, possible developing abscess in the right colic gutter, and gallbladder distention with pericolic fluid. Do not suspect acute cholecystitis due to benign abdominal exam.   Due to leukocytosis in the setting of potential developing abscess, will admit for IV antibiotics and lab monitoring.    Amount and/or Complexity of Data Reviewed Labs: ordered. Radiology: ordered.  Risk Prescription drug management. Decision regarding hospitalization.       Final diagnoses:  Hyperbilirubinemia  Leukocytosis, unspecified type  Pleural effusion  Alcoholic cirrhosis of liver with ascites (HCC)  Community acquired pneumonia, unspecified laterality  Esophageal varices without bleeding, unspecified esophageal varices type [facility])    ED Discharge Orders     None          Cleotilde Perkins, DO 06/25/24 2250    Elnor Jayson LABOR, DO 06/27/24 1541  "

## 2024-06-25 NOTE — H&P (Incomplete)
 " History and Physical  Jeffrey Mathis FMW:[id number] DOB: December 12, 1980 DOA: 06/25/2024  Referring physician: Dr. Cleotilde, Resident- EDP  PCP: [facility]  [facility]. Patient coming from: Home  Chief Complaint: Abnormal lab results.  HPI: Jeffrey Mathis is a 44 y.o. male with medical history significant for liver cirrhosis, GERD, history of alcohol abuse (none since December 2018), recurrent pancreatitis, who presents to the ER due to abnormal lab results.  The patient went to his primary care provider at the [facility] for a post hospital follow up appointment.  The patient was recently admitted from 06/14/2024 through 06/19/2024 for large left pleural effusion, hepatic hydrothorax/hemothorax with acute hypoxic respiratory failure, and E. coli bacteremia.  Lab work returned today with leukocytosis 34,000.  The patient was advised to go to the ER for further evaluation.  He presented to the ER at [facility] accompanied by his wife.  In the ER, jaundice, denies having any abdominal pain.  No nausea or vomiting.  No subjective fevers or chills.  Repeat CBC with differentials revealed WBC of 28.5 K with neutrophilia.  Hemoglobin 9.4 with MCV of 101, platelets 140, lipase 107, alkaline phosphatase 143, AST 50, T bilirubin 21.1.  Lactic acid 1.2.  Serum sodium 129, glucose 155.  CT abdomen pelvis with contrast revealed moderate left-sided pleural effusion, decreased compared to prior, resolved right pleural effusion.  Improved aeration of the left upper lobe.  Liver cirrhosis with evidence of portal hypertension.  Large gastroesophageal varices and splenomegaly.  Decreased small volume ascites compared to prior.  Interim finding of long enhancing fluid collection along the right anterior pararenal space and the right colic gutter this is suspicious for developing abscess.    Distended gallbladder with wall thickening distended gallbladder with  wall thickening and trace pericholecystic fluid with.  Findings are nonspecific could be secondary to liver disease however there is concern for acute cholecystitis correlation with ultrasound is recommended.  Mild ascending colon wall thickening decreased compared to the prior CT now with large amount of stool present throughout the colon.     ED Course: ***  Review of Systems: Review of systems as noted in the HPI. All other systems reviewed and are negative.   Past Medical History:  Diagnosis Date   Pancreatitis    PTSD (post-traumatic stress disorder)    PTSD (post-traumatic stress disorder)    Past Surgical History:  Procedure Laterality Date   ABDOMINAL SURGERY     scrapnel   APPENDECTOMY     arm surgery     KNEE ARTHROSCOPY     R forearm surgery      Social History:  reports that he has been smoking cigarettes. He has never used smokeless tobacco. He reports current alcohol use of about 6.0 standard drinks of alcohol per week. He reports current drug use.   Allergies[1]  Family History  Problem Relation Age of Onset   Cancer Mother    Bipolar disorder Mother    Cancer Father    Pancreatitis Father     ***  Prior to Admission medications  Medication Sig Start Date End Date Taking? Authorizing Provider  folic acid  (FOLVITE ) 1 MG tablet Take 1 tablet (1 mg total) by mouth daily. 06/20/24 07/20/24  Arlice Reichert, MD  furosemide  (LASIX ) 40 MG tablet Take 1 tablet (40 mg total) by mouth daily. 06/19/24 09/17/24  Arlice Reichert, MD  lactulose  (CHRONULAC ) 10 GM/15ML solution Take 30 mLs (20 g total) by mouth  2 (two) times daily. 06/19/24 07/19/24  Arlice Reichert, MD  Multiple Vitamin (MULTIVITAMIN WITH MINERALS) TABS tablet Take 1 tablet by mouth daily. 06/20/24 07/20/24  Arlice Reichert, MD  pantoprazole  (PROTONIX ) 40 MG tablet Take 1 tablet (40 mg total) by mouth at bedtime. 06/19/24 07/19/24  Arlice Reichert, MD  potassium chloride  SA (KLOR-CON  M) 20 MEQ tablet Take 1  tablet (20 mEq total) by mouth daily. 06/20/24 07/20/24  Arlice Reichert, MD  rifaximin  (XIFAXAN ) 550 MG TABS tablet Take 1 tablet (550 mg total) by mouth 2 (two) times daily. 06/19/24 07/19/24  Arlice Reichert, MD  spironolactone  (ALDACTONE ) 100 MG tablet Take 1 tablet (100 mg total) by mouth daily. 06/20/24 07/20/24  Arlice Reichert, MD  thiamine  (VITAMIN B-1) 100 MG tablet Take 1 tablet (100 mg total) by mouth daily. 06/20/24 07/20/24  Arlice Reichert, MD    Physical Exam: BP 113/70   Pulse 99   Temp 99 F (37.2 C) (Oral)   Resp 16   Ht 6' 1 (1.854 m)   Wt 62.6 kg   SpO2 96%   BMI 18.21 kg/m   General: 44 y.o. year-old male well developed well nourished in no acute distress.  Alert and oriented x3. Cardiovascular: Regular rate and rhythm with no rubs or gallops.  No thyromegaly or JVD noted.  No lower extremity edema. 2/4 pulses in all 4 extremities. Respiratory: Clear to auscultation with no wheezes or rales. Good inspiratory effort. Abdomen: Soft nontender nondistended with normal bowel sounds x4 quadrants. Muskuloskeletal: No cyanosis, clubbing or edema noted bilaterally Neuro: CN II-XII intact, strength, sensation, reflexes Skin: No ulcerative lesions noted or rashes Psychiatry: Judgement and insight appear normal. Mood is appropriate for condition and setting          Labs on Admission:  Basic Metabolic Panel: Recent Labs  Lab 06/19/24 0431 06/25/24 1959  NA 134* 129*  K 3.4* 4.9  CL 95* 96*  CO2 26 23  GLUCOSE 132* 155*  BUN 14 13  CREATININE 0.91 0.69  CALCIUM 8.7* 8.9   Liver Function Tests: Recent Labs  Lab 06/25/24 1959  AST 50*  ALT RESULTS UNAVAILABLE DUE TO INTERFERING SUBSTANCE  ALKPHOS 143*  BILITOT 21.1*  PROT 7.0  ALBUMIN  3.4*   Recent Labs  Lab 06/25/24 2042  LIPASE 107*   Recent Labs  Lab 06/25/24 2049  AMMONIA 23   CBC: Recent Labs  Lab 06/19/24 0431 06/25/24 1959  WBC 9.8 28.5*  NEUTROABS 4.8 21.9*  HGB 8.9* 9.4*  HCT 24.7* 26.9*  MCV  94.3 101.5*  PLT 64* 140*   Cardiac Enzymes: No results for input(s): CKTOTAL, CKMB, CKMBINDEX, TROPONINI in the last 168 hours.  BNP (last 3 results) No results for input(s): BNP in the last 8760 hours.  ProBNP (last 3 results) No results for input(s): PROBNP in the last 8760 hours.  CBG: No results for input(s): GLUCAP in the last 168 hours.  Radiological Exams on Admission: CT CHEST ABDOMEN PELVIS W CONTRAST Result Date: 06/25/2024 CLINICAL DATA:  Abnormal labs elevated white count jaundice EXAM: CT CHEST, ABDOMEN, AND PELVIS WITH CONTRAST TECHNIQUE: Multidetector CT imaging of the chest, abdomen and pelvis was performed following the standard protocol during bolus administration of intravenous contrast. RADIATION DOSE REDUCTION: This exam was performed according to the departmental dose-optimization program which includes automated exposure control, adjustment of the mA and/or kV according to patient size and/or use of iterative reconstruction technique. CONTRAST:  OMNIPAQUE  IOHEXOL  300 MG/ML  SOLN COMPARISON:  Chest x-ray  06/25/2024, CT 06/14/2024, MRI 11/04/2021 FINDINGS: CT CHEST FINDINGS Cardiovascular: Nonaneurysmal aorta. Normal cardiac size. No pericardial effusion. Mediastinum/Nodes: Patent trachea. No thyroid mass. No suspicious lymph nodes. Lungs/Pleura: Moderate left-sided pleural effusion, decreased compared to prior. Resolved right pleural effusion. Improved aeration of left upper lobe. Probable passive atelectasis in the left lower lobe but pneumonia not excluded. Musculoskeletal: Sternum appears intact. No acute osseous abnormality. CT ABDOMEN PELVIS FINDINGS Hepatobiliary: Liver cirrhosis. Distended gallbladder with wall thickening. Minimal mural calcification as before. Trace pericholecystic fluid. No biliary dilatation Pancreas: Unremarkable. No pancreatic ductal dilatation or surrounding inflammatory changes. Spleen: Enlarged, measures 16 cm craniocaudal.  Adrenals/Urinary Tract: Adrenal glands are normal. Kidneys show no hydronephrosis. The bladder is normal Stomach/Bowel: Stomach unremarkable. No dilated small bowel. Mild ascending colon wall thickening or but decreased compared to the prior CT, now large amount of stool present throughout the colon. Vascular/Lymphatic: Mild atherosclerosis. No aneurysm. No suspicious lymph nodes. Numerous gastroesophageal varices. Reproductive: Negative prostate Other: No free air. Decreased small volume ascites compared to prior. Suspicion of thin rim enhancing fluid collection within the right gutter with surrounding soft tissue stranding, this measures 3 point 8 cm x 1.2 cm by 7.5 cm craniocaudal. Musculoskeletal: No acute osseous abnormality. IMPRESSION: 1. Moderate left-sided pleural effusion, decreased compared to prior. Resolved right pleural effusion. Improved aeration of left upper lobe. Probable passive atelectasis in the left lower lobe but pneumonia not excluded. 2. Liver cirrhosis with evidence of portal hypertension. Large gastroesophageal varices and splenomegaly. Decreased small volume ascites compared to prior. 3. Interim finding of long thin rim enhancing fluid collection along the right anterior pararenal space and right colic gutter, this is suspicious for a developing abscess. 4. Distended gallbladder with wall thickening and trace pericholecystic fluid. Findings are nonspecific and could be secondary to liver disease, however if there is concern for acute cholecystitis, correlation with ultrasound is recommended. 5. Mild ascending colon wall thickening, decreased compared to the prior CT, now with large amount of stool present throughout the colon. Electronically Signed   By: Luke Bun M.D.   On: 06/25/2024 22:32   DG Chest Port 1 View Result Date: 06/25/2024 EXAM: 1 VIEW(S) XRAY OF THE CHEST 06/25/2024 08:49:00 PM COMPARISON: 06/16/2024 CLINICAL HISTORY: Shortness of breath. FINDINGS: LUNGS AND PLEURA:  Moderate left pleural effusion, mildly decreased from prior exam. Left lung base opacity, likely atelectasis. No pneumothorax. HEART AND MEDIASTINUM: No acute abnormality of the cardiac and mediastinal silhouettes. BONES AND SOFT TISSUES: No acute osseous abnormality. IMPRESSION: 1. Moderate left pleural effusion, mildly decreased from prior. Electronically signed by: Pinkie Pebbles MD 06/25/2024 09:07 PM EST RP Workstation: [id number]    EKG: I independently viewed the EKG done and my findings are as followed: ***   Assessment/Plan Present on Admission:  Intra-abdominal abscess (HCC)  Principal Problem:   Intra-abdominal abscess (HCC)   DVT prophylaxis: ***   Code Status: ***   Family Communication: ***   Disposition Plan: ***   Consults called: ***   Admission status: ***    Status is: Inpatient {Inpatient:23812}   Terry LOISE Hurst MD [facility] Pager [phone]  If 7PM-7AM, please contact night-coverage [email or web address] Password [facility]  06/25/2024, 11:15 PM        [1] No Known Allergies "

## 2024-06-26 DIAGNOSIS — K651 Peritoneal abscess: Secondary | ICD-10-CM | POA: Diagnosis not present

## 2024-06-26 LAB — COMPREHENSIVE METABOLIC PANEL WITH GFR
ALT: <5 U/L (ref 0–44)
AST: 36 U/L (ref 15–41)
Albumin: 2.7 g/dL — ABNORMAL LOW (ref 3.5–5.0)
Alkaline Phosphatase: 137 U/L — ABNORMAL HIGH (ref 38–126)
Anion gap: 9 (ref 5–15)
BUN: 11 mg/dL (ref 6–20)
CO2: 21 mmol/L — ABNORMAL LOW (ref 22–32)
Calcium: 7.8 mg/dL — ABNORMAL LOW (ref 8.9–10.3)
Chloride: 103 mmol/L (ref 98–111)
Creatinine, Ser: 0.55 mg/dL — ABNORMAL LOW (ref 0.61–1.24)
GFR, Estimated: >60 mL/min
Glucose, Bld: 125 mg/dL — ABNORMAL HIGH (ref 70–99)
Potassium: 4.3 mmol/L (ref 3.5–5.1)
Sodium: 133 mmol/L — ABNORMAL LOW (ref 135–145)
Total Bilirubin: 16.8 mg/dL — ABNORMAL HIGH (ref 0.0–1.2)
Total Protein: 5.8 g/dL — ABNORMAL LOW (ref 6.5–8.1)

## 2024-06-26 LAB — PHOSPHORUS: Phosphorus: 2.4 mg/dL — ABNORMAL LOW (ref 2.5–4.6)

## 2024-06-26 LAB — CBC
HCT: 24.5 % — ABNORMAL LOW (ref 39.0–52.0)
Hemoglobin: 8.2 g/dL — ABNORMAL LOW (ref 13.0–17.0)
MCH: 34.5 pg — ABNORMAL HIGH (ref 26.0–34.0)
MCHC: 33.5 g/dL (ref 30.0–36.0)
MCV: 102.9 fL — ABNORMAL HIGH (ref 80.0–100.0)
Platelets: 119 K/uL — ABNORMAL LOW (ref 150–400)
RBC: 2.38 MIL/uL — ABNORMAL LOW (ref 4.22–5.81)
RDW: 21 % — ABNORMAL HIGH (ref 11.5–15.5)
WBC: 25.3 K/uL — ABNORMAL HIGH (ref 4.0–10.5)
nRBC: 0 % (ref 0.0–0.2)

## 2024-06-26 LAB — LACTIC ACID, PLASMA: Lactic Acid, Venous: 1.2 mmol/L (ref 0.5–1.9)

## 2024-06-26 LAB — MRSA NEXT GEN BY PCR, NASAL: MRSA by PCR Next Gen: NOT DETECTED

## 2024-06-26 LAB — C-REACTIVE PROTEIN: CRP: 3.6 mg/dL — ABNORMAL HIGH

## 2024-06-26 LAB — MAGNESIUM: Magnesium: 1.7 mg/dL (ref 1.7–2.4)

## 2024-06-26 MED ORDER — LINEZOLID 600 MG/300ML IV SOLN
600.0000 mg | Freq: Two times a day (BID) | INTRAVENOUS | Status: DC
  Administered 2024-06-26: 600 mg via INTRAVENOUS
  Filled 2024-06-26: qty 300

## 2024-06-26 MED ORDER — PIPERACILLIN-TAZOBACTAM 3.375 G IVPB
3.3750 g | Freq: Three times a day (TID) | INTRAVENOUS | Status: DC
  Administered 2024-06-26 – 2024-07-02 (×20): 3.375 g via INTRAVENOUS
  Filled 2024-06-26 (×20): qty 50

## 2024-06-26 NOTE — Progress Notes (Signed)
 OT Cancellation/screen Note  Patient Details Name: Jeffrey Mathis MRN: [id number] DOB: March 27, 1981   Cancelled Treatment:    Reason Eval/Treat Not Completed: OT screened, no needs identified, will sign off  Leita Howell, OTR/L,CBIS  Supplemental OT - [facility] and [facility] Secure Chat Preferred   06/26/2024, 1:00 PM

## 2024-06-26 NOTE — Plan of Care (Signed)
  Problem: Education: Goal: Knowledge of General Education information will improve Description: Including pain rating scale, medication(s)/side effects and non-pharmacologic comfort measures Outcome: Progressing   Problem: Clinical Measurements: Goal: Ability to maintain clinical measurements within normal limits will improve Outcome: Progressing   Problem: Coping: Goal: Level of anxiety will decrease Outcome: Progressing   

## 2024-06-26 NOTE — Progress Notes (Signed)
 Interventional Radiology Brief Note:  Jeffrey Mathis is a 44 year old male with end-stage alcoholic cirrhosis re-admitted after recent hospital stay for worsening jaundice.  He presented to the [facility] ED overnight with tachycardia, elevated WBC with CT imaging showing a possible intra-abdominal abscess.  IR consulted for aspiration and drainage.  CT reviewed by Dr. Luverne who notes there is no discernable collection or abscess amenable to drain placement.  No procedure in IR at this time.  Please re-consult if future imaging shows an approachable collection.   Patient does have a large left pleural effusion, recurrent.  Discussed with Dr. Rojelio.  Will plan for thoracentesis as able.   Jeffrey [place]un[place], MS RD PA-C 11:45 AM

## 2024-06-26 NOTE — Progress Notes (Signed)
 " PROGRESS NOTE    Jeffrey Mathis  FMW:[id number] DOB: 07/15/1980 DOA: 06/25/2024 PCP: [facility]     Brief Narrative:  Jeffrey Mathis is a 44 y.o. male with medical history significant for liver cirrhosis, GERD, history of alcohol abuse (none since December 2018), recurrent pancreatitis, who presents to the ER due to abnormal lab results.  The patient went to his primary care provider at the [facility] for a post hospital follow up appointment.  The patient was recently admitted from 06/14/2024 through 06/19/2024 for large left pleural effusion, hepatic hydrothorax/hemothorax with acute hypoxic respiratory failure, and E. coli bacteremia.  Outpatient blood work returned today with leukocytosis 34,000.  The patient was advised to go to the ER for further evaluation.  He presented to the ER at [facility] accompanied by his wife.   CT abdomen pelvis with contrast revealed moderate left-sided pleural effusion, decreased compared to prior, resolved right pleural effusion.  Improved aeration of the left upper lobe.  Liver cirrhosis with evidence of portal hypertension.  Large gastroesophageal varices and splenomegaly.  Decreased small volume ascites compared to prior.  Interim finding of long enhancing fluid collection along the right anterior pararenal space and the right colic gutter this is suspicious for developing abscess.      New events last 24 hours / Subjective: Patient denies any symptoms whatsoever.  Denies any fevers or chills, nausea, vomiting, abdominal pain.  He states that he is in his baseline condition, came in because his WBC was elevated.  Assessment & Plan:    Principal Problem:   Intra-abdominal abscess (HCC) Active Problems:   Leukocytosis   Hyperbilirubinemia   Esophageal varices (HCC)   Portal hypertension (HCC)   Coagulopathy   Alcoholic cirrhosis of liver with ascites (HCC)   Thrombocytopenia   Pleural effusion   Possible developing  intra-abdominal abscess with leukocytosis - Was recently admitted for E. coli bacteremia, completed cefadroxil  and Flagyl  - Discussed with IR, they reviewed imaging, there is no discernible collection or abscess amenable to drain placement. - Continue Zosyn  and monitor WBC  End-stage liver disease, alcoholic cirrhosis with ascites, portal hypertension, gastroesophageal varices, splenomegaly, jaundice, coagulopathy, thrombocytopenia - Continue Lasix , spironolactone , lactulose , Xifaxan   Moderate left-sided pleural effusion - Thoracentesis ordered   DVT prophylaxis:  SCDs Start: 06/25/24 2316  Code Status: Full code Family Communication: None at bedside Disposition Plan: Home Status is: Inpatient Remains inpatient appropriate because: IV antibiotics    Antimicrobials:  Anti-infectives (From admission, onward)    Start     Dose/Rate Route Frequency Ordered Stop   06/26/24 1000  vancomycin  (VANCOCIN ) IVPB 1000 mg/200 mL premix  Status:  Discontinued        1,000 mg 200 mL/hr over 60 Minutes Intravenous Every 12 hours 06/25/24 2327 06/26/24 0026   06/26/24 1000  linezolid  (ZYVOX ) IVPB 600 mg  Status:  Discontinued        600 mg 300 mL/hr over 60 Minutes Intravenous Every 12 hours 06/26/24 0026 06/26/24 1202   06/26/24 0800  rifaximin  (XIFAXAN ) tablet 550 mg        550 mg Oral 2 times daily 06/25/24 2330     06/26/24 0400  piperacillin -tazobactam (ZOSYN ) IVPB 3.375 g        3.375 g 12.5 mL/hr over 240 Minutes Intravenous Every 8 hours 06/26/24 0023     06/25/24 2245  vancomycin  (VANCOREADY) IVPB 1250 mg/250 mL        1,250 mg 166.7 mL/hr over 90 Minutes Intravenous  Once 06/25/24 2239 06/26/24 0031   06/25/24 2100  piperacillin -tazobactam (ZOSYN ) IVPB 3.375 g        3.375 g 100 mL/hr over 30 Minutes Intravenous  Once 06/25/24 2051 06/25/24 2130        Objective: Vitals:   06/26/24 0617 06/26/24 0618 06/26/24 0804 06/26/24 0942  BP: 105/61  108/64 128/67  Pulse:  95 (!)  104 95  Resp: 17 (!) 21 18 18   Temp: 98.6 F (37 C)  98.4 F (36.9 C) 98.6 F (37 C)  TempSrc:   Oral Oral  SpO2:  97% 98% 100%  Weight:      Height:       No intake or output data in the 24 hours ending 06/26/24 1258 Filed Weights   06/25/24 1924  Weight: 62.6 kg    Examination:  General exam: Appears calm and comfortable, jaundiced with scleral icterus Respiratory system: Diminished breath sounds.  Respiratory effort normal. No respiratory distress. No conversational dyspnea.  Cardiovascular system: S1 & S2 heard, RRR. No murmurs. No pedal edema. Gastrointestinal system: Abdomen is nondistended, soft and nontender.  Central nervous system: Alert and oriented. No focal neurological deficits. Speech clear.  Extremities: Symmetric in appearance  Psychiatry: Judgement and insight appear normal. Mood & affect appropriate.   Data Reviewed: I have personally reviewed following labs and imaging studies  CBC: Recent Labs  Lab 06/25/24 1959 06/26/24 0430  WBC 28.5* 25.3*  NEUTROABS 21.9*  --   HGB 9.4* 8.2*  HCT 26.9* 24.5*  MCV 101.5* 102.9*  PLT 140* 119*   Basic Metabolic Panel: Recent Labs  Lab 06/25/24 1959 06/26/24 0430  NA 129* 133*  K 4.9 4.3  CL 96* 103  CO2 23 21*  GLUCOSE 155* 125*  BUN 13 11  CREATININE 0.69 0.55*  CALCIUM 8.9 7.8*  MG  --  1.7  PHOS  --  2.4*   GFR: Estimated Creatinine Clearance: 107.6 mL/min (A) (by C-G formula based on SCr of 0.55 mg/dL (L)). Liver Function Tests: Recent Labs  Lab 06/25/24 1959 06/26/24 0430  AST 50* 36  ALT RESULTS UNAVAILABLE DUE TO INTERFERING SUBSTANCE <5  ALKPHOS 143* 137*  BILITOT 21.1* 16.8*  PROT 7.0 5.8*  ALBUMIN  3.4* 2.7*   Recent Labs  Lab 06/25/24 2042  LIPASE 107*   Recent Labs  Lab 06/25/24 2049  AMMONIA 23   Coagulation Profile: Recent Labs  Lab 06/25/24 2042  INR 2.8*   Cardiac Enzymes: No results for input(s): CKTOTAL, CKMB, CKMBINDEX, TROPONINI in the last 168  hours. BNP (last 3 results) No results for input(s): PROBNP in the last 8760 hours. HbA1C: No results for input(s): HGBA1C in the last 72 hours. CBG: No results for input(s): GLUCAP in the last 168 hours. Lipid Profile: No results for input(s): CHOL, HDL, LDLCALC, TRIG, CHOLHDL, LDLDIRECT in the last 72 hours. Thyroid Function Tests: No results for input(s): TSH, T4TOTAL, FREET4, T3FREE, THYROIDAB in the last 72 hours. Anemia Panel: No results for input(s): VITAMINB12, FOLATE, FERRITIN, TIBC, IRON, RETICCTPCT in the last 72 hours. Sepsis Labs: Recent Labs  Lab 06/25/24 2306 06/25/24 2345  LATICACIDVEN 1.2 1.2    Recent Results (from the past 240 hours)  Blood culture (routine x 2)     Status: None (Preliminary result)   Collection Time: 06/25/24  8:42 PM   Specimen: BLOOD  Result Value Ref Range Status   Specimen Description   Final    BLOOD LEFT ANTECUBITAL Performed at [facility], [address] [place], [place] [address]    Special Requests   Final    BOTTLES DRAWN AEROBIC AND ANAEROBIC Blood Culture results may not be optimal due to an inadequate volume of blood received in culture bottles Performed at [facility], [address] [place], [place] [address]    Culture   Final    NO GROWTH < 12 HOURS Performed at [facility], [address], [place], [place] [address]    Report Status PENDING  Incomplete  Blood culture (routine x 2)     Status: None (Preliminary result)   Collection Time: 06/25/24  8:55 PM   Specimen: BLOOD  Result Value Ref Range Status   Specimen Description   Final    BLOOD RIGHT ANTECUBITAL Performed at [facility], [address] [place], [place] [address]    Special Requests   Final    BOTTLES DRAWN AEROBIC AND ANAEROBIC Blood Culture results may not be optimal due to an inadequate volume of blood received in culture bottles Performed  at [facility] [address] [place], [place] [address]    Culture   Final    NO GROWTH < 12 HOURS Performed at [facility], [address] [place], [place] [address]    Report Status PENDING  Incomplete  Resp panel by RT-PCR (RSV, Flu A&B, Covid) Anterior Nasal Swab     Status: None   Collection Time: 06/25/24  9:06 PM   Specimen: Anterior Nasal Swab  Result Value Ref Range Status   SARS Coronavirus 2 by RT PCR NEGATIVE NEGATIVE Final    Comment: (NOTE) SARS-CoV-2 target nucleic acids are NOT DETECTED.  The SARS-CoV-2 RNA is generally detectable in upper respiratory specimens during the acute phase of infection. The lowest concentration of SARS-CoV-2 viral copies this assay can detect is 138 copies/mL. A negative result does not preclude SARS-Cov-2 infection and should not be used as the sole basis for treatment or other patient management decisions. A negative result may occur with  improper specimen collection/handling, submission of specimen other than nasopharyngeal swab, presence of viral mutation(s) within the areas targeted by this assay, and inadequate number of viral copies(<138 copies/mL). A negative result must be combined with clinical observations, patient history, and epidemiological information. The expected result is Negative.  Fact Sheet for Patients:  bloggercourse.com  Fact Sheet for Healthcare Providers:  seriousbroker.it  This test is no t yet approved or cleared by the [place]  FDA and  has been authorized for detection and/or diagnosis of SARS-CoV-2 by FDA under an Emergency Use Authorization (EUA). This EUA will remain  in effect (meaning this test can be used) for the duration of the COVID-19 declaration under Section 564(b)(1) of the Act, 21 U.S.C.section 360bbb-3(b)(1), unless the authorization is terminated  or revoked sooner.       Influenza A by PCR NEGATIVE  NEGATIVE Final   Influenza B by PCR NEGATIVE NEGATIVE Final    Comment: (NOTE) The Xpert Xpress SARS-CoV-2/FLU/RSV plus assay is intended as an aid in the diagnosis of influenza from Nasopharyngeal swab specimens and should not be used as a sole basis for treatment. Nasal washings and aspirates are unacceptable for Xpert Xpress SARS-CoV-2/FLU/RSV testing.  Fact Sheet for Patients: bloggercourse.com  Fact Sheet for Healthcare Providers: seriousbroker.it  This test is not yet approved or cleared by the [place]  FDA and has been authorized for detection and/or diagnosis of SARS-CoV-2 by FDA under an Emergency  Use Authorization (EUA). This EUA will remain in effect (meaning this test can be used) for the duration of the COVID-19 declaration under Section 564(b)(1) of the Act, 21 U.S.C. section 360bbb-3(b)(1), unless the authorization is terminated or revoked.     Resp Syncytial Virus by PCR NEGATIVE NEGATIVE Final    Comment: (NOTE) Fact Sheet for Patients: bloggercourse.com  Fact Sheet for Healthcare Providers: seriousbroker.it  This test is not yet approved or cleared by the [place]  FDA and has been authorized for detection and/or diagnosis of SARS-CoV-2 by FDA under an Emergency Use Authorization (EUA). This EUA will remain in effect (meaning this test can be used) for the duration of the COVID-19 declaration under Section 564(b)(1) of the Act, 21 U.S.C. section 360bbb-3(b)(1), unless the authorization is terminated or revoked.  Performed at [facility], [address], [place], [place] [address]   MRSA Next Gen by PCR, Nasal     Status: None   Collection Time: 06/26/24  4:30 AM   Specimen: Nasal Mucosa; Nasal Swab  Result Value Ref Range Status   MRSA by PCR Next Gen NOT DETECTED NOT DETECTED Final    Comment: (NOTE) The GeneXpert MRSA Assay  (FDA approved for NASAL specimens only), is one component of a comprehensive MRSA colonization surveillance program. It is not intended to diagnose MRSA infection nor to guide or monitor treatment for MRSA infections. Test performance is not FDA approved in patients less than 48 years old. Performed at [facility], [address] [place], [place] [address]       Radiology Studies: CT CHEST ABDOMEN PELVIS W CONTRAST Result Date: 06/25/2024 CLINICAL DATA:  Abnormal labs elevated white count jaundice EXAM: CT CHEST, ABDOMEN, AND PELVIS WITH CONTRAST TECHNIQUE: Multidetector CT imaging of the chest, abdomen and pelvis was performed following the standard protocol during bolus administration of intravenous contrast. RADIATION DOSE REDUCTION: This exam was performed according to the departmental dose-optimization program which includes automated exposure control, adjustment of the mA and/or kV according to patient size and/or use of iterative reconstruction technique. CONTRAST:  OMNIPAQUE  IOHEXOL  300 MG/ML  SOLN COMPARISON:  Chest x-ray 06/25/2024, CT 06/14/2024, MRI 11/04/2021 FINDINGS: CT CHEST FINDINGS Cardiovascular: Nonaneurysmal aorta. Normal cardiac size. No pericardial effusion. Mediastinum/Nodes: Patent trachea. No thyroid mass. No suspicious lymph nodes. Lungs/Pleura: Moderate left-sided pleural effusion, decreased compared to prior. Resolved right pleural effusion. Improved aeration of left upper lobe. Probable passive atelectasis in the left lower lobe but pneumonia not excluded. Musculoskeletal: Sternum appears intact. No acute osseous abnormality. CT ABDOMEN PELVIS FINDINGS Hepatobiliary: Liver cirrhosis. Distended gallbladder with wall thickening. Minimal mural calcification as before. Trace pericholecystic fluid. No biliary dilatation Pancreas: Unremarkable. No pancreatic ductal dilatation or surrounding inflammatory changes. Spleen: Enlarged, measures 16 cm  craniocaudal. Adrenals/Urinary Tract: Adrenal glands are normal. Kidneys show no hydronephrosis. The bladder is normal Stomach/Bowel: Stomach unremarkable. No dilated small bowel. Mild ascending colon wall thickening or but decreased compared to the prior CT, now large amount of stool present throughout the colon. Vascular/Lymphatic: Mild atherosclerosis. No aneurysm. No suspicious lymph nodes. Numerous gastroesophageal varices. Reproductive: Negative prostate Other: No free air. Decreased small volume ascites compared to prior. Suspicion of thin rim enhancing fluid collection within the right gutter with surrounding soft tissue stranding, this measures 3 point 8 cm x 1.2 cm by 7.5 cm craniocaudal. Musculoskeletal: No acute osseous abnormality. IMPRESSION: 1. Moderate left-sided pleural effusion, decreased compared to prior. Resolved right pleural effusion. Improved aeration of left upper lobe. Probable  passive atelectasis in the left lower lobe but pneumonia not excluded. 2. Liver cirrhosis with evidence of portal hypertension. Large gastroesophageal varices and splenomegaly. Decreased small volume ascites compared to prior. 3. Interim finding of long thin rim enhancing fluid collection along the right anterior pararenal space and right colic gutter, this is suspicious for a developing abscess. 4. Distended gallbladder with wall thickening and trace pericholecystic fluid. Findings are nonspecific and could be secondary to liver disease, however if there is concern for acute cholecystitis, correlation with ultrasound is recommended. 5. Mild ascending colon wall thickening, decreased compared to the prior CT, now with large amount of stool present throughout the colon. Electronically Signed   By: Luke Bun M.D.   On: 06/25/2024 22:32   DG Chest Port 1 View Result Date: 06/25/2024 EXAM: 1 VIEW(S) XRAY OF THE CHEST 06/25/2024 08:49:00 PM COMPARISON: 06/16/2024 CLINICAL HISTORY: Shortness of breath. FINDINGS:  LUNGS AND PLEURA: Moderate left pleural effusion, mildly decreased from prior exam. Left lung base opacity, likely atelectasis. No pneumothorax. HEART AND MEDIASTINUM: No acute abnormality of the cardiac and mediastinal silhouettes. BONES AND SOFT TISSUES: No acute osseous abnormality. IMPRESSION: 1. Moderate left pleural effusion, mildly decreased from prior. Electronically signed by: Pinkie Pebbles MD 06/25/2024 09:07 PM EST RP Workstation: [id number]      Scheduled Meds:  folic acid   1 mg Oral Daily   furosemide   40 mg Oral Daily   lactulose   20 g Oral BID   multivitamin with minerals  1 tablet Oral Daily   pantoprazole   40 mg Oral QHS   potassium chloride  SA  20 mEq Oral Daily   rifaximin   550 mg Oral BID   spironolactone   100 mg Oral Daily   thiamine   100 mg Oral Daily   Continuous Infusions:  piperacillin -tazobactam Stopped (06/26/24 0819)     LOS: 1 day   Time spent: 25 minutes   Delon Hoe, DO [facility] 06/26/2024, 12:58 PM   Available via Epic secure chat 7am-7pm After these hours, please refer to coverage provider listed on [email or web address]  "

## 2024-06-26 NOTE — Evaluation (Signed)
 Physical Therapy Evaluation Patient Details Name: Jeffrey Mathis MRN: [id number] DOB: 1981/01/21 Today's Date: 06/26/2024  History of Present Illness  Pt is 44 yo male admitted on 06/25/24 with intra-abdominal abscess, end stage liver disease, and Pleural effusion with thoracentesis pending.  Pt with hx including but not limited to alcoholic liver cirrhosis, esophageal varices ongoing alcohol use, pancreatitis, PTSD  Clinical Impression  Pt admitted with above diagnosis.  Pt familiar to this PT from recent admission.  Pt now ambulating 400' independently without difficulty and VSS.  Pt reports no pain , fatigue, or dizziness.  Pt more stable and improved endurance compared to last admission. No further PT needs - pt agrees.         If plan is discharge home, recommend the following:     Can travel by private vehicle        Equipment Recommendations None recommended by PT  Recommendations for Other Services       Functional Status Assessment Patient has not had a recent decline in their functional status     Precautions / Restrictions Precautions Precautions: None      Mobility  Bed Mobility Overal bed mobility: Independent                  Transfers Overall transfer level: Independent     Sit to Stand: Independent                Ambulation/Gait Ambulation/Gait assistance: Independent Gait Distance (Feet): 400 Feet Assistive device: None Gait Pattern/deviations: WFL(Within Functional Limits) Gait velocity: normal     General Gait Details: Had supervision for evaluation but demonstrated safely at independent level with VSS on RA -sats were 97%.  Denied fatigue and not tremors/shaking like last admission.  Stairs            Wheelchair Mobility     Tilt Bed    Modified Rankin (Stroke Patients Only)       Balance Overall balance assessment: Independent   Sitting balance-Leahy Scale: Normal     Standing balance support: No upper  extremity supported Standing balance-Leahy Scale: Normal                               Pertinent Vitals/Pain Pain Assessment Pain Assessment: No/denies pain    Home Living Family/patient expects to be discharged to:: Private residence Living Arrangements: Spouse/significant other Available Help at Discharge: Family;Available 24 hours/day Type of Home: House Home Access: Stairs to enter Entrance Stairs-Rails: Left Entrance Stairs-Number of Steps: 3 Alternate Level Stairs-Number of Steps: Split level : 5 steps between each level Home Layout: Multi-level Home Equipment: None      Prior Function Prior Level of Function : Independent/Modified Independent;Working/employed;Driving             Mobility Comments: Could ambulate in community without AD; reports getting out and walking for exercise since last admission ADLs Comments: Independent with adls and iadls     Extremity/Trunk Assessment   Upper Extremity Assessment Upper Extremity Assessment: Overall WFL for tasks assessed    Lower Extremity Assessment Lower Extremity Assessment: Overall WFL for tasks assessed    Cervical / Trunk Assessment Cervical / Trunk Assessment: Normal  Communication        Cognition Arousal: Alert Behavior During Therapy: WFL for tasks assessed/performed   PT - Cognitive impairments: No apparent impairments  Cueing       General Comments      Exercises     Assessment/Plan    PT Assessment Patient does not need any further PT services  PT Problem List         PT Treatment Interventions      PT Goals (Current goals can be found in the Care Plan section)  Acute Rehab PT Goals PT Goal Formulation: All assessment and education complete, DC therapy    Frequency       Co-evaluation               AM-PAC PT 6 Clicks Mobility  Outcome Measure Help needed turning from your back to your side while in a flat  bed without using bedrails?: None Help needed moving from lying on your back to sitting on the side of a flat bed without using bedrails?: None Help needed moving to and from a bed to a chair (including a wheelchair)?: None Help needed standing up from a chair using your arms (e.g., wheelchair or bedside chair)?: None Help needed to walk in hospital room?: None Help needed climbing 3-5 steps with a railing? : A Little 6 Click Score: 23    End of Session Equipment Utilized During Treatment: Gait belt Activity Tolerance: Patient tolerated treatment well Patient left: in bed;with call bell/phone within reach Nurse Communication: Mobility status PT Visit Diagnosis: Other abnormalities of gait and mobility (R26.89)    Time: 1212-1220 PT Time Calculation (min) (ACUTE ONLY): 8 min   Charges:   PT Evaluation $PT Eval Low Complexity: 1 Low   PT General Charges $$ ACUTE PT VISIT: 1 Visit         Benjiman, PT Acute Rehab Services [facility] [phone]   Benjiman VEAR Mulberry 06/26/2024, 1:54 PM

## 2024-06-26 NOTE — ED Notes (Signed)
 Breakfast tray provided to patient.

## 2024-06-27 ENCOUNTER — Inpatient Hospital Stay (HOSPITAL_COMMUNITY)

## 2024-06-27 DIAGNOSIS — K651 Peritoneal abscess: Secondary | ICD-10-CM | POA: Diagnosis not present

## 2024-06-27 LAB — CBC
HCT: 24.4 % — ABNORMAL LOW (ref 39.0–52.0)
Hemoglobin: 8.7 g/dL — ABNORMAL LOW (ref 13.0–17.0)
MCH: 35.7 pg — ABNORMAL HIGH (ref 26.0–34.0)
MCHC: 35.7 g/dL (ref 30.0–36.0)
MCV: 100 fL (ref 80.0–100.0)
Platelets: 111 10*3/uL — ABNORMAL LOW (ref 150–400)
RBC: 2.44 MIL/uL — ABNORMAL LOW (ref 4.22–5.81)
RDW: 20.4 % — ABNORMAL HIGH (ref 11.5–15.5)
WBC: 22.5 10*3/uL — ABNORMAL HIGH (ref 4.0–10.5)
nRBC: 0 % (ref 0.0–0.2)

## 2024-06-27 LAB — COMPREHENSIVE METABOLIC PANEL WITH GFR
ALT: 13 U/L (ref 0–44)
AST: 64 U/L — ABNORMAL HIGH (ref 15–41)
Albumin: 3 g/dL — ABNORMAL LOW (ref 3.5–5.0)
Alkaline Phosphatase: 130 U/L — ABNORMAL HIGH (ref 38–126)
Anion gap: 10 (ref 5–15)
BUN: 12 mg/dL (ref 6–20)
CO2: 22 mmol/L (ref 22–32)
Calcium: 8.9 mg/dL (ref 8.9–10.3)
Chloride: 98 mmol/L (ref 98–111)
Creatinine, Ser: 0.68 mg/dL (ref 0.61–1.24)
GFR, Estimated: >60 mL/min
Glucose, Bld: 104 mg/dL — ABNORMAL HIGH (ref 70–99)
Potassium: 4.9 mmol/L (ref 3.5–5.1)
Sodium: 130 mmol/L — ABNORMAL LOW (ref 135–145)
Total Bilirubin: 18.7 mg/dL (ref 0.0–1.2)
Total Protein: 6.2 g/dL — ABNORMAL LOW (ref 6.5–8.1)

## 2024-06-27 LAB — BODY FLUID CELL COUNT WITH DIFFERENTIAL
Eos, Fluid: 0 %
Lymphs, Fluid: 45 %
Monocyte-Macrophage-Serous Fluid: 45 % — ABNORMAL LOW (ref 50–90)
Neutrophil Count, Fluid: 10 % (ref 0–25)
Total Nucleated Cell Count, Fluid: 1197 uL — ABNORMAL HIGH (ref 0–1000)

## 2024-06-27 MED ORDER — LORAZEPAM 2 MG/ML IJ SOLN
1.0000 mg | Freq: Four times a day (QID) | INTRAMUSCULAR | Status: AC | PRN
  Administered 2024-06-27 (×2): 1 mg via INTRAVENOUS
  Filled 2024-06-27 (×2): qty 1

## 2024-06-27 MED ORDER — METHOCARBAMOL 500 MG PO TABS
500.0000 mg | ORAL_TABLET | Freq: Three times a day (TID) | ORAL | Status: DC | PRN
  Administered 2024-06-27 – 2024-06-29 (×3): 500 mg via ORAL
  Filled 2024-06-27 (×3): qty 1

## 2024-06-27 MED ORDER — OXYCODONE HCL 5 MG PO TABS
5.0000 mg | ORAL_TABLET | ORAL | Status: DC | PRN
  Administered 2024-06-27 – 2024-06-29 (×5): 5 mg via ORAL
  Filled 2024-06-27 (×5): qty 1

## 2024-06-27 NOTE — Procedures (Signed)
 PROCEDURE SUMMARY:  Successful US  guided left-sided thoracentesis. Yielded 1.3L of bloody fluid. Patient tolerated procedure well. No immediate complications. EBL = trace  Specimen sent for labs.  Post procedure chest X-ray pending.  Braeleigh Pyper CHRISTELLA Bal PA-C 06/27/2024 12:45 PM

## 2024-06-27 NOTE — Progress Notes (Addendum)
" °  PROGRESS NOTE  Patient reported to RN that he was having significant left sided back pain and SOB following thoracentesis. Patient evaluated at bedside. He is alert, comfortable, in no acute respiratory distress, on room air, without conversational dyspnea. Breath sounds are heard bilaterally. Thoracentesis site without significant bleeding or bruising. He admits to pain starting from left lower back above his iliac crest all the way up toward his left shoulder, as a shooting pain. Movement, taking in deep breaths, cough makes it worse. CXR post-procedure was negative to pneumothorax. Vitals are stable.   Trial pain medication as well as muscle relaxer. Repeat CXR ordered for AM. Monitor closely. Discussed with bedside RN.    Delon Hoe, DO [facility] 06/27/2024, 2:54 PM  Available via Epic secure chat 7am-7pm After these hours, please refer to coverage provider listed on [email or web address]   "

## 2024-06-27 NOTE — Plan of Care (Signed)
  Problem: Education: Goal: Knowledge of General Education information will improve Description: Including pain rating scale, medication(s)/side effects and non-pharmacologic comfort measures Outcome: Progressing   Problem: Clinical Measurements: Goal: Ability to maintain clinical measurements within normal limits will improve Outcome: Progressing Goal: Respiratory complications will improve Outcome: Progressing   

## 2024-06-27 NOTE — Progress Notes (Signed)
 " PROGRESS NOTE    Jeffrey Mathis  FMW:[id number] DOB: 1981-05-06 DOA: 06/25/2024 PCP: [facility]     Brief Narrative:  Jeffrey Mathis is a 44 y.o. male with medical history significant for liver cirrhosis, GERD, history of alcohol abuse (none since December 2018), recurrent pancreatitis, who presents to the ER due to abnormal lab results.  The patient went to his primary care provider at the [facility] for a post hospital follow up appointment.  The patient was recently admitted from 06/14/2024 through 06/19/2024 for large left pleural effusion, hepatic hydrothorax/hemothorax with acute hypoxic respiratory failure, and E. coli bacteremia.  Outpatient blood work returned today with leukocytosis 34,000.  The patient was advised to go to the ER for further evaluation.  He presented to the ER at [facility] accompanied by his wife.   CT abdomen pelvis with contrast revealed moderate left-sided pleural effusion, decreased compared to prior, resolved right pleural effusion.  Improved aeration of the left upper lobe.  Liver cirrhosis with evidence of portal hypertension.  Large gastroesophageal varices and splenomegaly.  Decreased small volume ascites compared to prior.  Interim finding of long enhancing fluid collection along the right anterior pararenal space and the right colic gutter this is suspicious for developing abscess.      New events last 24 hours / Subjective: Feeling well, denies any symptoms today.  Remains on room air, denies any shortness of breath  Assessment & Plan:    Principal Problem:   Intra-abdominal abscess (HCC) Active Problems:   Leukocytosis   Hyperbilirubinemia   Esophageal varices (HCC)   Portal hypertension (HCC)   Coagulopathy   Alcoholic cirrhosis of liver with ascites (HCC)   Thrombocytopenia   Pleural effusion   Possible developing intra-abdominal abscess with leukocytosis - Was recently admitted for E. coli bacteremia, completed  cefadroxil  and Flagyl  - Discussed with IR, they reviewed imaging, there is no discernible collection or abscess amenable to drain placement. - Continue Zosyn  and monitor WBC - Leukocytosis improved  End-stage liver disease, alcoholic cirrhosis with ascites, portal hypertension, gastroesophageal varices, splenomegaly, jaundice, coagulopathy, thrombocytopenia - Continue Lasix , spironolactone , lactulose , Xifaxan   Moderate left-sided pleural effusion - Thoracentesis pending   DVT prophylaxis:  SCDs Start: 06/25/24 2316  Code Status: Full code Family Communication: None at bedside Disposition Plan: Home Status is: Inpatient Remains inpatient appropriate because: IV antibiotics    Antimicrobials:  Anti-infectives (From admission, onward)    Start     Dose/Rate Route Frequency Ordered Stop   06/26/24 1000  vancomycin  (VANCOCIN ) IVPB 1000 mg/200 mL premix  Status:  Discontinued        1,000 mg 200 mL/hr over 60 Minutes Intravenous Every 12 hours 06/25/24 2327 06/26/24 0026   06/26/24 1000  linezolid  (ZYVOX ) IVPB 600 mg  Status:  Discontinued        600 mg 300 mL/hr over 60 Minutes Intravenous Every 12 hours 06/26/24 0026 06/26/24 1202   06/26/24 0800  rifaximin  (XIFAXAN ) tablet 550 mg        550 mg Oral 2 times daily 06/25/24 2330     06/26/24 0400  piperacillin -tazobactam (ZOSYN ) IVPB 3.375 g        3.375 g 12.5 mL/hr over 240 Minutes Intravenous Every 8 hours 06/26/24 0023     06/25/24 2245  vancomycin  (VANCOREADY) IVPB 1250 mg/250 mL        1,250 mg 166.7 mL/hr over 90 Minutes Intravenous  Once 06/25/24 2239 06/26/24 0031   06/25/24 2100  piperacillin -tazobactam (ZOSYN ) IVPB  3.375 g        3.375 g 100 mL/hr over 30 Minutes Intravenous  Once 06/25/24 2051 06/25/24 2130        Objective: Vitals:   06/26/24 1340 06/26/24 1740 06/26/24 2134 06/27/24 0430  BP: 103/62 (!) 114/58 (!) 111/59 107/62  Pulse: 88 89 97 89  Resp:  18 16 14   Temp:  98.2 F (36.8 C) 98.8 F (37.1  C) 98.4 F (36.9 C)  TempSrc: Oral Oral Oral Oral  SpO2: 98% 100% 97% 99%  Weight:      Height:        Intake/Output Summary (Last 24 hours) at 06/27/2024 1047 Last data filed at 06/27/2024 9081 Gross per 24 hour  Intake 881.92 ml  Output 250 ml  Net 631.92 ml   Filed Weights   06/25/24 1924  Weight: 62.6 kg    Examination:  General exam: Appears calm and comfortable, jaundiced with scleral icterus Respiratory system:  Respiratory effort normal. No respiratory distress. No conversational dyspnea.  Cardiovascular system: S1 & S2 heard, RRR. No murmurs. No pedal edema. Gastrointestinal system: Abdomen is nondistended, soft and nontender.  Central nervous system: Alert and oriented. No focal neurological deficits. Speech clear.  Extremities: Symmetric in appearance  Psychiatry: Judgement and insight appear normal. Mood & affect appropriate.   Data Reviewed: I have personally reviewed following labs and imaging studies  CBC: Recent Labs  Lab 06/25/24 1959 06/26/24 0430 06/27/24 0531  WBC 28.5* 25.3* 22.5*  NEUTROABS 21.9*  --   --   HGB 9.4* 8.2* 8.7*  HCT 26.9* 24.5* 24.4*  MCV 101.5* 102.9* 100.0  PLT 140* 119* 111*   Basic Metabolic Panel: Recent Labs  Lab 06/25/24 1959 06/26/24 0430 06/27/24 0531  NA 129* 133* 130*  K 4.9 4.3 4.9  CL 96* 103 98  CO2 23 21* 22  GLUCOSE 155* 125* 104*  BUN 13 11 12   CREATININE 0.69 0.55* 0.68  CALCIUM 8.9 7.8* 8.9  MG  --  1.7  --   PHOS  --  2.4*  --    GFR: Estimated Creatinine Clearance: 107.6 mL/min (by C-G formula based on SCr of 0.68 mg/dL). Liver Function Tests: Recent Labs  Lab 06/25/24 1959 06/26/24 0430 06/27/24 0531  AST 50* 36 64*  ALT RESULTS UNAVAILABLE DUE TO INTERFERING SUBSTANCE <5 13  ALKPHOS 143* 137* 130*  BILITOT 21.1* 16.8* 18.7*  PROT 7.0 5.8* 6.2*  ALBUMIN  3.4* 2.7* 3.0*   Recent Labs  Lab 06/25/24 2042  LIPASE 107*   Recent Labs  Lab 06/25/24 2049  AMMONIA 23   Coagulation  Profile: Recent Labs  Lab 06/25/24 2042  INR 2.8*   Cardiac Enzymes: No results for input(s): CKTOTAL, CKMB, CKMBINDEX, TROPONINI in the last 168 hours. BNP (last 3 results) No results for input(s): PROBNP in the last 8760 hours. HbA1C: No results for input(s): HGBA1C in the last 72 hours. CBG: No results for input(s): GLUCAP in the last 168 hours. Lipid Profile: No results for input(s): CHOL, HDL, LDLCALC, TRIG, CHOLHDL, LDLDIRECT in the last 72 hours. Thyroid Function Tests: No results for input(s): TSH, T4TOTAL, FREET4, T3FREE, THYROIDAB in the last 72 hours. Anemia Panel: No results for input(s): VITAMINB12, FOLATE, FERRITIN, TIBC, IRON, RETICCTPCT in the last 72 hours. Sepsis Labs: Recent Labs  Lab 06/25/24 2306 06/25/24 2345  LATICACIDVEN 1.2 1.2    Recent Results (from the past 240 hours)  Blood culture (routine x 2)     Status: None (Preliminary result)  Collection Time: 06/25/24  8:42 PM   Specimen: BLOOD  Result Value Ref Range Status   Specimen Description   Final    BLOOD LEFT ANTECUBITAL Performed at [facility], [address] [place], [place] [address]    Special Requests   Final    BOTTLES DRAWN AEROBIC AND ANAEROBIC Blood Culture results may not be optimal due to an inadequate volume of blood received in culture bottles Performed at [facility], [address] [place], [place] [address]    Culture   Final    NO GROWTH 1 DAY Performed at [facility], [address] [place], [place] [address]    Report Status PENDING  Incomplete  Blood culture (routine x 2)     Status: None (Preliminary result)   Collection Time: 06/25/24  8:55 PM   Specimen: BLOOD  Result Value Ref Range Status   Specimen Description   Final    BLOOD RIGHT ANTECUBITAL Performed at [facility], [address] [place], [place] [address]    Special Requests   Final     BOTTLES DRAWN AEROBIC AND ANAEROBIC Blood Culture results may not be optimal due to an inadequate volume of blood received in culture bottles Performed at [facility], [address] [place], [place] [address]    Culture   Final    NO GROWTH 1 DAY Performed at [facility], [address], [place], [place] [address]    Report Status PENDING  Incomplete  Resp panel by RT-PCR (RSV, Flu A&B, Covid) Anterior Nasal Swab     Status: None   Collection Time: 06/25/24  9:06 PM   Specimen: Anterior Nasal Swab  Result Value Ref Range Status   SARS Coronavirus 2 by RT PCR NEGATIVE NEGATIVE Final    Comment: (NOTE) SARS-CoV-2 target nucleic acids are NOT DETECTED.  The SARS-CoV-2 RNA is generally detectable in upper respiratory specimens during the acute phase of infection. The lowest concentration of SARS-CoV-2 viral copies this assay can detect is 138 copies/mL. A negative result does not preclude SARS-Cov-2 infection and should not be used as the sole basis for treatment or other patient management decisions. A negative result may occur with  improper specimen collection/handling, submission of specimen other than nasopharyngeal swab, presence of viral mutation(s) within the areas targeted by this assay, and inadequate number of viral copies(<138 copies/mL). A negative result must be combined with clinical observations, patient history, and epidemiological information. The expected result is Negative.  Fact Sheet for Patients:  bloggercourse.com  Fact Sheet for Healthcare Providers:  seriousbroker.it  This test is no t yet approved or cleared by the [place]  FDA and  has been authorized for detection and/or diagnosis of SARS-CoV-2 by FDA under an Emergency Use Authorization (EUA). This EUA will remain  in effect (meaning this test can be used) for the duration of the COVID-19 declaration under Section  564(b)(1) of the Act, 21 U.S.C.section 360bbb-3(b)(1), unless the authorization is terminated  or revoked sooner.       Influenza A by PCR NEGATIVE NEGATIVE Final   Influenza B by PCR NEGATIVE NEGATIVE Final    Comment: (NOTE) The Xpert Xpress SARS-CoV-2/FLU/RSV plus assay is intended as an aid in the diagnosis of influenza from Nasopharyngeal swab specimens and should not be used as a sole basis for treatment. Nasal washings and aspirates are unacceptable for Xpert Xpress SARS-CoV-2/FLU/RSV testing.  Fact Sheet for Patients: bloggercourse.com  Fact  Sheet for Healthcare Providers: seriousbroker.it  This test is not yet approved or cleared by the [place]  FDA and has been authorized for detection and/or diagnosis of SARS-CoV-2 by FDA under an Emergency Use Authorization (EUA). This EUA will remain in effect (meaning this test can be used) for the duration of the COVID-19 declaration under Section 564(b)(1) of the Act, 21 U.S.C. section 360bbb-3(b)(1), unless the authorization is terminated or revoked.     Resp Syncytial Virus by PCR NEGATIVE NEGATIVE Final    Comment: (NOTE) Fact Sheet for Patients: bloggercourse.com  Fact Sheet for Healthcare Providers: seriousbroker.it  This test is not yet approved or cleared by the [place]  FDA and has been authorized for detection and/or diagnosis of SARS-CoV-2 by FDA under an Emergency Use Authorization (EUA). This EUA will remain in effect (meaning this test can be used) for the duration of the COVID-19 declaration under Section 564(b)(1) of the Act, 21 U.S.C. section 360bbb-3(b)(1), unless the authorization is terminated or revoked.  Performed at [facility], [address] [place], [place] [address]   MRSA Next Gen by PCR, Nasal     Status: None   Collection Time: 06/26/24  4:30 AM   Specimen:  Nasal Mucosa; Nasal Swab  Result Value Ref Range Status   MRSA by PCR Next Gen NOT DETECTED NOT DETECTED Final    Comment: (NOTE) The GeneXpert MRSA Assay (FDA approved for NASAL specimens only), is one component of a comprehensive MRSA colonization surveillance program. It is not intended to diagnose MRSA infection nor to guide or monitor treatment for MRSA infections. Test performance is not FDA approved in patients less than 45 years old. Performed at [facility], [address] [place], [place] [address]       Radiology Studies: CT CHEST ABDOMEN PELVIS W CONTRAST Result Date: 06/25/2024 CLINICAL DATA:  Abnormal labs elevated white count jaundice EXAM: CT CHEST, ABDOMEN, AND PELVIS WITH CONTRAST TECHNIQUE: Multidetector CT imaging of the chest, abdomen and pelvis was performed following the standard protocol during bolus administration of intravenous contrast. RADIATION DOSE REDUCTION: This exam was performed according to the departmental dose-optimization program which includes automated exposure control, adjustment of the mA and/or kV according to patient size and/or use of iterative reconstruction technique. CONTRAST:  OMNIPAQUE  IOHEXOL  300 MG/ML  SOLN COMPARISON:  Chest x-ray 06/25/2024, CT 06/14/2024, MRI 11/04/2021 FINDINGS: CT CHEST FINDINGS Cardiovascular: Nonaneurysmal aorta. Normal cardiac size. No pericardial effusion. Mediastinum/Nodes: Patent trachea. No thyroid mass. No suspicious lymph nodes. Lungs/Pleura: Moderate left-sided pleural effusion, decreased compared to prior. Resolved right pleural effusion. Improved aeration of left upper lobe. Probable passive atelectasis in the left lower lobe but pneumonia not excluded. Musculoskeletal: Sternum appears intact. No acute osseous abnormality. CT ABDOMEN PELVIS FINDINGS Hepatobiliary: Liver cirrhosis. Distended gallbladder with wall thickening. Minimal mural calcification as before. Trace pericholecystic  fluid. No biliary dilatation Pancreas: Unremarkable. No pancreatic ductal dilatation or surrounding inflammatory changes. Spleen: Enlarged, measures 16 cm craniocaudal. Adrenals/Urinary Tract: Adrenal glands are normal. Kidneys show no hydronephrosis. The bladder is normal Stomach/Bowel: Stomach unremarkable. No dilated small bowel. Mild ascending colon wall thickening or but decreased compared to the prior CT, now large amount of stool present throughout the colon. Vascular/Lymphatic: Mild atherosclerosis. No aneurysm. No suspicious lymph nodes. Numerous gastroesophageal varices. Reproductive: Negative prostate Other: No free air. Decreased small volume ascites compared to prior. Suspicion of thin rim enhancing fluid collection within the right gutter with surrounding soft tissue stranding, this measures 3 point 8  cm x 1.2 cm by 7.5 cm craniocaudal. Musculoskeletal: No acute osseous abnormality. IMPRESSION: 1. Moderate left-sided pleural effusion, decreased compared to prior. Resolved right pleural effusion. Improved aeration of left upper lobe. Probable passive atelectasis in the left lower lobe but pneumonia not excluded. 2. Liver cirrhosis with evidence of portal hypertension. Large gastroesophageal varices and splenomegaly. Decreased small volume ascites compared to prior. 3. Interim finding of long thin rim enhancing fluid collection along the right anterior pararenal space and right colic gutter, this is suspicious for a developing abscess. 4. Distended gallbladder with wall thickening and trace pericholecystic fluid. Findings are nonspecific and could be secondary to liver disease, however if there is concern for acute cholecystitis, correlation with ultrasound is recommended. 5. Mild ascending colon wall thickening, decreased compared to the prior CT, now with large amount of stool present throughout the colon. Electronically Signed   By: Luke Bun M.D.   On: 06/25/2024 22:32   DG Chest Port 1  View Result Date: 06/25/2024 EXAM: 1 VIEW(S) XRAY OF THE CHEST 06/25/2024 08:49:00 PM COMPARISON: 06/16/2024 CLINICAL HISTORY: Shortness of breath. FINDINGS: LUNGS AND PLEURA: Moderate left pleural effusion, mildly decreased from prior exam. Left lung base opacity, likely atelectasis. No pneumothorax. HEART AND MEDIASTINUM: No acute abnormality of the cardiac and mediastinal silhouettes. BONES AND SOFT TISSUES: No acute osseous abnormality. IMPRESSION: 1. Moderate left pleural effusion, mildly decreased from prior. Electronically signed by: Pinkie Pebbles MD 06/25/2024 09:07 PM EST RP Workstation: [id number]      Scheduled Meds:  folic acid   1 mg Oral Daily   furosemide   40 mg Oral Daily   lactulose   20 g Oral BID   multivitamin with minerals  1 tablet Oral Daily   pantoprazole   40 mg Oral QHS   potassium chloride  SA  20 mEq Oral Daily   rifaximin   550 mg Oral BID   spironolactone   100 mg Oral Daily   thiamine   100 mg Oral Daily   Continuous Infusions:  piperacillin -tazobactam 3.375 g (06/27/24 0433)     LOS: 2 days   Time spent: 25 minutes   Delon Hoe, DO [facility] 06/27/2024, 10:47 AM   Available via Epic secure chat 7am-7pm After these hours, please refer to coverage provider listed on [email or web address]  "

## 2024-06-27 NOTE — Progress Notes (Signed)
" °   06/27/24 0719  Provider Notification  Provider Name/Title Rojelio Nest, DO  Date Provider Notified 06/27/24  Time Provider Notified 0720  Method of Notification [facility])  Notification Reason Critical Result;Other (Comment) (Total Bilirubin 18.7)  Provider response No new orders  Date of Provider Response 06/27/24  Time of Provider Response [phone]    "

## 2024-06-28 ENCOUNTER — Inpatient Hospital Stay (HOSPITAL_COMMUNITY)

## 2024-06-28 DIAGNOSIS — K651 Peritoneal abscess: Secondary | ICD-10-CM | POA: Diagnosis not present

## 2024-06-28 LAB — COMPREHENSIVE METABOLIC PANEL WITH GFR
ALT: <5 U/L (ref 0–44)
AST: 58 U/L — ABNORMAL HIGH (ref 15–41)
Albumin: 3 g/dL — ABNORMAL LOW (ref 3.5–5.0)
Alkaline Phosphatase: 133 U/L — ABNORMAL HIGH (ref 38–126)
Anion gap: 8 (ref 5–15)
BUN: 13 mg/dL (ref 6–20)
CO2: 25 mmol/L (ref 22–32)
Calcium: 8.5 mg/dL — ABNORMAL LOW (ref 8.9–10.3)
Chloride: 95 mmol/L — ABNORMAL LOW (ref 98–111)
Creatinine, Ser: 0.72 mg/dL (ref 0.61–1.24)
GFR, Estimated: >60 mL/min
Glucose, Bld: 146 mg/dL — ABNORMAL HIGH (ref 70–99)
Potassium: 4.8 mmol/L (ref 3.5–5.1)
Sodium: 128 mmol/L — ABNORMAL LOW (ref 135–145)
Total Bilirubin: 19.3 mg/dL (ref 0.0–1.2)
Total Protein: 6.3 g/dL — ABNORMAL LOW (ref 6.5–8.1)

## 2024-06-28 LAB — GLUCOSE, BODY FLUID OTHER
Glucose, Body Fluid Other: 92 mg/dL
Source of Sample: 19497

## 2024-06-28 LAB — PROTEIN, BODY FLUID (OTHER)
Source of Sample: 19588
Total Protein, Body Fluid Other: 4.3 g/dL
Total Protein, Body Fluid Other: 7.6 g/dL

## 2024-06-28 LAB — CBC
HCT: 23.7 % — ABNORMAL LOW (ref 39.0–52.0)
Hemoglobin: 8.5 g/dL — ABNORMAL LOW (ref 13.0–17.0)
MCH: 35.7 pg — ABNORMAL HIGH (ref 26.0–34.0)
MCHC: 35.9 g/dL (ref 30.0–36.0)
MCV: 99.6 fL (ref 80.0–100.0)
Platelets: 90 10*3/uL — ABNORMAL LOW (ref 150–400)
RBC: 2.38 MIL/uL — ABNORMAL LOW (ref 4.22–5.81)
RDW: 20.6 % — ABNORMAL HIGH (ref 11.5–15.5)
WBC: 20.6 10*3/uL — ABNORMAL HIGH (ref 4.0–10.5)
nRBC: 0 % (ref 0.0–0.2)

## 2024-06-28 LAB — LACTATE DEHYDROGENASE: LDH: 324 U/L — ABNORMAL HIGH (ref 105–235)

## 2024-06-28 NOTE — Progress Notes (Signed)
" °   06/28/24 9268  Provider Notification  Provider Name/Title Rojelio Nest OD  Date Provider Notified 06/28/24  Time Provider Notified [phone]  Method of Notification [facility])  Notification Reason Critical Result (Bilirubin 19.3)  Provider response No new orders  Date of Provider Response 06/28/24  Time of Provider Response 0732    "

## 2024-06-28 NOTE — Plan of Care (Signed)

## 2024-06-28 NOTE — TOC Initial Note (Signed)
 Transition of Care [facility]) - Initial/Assessment Note    Patient Details  Name: Jeffrey Mathis MRN: [id number] Date of Birth: Oct 08, 1980  Transition of Care [facility]) CM/SW Contact:    Sonda Manuella Quill, RN Phone Number: 06/28/2024, 5:20 PM  Clinical Narrative:                 No answer on room phone; called pt on cell phone [phone]); pt said he lives at home w/ his spouse Jeffrey Mathis [phone]); he plans to return w/ her support at d/c; she will provide transportation; insurance/PCP verified; he denied SDOH risks; pt has cane and walker; pt said he attends OPPT at [employer]; he does not have HH services or home oxygen; IP CM is following.  Expected Discharge Plan: Home/Self Care Barriers to Discharge: Continued Medical Work up   Patient Goals and CMS Choice Patient states their goals for this hospitalization and ongoing recovery are:: home          Expected Discharge Plan and Services   Discharge Planning Services: CM Consult   Living arrangements for the past 2 months: Single Family Home                 DME Arranged: N/A DME Agency: NA       HH Arranged: NA HH Agency: NA        Prior Living Arrangements/Services Living arrangements for the past 2 months: Single Family Home Lives with:: Spouse Patient language and need for interpreter reviewed:: Yes Do you feel safe going back to the place where you live?: Yes      Need for Family Participation in Patient Care: Yes (Comment)   Current home services: DME (cane, walker) Criminal Activity/Legal Involvement Pertinent to Current Situation/Hospitalization: No - Comment as needed  Activities of Daily Living   ADL Screening (condition at time of admission) Independently performs ADLs?: Yes (appropriate for developmental age) Is the patient deaf or have difficulty hearing?: No Does the patient have difficulty seeing, even when wearing glasses/contacts?: No Does the patient have difficulty concentrating,  remembering, or making decisions?: No  Permission Sought/Granted Permission sought to share information with : Case Manager Permission granted to share information with : Yes, Verbal Permission Granted  Share Information with NAME: Case Manager     Permission granted to share info w Relationship: Creedence Kunesh (spouse) [phone]     Emotional Assessment Appearance:: Other (Comment Required (unable to assess) Attitude/Demeanor/Rapport: Gracious Affect (typically observed): Accepting Orientation: : Oriented to Self, Oriented to Place, Oriented to  Time, Oriented to Situation Alcohol / Substance Use: Not Applicable Psych Involvement: No (comment)  Admission diagnosis:  Splenomegaly [R16.1] Hyperbilirubinemia [E80.6] Pleural effusion [J90] Intra-abdominal abscess (HCC) [K65.1] Alcoholic cirrhosis of liver with ascites (HCC) [K70.31] Constipation, unspecified constipation type [K59.00] Leukocytosis, unspecified type [D72.829] Esophageal varices without bleeding, unspecified esophageal varices type (HCC) [I85.00] Community acquired pneumonia, unspecified laterality [J18.9] Sepsis, due to unspecified organism, unspecified whether acute organ dysfunction present [facility]) [A41.9] Patient Active Problem List   Diagnosis Date Noted   Intra-abdominal abscess (HCC) 06/25/2024   Counseling and coordination of care 06/17/2024   Goals of care, counseling/discussion 06/17/2024   DNR (do not resuscitate) discussion 06/17/2024   Palliative care encounter 06/17/2024   Need for emotional support 06/17/2024   Coagulopathy 06/14/2024   Alcoholic cirrhosis of liver with ascites (HCC) 06/14/2024   Thrombocytopenia 06/14/2024   Hyperammonemia 06/14/2024   Pleural effusion 06/14/2024   Hypoxic respiratory failure (HCC) 06/14/2024   Anemia 06/14/2024   Prolonged  QT interval 06/14/2024   Acute pancreatitis 04/30/2023   Transaminitis 04/30/2023   Hyperbilirubinemia 04/30/2023   Chronic alcoholic  pancreatitis (HCC) 04/30/2023   Esophageal varices (HCC) 04/30/2023   Portal hypertension (HCC) 04/30/2023   Alcohol use disorder, severe, dependence (HCC) 06/07/2022   Sinus bradycardia 05/17/2021   Tobacco use 12/15/2020   Leukocytosis 12/12/2017   PCP:  [facility] Pharmacy:   [id number] - [place], [place] - [address] AT [facility] [address] [place] [address] Phone: [phone] Fax: [phone]  [facility] [address] [place] [address] Phone: [phone] Fax: [phone]     Social Drivers of Health (SDOH) Social History: SDOH Screenings   Food Insecurity: No Food Insecurity (06/28/2024)  Housing: Low Risk (06/28/2024)  Recent Concern: Housing - High Risk (06/14/2024)  Transportation Needs: No Transportation Needs (06/28/2024)  Utilities: Not At Risk (06/28/2024)  Tobacco Use: High Risk (06/25/2024)   SDOH Interventions: Food Insecurity Interventions: Intervention Not Indicated, Inpatient TOC Housing Interventions: Intervention Not Indicated, Inpatient TOC Transportation Interventions: Intervention Not Indicated, Inpatient TOC Utilities Interventions: Intervention Not Indicated, Inpatient TOC   Readmission Risk Interventions    06/28/2024    5:16 PM 05/02/2023    9:51 AM  Readmission Risk Prevention Plan  Post Dischage Appt  Complete  Medication Screening  Complete  Transportation Screening Complete Complete  PCP or Specialist Appt within 3-5 Days Complete   HRI or Home Care Consult Complete   Social Work Consult for Recovery Care Planning/Counseling Complete   Palliative Care Screening Not Applicable   Medication Review Oceanographer) Complete

## 2024-06-28 NOTE — Progress Notes (Signed)
 " PROGRESS NOTE    Jeffrey Mathis  FMW:[id number] DOB: 05/23/1981 DOA: 06/25/2024 PCP: [facility]     Brief Narrative:  Jeffrey Mathis is a 44 y.o. male with medical history significant for liver cirrhosis, GERD, history of alcohol abuse (none since December 2018), recurrent pancreatitis, who presents to the ER due to abnormal lab results.  The patient went to his primary care provider at the [facility] for a post hospital follow up appointment.  The patient was recently admitted from 06/14/2024 through 06/19/2024 for large left pleural effusion, hepatic hydrothorax/hemothorax with acute hypoxic respiratory failure, and E. coli bacteremia.  Outpatient blood work returned today with leukocytosis 34,000.  The patient was advised to go to the ER for further evaluation.  He presented to the ER at [facility] accompanied by his wife.   CT abdomen pelvis with contrast revealed moderate left-sided pleural effusion, decreased compared to prior, resolved right pleural effusion.  Improved aeration of the left upper lobe.  Liver cirrhosis with evidence of portal hypertension.  Large gastroesophageal varices and splenomegaly.  Decreased small volume ascites compared to prior.  Interim finding of long enhancing fluid collection along the right anterior pararenal space and the right colic gutter this is suspicious for developing abscess.    IR consulted, patient underwent thoracentesis for left-sided pleural effusion.    New events last 24 hours / Subjective: Still having some discomfort in his left back over the site of his thoracentesis.  Muscle relaxer and pain medication has helped.  WBC down to 20.6 today.  Remains afebrile  Assessment & Plan:    Principal Problem:   Intra-abdominal abscess (HCC) Active Problems:   Leukocytosis   Hyperbilirubinemia   Esophageal varices (HCC)   Portal hypertension (HCC)   Coagulopathy   Alcoholic cirrhosis of liver with ascites (HCC)    Thrombocytopenia   Pleural effusion   Possible developing intra-abdominal abscess with leukocytosis - Was recently admitted for E. coli bacteremia, completed cefadroxil  and Flagyl  - Discussed with IR, they reviewed imaging, there is no discernible collection or abscess amenable to drain placement. - Continue Zosyn  and monitor WBC - Leukocytosis improving  End-stage liver disease, alcoholic cirrhosis with ascites, portal hypertension, gastroesophageal varices, splenomegaly, jaundice, coagulopathy, thrombocytopenia - Continue Lasix , spironolactone , lactulose , Xifaxan   Moderate left-sided pleural effusion - Status post thoracentesis 1/24   DVT prophylaxis:  SCDs Start: 06/25/24 2316  Code Status: Full code Family Communication: None at bedside Disposition Plan: Home Status is: Inpatient Remains inpatient appropriate because: IV antibiotics    Antimicrobials:  Anti-infectives (From admission, onward)    Start     Dose/Rate Route Frequency Ordered Stop   06/26/24 1000  vancomycin  (VANCOCIN ) IVPB 1000 mg/200 mL premix  Status:  Discontinued        1,000 mg 200 mL/hr over 60 Minutes Intravenous Every 12 hours 06/25/24 2327 06/26/24 0026   06/26/24 1000  linezolid  (ZYVOX ) IVPB 600 mg  Status:  Discontinued        600 mg 300 mL/hr over 60 Minutes Intravenous Every 12 hours 06/26/24 0026 06/26/24 1202   06/26/24 0800  rifaximin  (XIFAXAN ) tablet 550 mg        550 mg Oral 2 times daily 06/25/24 2330     06/26/24 0400  piperacillin -tazobactam (ZOSYN ) IVPB 3.375 g        3.375 g 12.5 mL/hr over 240 Minutes Intravenous Every 8 hours 06/26/24 0023     06/25/24 2245  vancomycin  (VANCOREADY) IVPB 1250 mg/250 mL  1,250 mg 166.7 mL/hr over 90 Minutes Intravenous  Once 06/25/24 2239 06/26/24 0031   06/25/24 2100  piperacillin -tazobactam (ZOSYN ) IVPB 3.375 g        3.375 g 100 mL/hr over 30 Minutes Intravenous  Once 06/25/24 2051 06/25/24 2130        Objective: Vitals:    06/27/24 1300 06/27/24 1436 06/27/24 2000 06/28/24 0604  BP: 106/65 118/68 (!) 105/53 120/71  Pulse:  97 88 100  Resp:  18  19  Temp:  98.2 F (36.8 C) 97.9 F (36.6 C) 98.1 F (36.7 C)  TempSrc:   Oral Oral  SpO2:  99% 99% 97%  Weight:      Height:        Intake/Output Summary (Last 24 hours) at 06/28/2024 1016 Last data filed at 06/28/2024 9666 Gross per 24 hour  Intake 365.6 ml  Output --  Net 365.6 ml   Filed Weights   06/25/24 1924  Weight: 62.6 kg    Examination:  General exam: Appears calm and comfortable, jaundiced with scleral icterus, fatigued Respiratory system:  Respiratory effort normal. No respiratory distress. No conversational dyspnea.  Cardiovascular system: S1 & S2 heard, RRR. No murmurs. No pedal edema. Gastrointestinal system: Abdomen is nondistended, soft and nontender.  Central nervous system: Alert and oriented. No focal neurological deficits. Speech clear.  Extremities: Symmetric in appearance  Psychiatry: Judgement and insight appear normal. Mood & affect appropriate.   Data Reviewed: I have personally reviewed following labs and imaging studies  CBC: Recent Labs  Lab 06/25/24 1959 06/26/24 0430 06/27/24 0531 06/28/24 0553  WBC 28.5* 25.3* 22.5* 20.6*  NEUTROABS 21.9*  --   --   --   HGB 9.4* 8.2* 8.7* 8.5*  HCT 26.9* 24.5* 24.4* 23.7*  MCV 101.5* 102.9* 100.0 99.6  PLT 140* 119* 111* 90*   Basic Metabolic Panel: Recent Labs  Lab 06/25/24 1959 06/26/24 0430 06/27/24 0531 06/28/24 0553  NA 129* 133* 130* 128*  K 4.9 4.3 4.9 4.8  CL 96* 103 98 95*  CO2 23 21* 22 25  GLUCOSE 155* 125* 104* 146*  BUN 13 11 12 13   CREATININE 0.69 0.55* 0.68 0.72  CALCIUM 8.9 7.8* 8.9 8.5*  MG  --  1.7  --   --   PHOS  --  2.4*  --   --    GFR: Estimated Creatinine Clearance: 107.6 mL/min (by C-G formula based on SCr of 0.72 mg/dL). Liver Function Tests: Recent Labs  Lab 06/25/24 1959 06/26/24 0430 06/27/24 0531 06/28/24 0553  AST 50* 36  64* 58*  ALT RESULTS UNAVAILABLE DUE TO INTERFERING SUBSTANCE <5 13 <5  ALKPHOS 143* 137* 130* 133*  BILITOT 21.1* 16.8* 18.7* 19.3*  PROT 7.0 5.8* 6.2* 6.3*  ALBUMIN  3.4* 2.7* 3.0* 3.0*   Recent Labs  Lab 06/25/24 2042  LIPASE 107*   Recent Labs  Lab 06/25/24 2049  AMMONIA 23   Coagulation Profile: Recent Labs  Lab 06/25/24 2042  INR 2.8*   Cardiac Enzymes: No results for input(s): CKTOTAL, CKMB, CKMBINDEX, TROPONINI in the last 168 hours. BNP (last 3 results) No results for input(s): PROBNP in the last 8760 hours. HbA1C: No results for input(s): HGBA1C in the last 72 hours. CBG: No results for input(s): GLUCAP in the last 168 hours. Lipid Profile: No results for input(s): CHOL, HDL, LDLCALC, TRIG, CHOLHDL, LDLDIRECT in the last 72 hours. Thyroid Function Tests: No results for input(s): TSH, T4TOTAL, FREET4, T3FREE, THYROIDAB in the last 72 hours. Anemia  Panel: No results for input(s): VITAMINB12, FOLATE, FERRITIN, TIBC, IRON, RETICCTPCT in the last 72 hours. Sepsis Labs: Recent Labs  Lab 06/25/24 2306 06/25/24 2345  LATICACIDVEN 1.2 1.2    Recent Results (from the past 240 hours)  Blood culture (routine x 2)     Status: None (Preliminary result)   Collection Time: 06/25/24  8:42 PM   Specimen: BLOOD  Result Value Ref Range Status   Specimen Description   Final    BLOOD LEFT ANTECUBITAL Performed at [facility], [address] [place], [place] [address]    Special Requests   Final    BOTTLES DRAWN AEROBIC AND ANAEROBIC Blood Culture results may not be optimal due to an inadequate volume of blood received in culture bottles Performed at [facility], [address] [place], [place] [address]    Culture   Final    NO GROWTH 1 DAY Performed at [facility], [address] [place], [place] [address]    Report Status PENDING  Incomplete  Blood culture (routine x  2)     Status: None (Preliminary result)   Collection Time: 06/25/24  8:55 PM   Specimen: BLOOD  Result Value Ref Range Status   Specimen Description   Final    BLOOD RIGHT ANTECUBITAL Performed at [facility], [address], [place], [place] [address]    Special Requests   Final    BOTTLES DRAWN AEROBIC AND ANAEROBIC Blood Culture results may not be optimal due to an inadequate volume of blood received in culture bottles Performed at [facility], [address], [place], [place] [address]    Culture   Final    NO GROWTH 1 DAY Performed at [facility], [address] [place], [place] [address]    Report Status PENDING  Incomplete  Resp panel by RT-PCR (RSV, Flu A&B, Covid) Anterior Nasal Swab     Status: None   Collection Time: 06/25/24  9:06 PM   Specimen: Anterior Nasal Swab  Result Value Ref Range Status   SARS Coronavirus 2 by RT PCR NEGATIVE NEGATIVE Final    Comment: (NOTE) SARS-CoV-2 target nucleic acids are NOT DETECTED.  The SARS-CoV-2 RNA is generally detectable in upper respiratory specimens during the acute phase of infection. The lowest concentration of SARS-CoV-2 viral copies this assay can detect is 138 copies/mL. A negative result does not preclude SARS-Cov-2 infection and should not be used as the sole basis for treatment or other patient management decisions. A negative result may occur with  improper specimen collection/handling, submission of specimen other than nasopharyngeal swab, presence of viral mutation(s) within the areas targeted by this assay, and inadequate number of viral copies(<138 copies/mL). A negative result must be combined with clinical observations, patient history, and epidemiological information. The expected result is Negative.  Fact Sheet for Patients:  bloggercourse.com  Fact Sheet for Healthcare Providers:  seriousbroker.it  This  test is no t yet approved or cleared by the [place]  FDA and  has been authorized for detection and/or diagnosis of SARS-CoV-2 by FDA under an Emergency Use Authorization (EUA). This EUA will remain  in effect (meaning this test can be used) for the duration of the COVID-19 declaration under Section 564(b)(1) of the Act, 21 U.S.C.section 360bbb-3(b)(1), unless the authorization is terminated  or revoked sooner.       Influenza A by PCR NEGATIVE NEGATIVE Final   Influenza B by PCR NEGATIVE NEGATIVE  Final    Comment: (NOTE) The Xpert Xpress SARS-CoV-2/FLU/RSV plus assay is intended as an aid in the diagnosis of influenza from Nasopharyngeal swab specimens and should not be used as a sole basis for treatment. Nasal washings and aspirates are unacceptable for Xpert Xpress SARS-CoV-2/FLU/RSV testing.  Fact Sheet for Patients: bloggercourse.com  Fact Sheet for Healthcare Providers: seriousbroker.it  This test is not yet approved or cleared by the [place]  FDA and has been authorized for detection and/or diagnosis of SARS-CoV-2 by FDA under an Emergency Use Authorization (EUA). This EUA will remain in effect (meaning this test can be used) for the duration of the COVID-19 declaration under Section 564(b)(1) of the Act, 21 U.S.C. section 360bbb-3(b)(1), unless the authorization is terminated or revoked.     Resp Syncytial Virus by PCR NEGATIVE NEGATIVE Final    Comment: (NOTE) Fact Sheet for Patients: bloggercourse.com  Fact Sheet for Healthcare Providers: seriousbroker.it  This test is not yet approved or cleared by the [place]  FDA and has been authorized for detection and/or diagnosis of SARS-CoV-2 by FDA under an Emergency Use Authorization (EUA). This EUA will remain in effect (meaning this test can be used) for the duration of the COVID-19 declaration under  Section 564(b)(1) of the Act, 21 U.S.C. section 360bbb-3(b)(1), unless the authorization is terminated or revoked.  Performed at [facility], [address] [place], [place] [address]   MRSA Next Gen by PCR, Nasal     Status: None   Collection Time: 06/26/24  4:30 AM   Specimen: Nasal Mucosa; Nasal Swab  Result Value Ref Range Status   MRSA by PCR Next Gen NOT DETECTED NOT DETECTED Final    Comment: (NOTE) The GeneXpert MRSA Assay (FDA approved for NASAL specimens only), is one component of a comprehensive MRSA colonization surveillance program. It is not intended to diagnose MRSA infection nor to guide or monitor treatment for MRSA infections. Test performance is not FDA approved in patients less than 92 years old. Performed at [facility], [address], [place], [place] [address]   Body fluid culture w Gram Stain     Status: None (Preliminary result)   Collection Time: 06/27/24 12:00 PM   Specimen: PATH Cytology Pleural fluid  Result Value Ref Range Status   Specimen Description   Final    PLEURAL Performed at [facility], [address], [place], [place] [address]    Special Requests   Final    NONE Performed at [facility], [address] [place], [place] [address]    Gram Stain   Final    NO WBC SEEN NO ORGANISMS SEEN Performed at [facility], [address] [place], [place] [address]    Culture PENDING  Incomplete   Report Status PENDING  Incomplete      Radiology Studies: DG CHEST PORT 1 VIEW Result Date: 06/28/2024 CLINICAL DATA:  Left pleural effusion. EXAM: PORTABLE CHEST 1 VIEW COMPARISON:  06/27/2024 FINDINGS: Subtle atelectasis at the left base with small left pleural effusion is similar to prior. Right lung remains clear. Cardiopericardial silhouette is at upper limits of normal for size. Telemetry leads overlie the chest. IMPRESSION: Similar appearance of small left  pleural effusion with left base atelectasis. Electronically Signed   By: Camellia Candle M.D.   On: 06/28/2024 08:30   DG Chest Port 1 View Result Date: 06/27/2024 CLINICAL DATA:  Status post thoracentesis, cirrhotic, shortness of breath EXAM: PORTABLE CHEST 1  VIEW COMPARISON:  06/25/2024 FINDINGS: Improvement in the left effusion following thoracentesis. Small residual left effusion remains with left basilar atelectasis. Right lung remains clear. No pneumothorax or complicating feature by plain radiography. Stable heart size and vascularity. Similar scoliosis of the spine. IMPRESSION: Improvement in the left effusion following thoracentesis. No pneumothorax. Electronically Signed   By: CHRISTELLA.  Shick M.D.   On: 06/27/2024 14:42   US  THORACENTESIS LEFT ASP PLEURAL SPACE W/IMG GUIDE Result Date: 06/27/2024 INDICATION: 44 year old male with a history of end-stage alcoholic cirrhosis currently admitted for intra-abdominal abscess. Imaging also notable for recurrent, large left-sided pleural effusion. Request for diagnostic and therapeutic thoracentesis. EXAM: ULTRASOUND GUIDED DIAGNOSTIC AND THERAPEUTIC, LEFT-SIDED THORACENTESIS MEDICATIONS: 1% lidocaine , 7 mL. COMPLICATIONS: None immediate. PROCEDURE: An ultrasound guided thoracentesis was thoroughly discussed with the patient and questions answered. The benefits, risks, alternatives and complications were also discussed. The patient understands and wishes to proceed with the procedure. Written consent was obtained. Ultrasound was performed to localize and mark an adequate pocket of fluid in the left chest. The area was then prepped and draped in the normal sterile fashion. 1% Lidocaine  was used for local anesthesia. Under ultrasound guidance a 6 Fr Safe-T-Centesis catheter was introduced. Thoracentesis was performed. The catheter was removed and a dressing applied. FINDINGS: A total of approximately 1.3 L of bloody fluid was removed. Samples were sent to the  laboratory as requested by the clinical team. IMPRESSION: Successful ultrasound guided left-sided thoracentesis yielding 1.3 L of bloody pleural fluid. Procedure performed by: Sherrilee Bal, PA-C under the supervision of Dr. ONEIDA Specking Electronically Signed   By: CHRISTELLA.  Shick M.D.   On: 06/27/2024 13:45      Scheduled Meds:  folic acid   1 mg Oral Daily   furosemide   40 mg Oral Daily   lactulose   20 g Oral BID   multivitamin with minerals  1 tablet Oral Daily   pantoprazole   40 mg Oral QHS   potassium chloride  SA  20 mEq Oral Daily   rifaximin   550 mg Oral BID   spironolactone   100 mg Oral Daily   thiamine   100 mg Oral Daily   Continuous Infusions:  piperacillin -tazobactam 3.375 g (06/28/24 0333)     LOS: 3 days   Time spent: 25 minutes   Delon Hoe, DO [facility] 06/28/2024, 10:16 AM   Available via Epic secure chat 7am-7pm After these hours, please refer to coverage provider listed on [email or web address]  "

## 2024-06-29 DIAGNOSIS — E871 Hypo-osmolality and hyponatremia: Secondary | ICD-10-CM | POA: Diagnosis present

## 2024-06-29 LAB — CBC
HCT: 24.4 % — ABNORMAL LOW (ref 39.0–52.0)
Hemoglobin: 8.3 g/dL — ABNORMAL LOW (ref 13.0–17.0)
MCH: 35.2 pg — ABNORMAL HIGH (ref 26.0–34.0)
MCHC: 34 g/dL (ref 30.0–36.0)
MCV: 103.4 fL — ABNORMAL HIGH (ref 80.0–100.0)
Platelets: 82 10*3/uL — ABNORMAL LOW (ref 150–400)
RBC: 2.36 MIL/uL — ABNORMAL LOW (ref 4.22–5.81)
RDW: 19.9 % — ABNORMAL HIGH (ref 11.5–15.5)
WBC: 23.2 10*3/uL — ABNORMAL HIGH (ref 4.0–10.5)
nRBC: 0 % (ref 0.0–0.2)

## 2024-06-29 LAB — COMPREHENSIVE METABOLIC PANEL WITH GFR
ALT: 13 U/L (ref 0–44)
AST: 74 U/L — ABNORMAL HIGH (ref 15–41)
Albumin: 3 g/dL — ABNORMAL LOW (ref 3.5–5.0)
Alkaline Phosphatase: 144 U/L — ABNORMAL HIGH (ref 38–126)
Anion gap: 10 (ref 5–15)
BUN: 17 mg/dL (ref 6–20)
CO2: 23 mmol/L (ref 22–32)
Calcium: 8.4 mg/dL — ABNORMAL LOW (ref 8.9–10.3)
Chloride: 93 mmol/L — ABNORMAL LOW (ref 98–111)
Creatinine, Ser: 0.78 mg/dL (ref 0.61–1.24)
GFR, Estimated: >60 mL/min
Glucose, Bld: 132 mg/dL — ABNORMAL HIGH (ref 70–99)
Potassium: 4.9 mmol/L (ref 3.5–5.1)
Sodium: 126 mmol/L — ABNORMAL LOW (ref 135–145)
Total Bilirubin: 20.3 mg/dL (ref 0.0–1.2)
Total Protein: 6.3 g/dL — ABNORMAL LOW (ref 6.5–8.1)

## 2024-06-29 LAB — MISC LABCORP TEST (SEND OUT)
LabCorp test name: 11254
Labcorp test code: 11254

## 2024-06-29 LAB — PATHOLOGIST SMEAR REVIEW

## 2024-06-29 MED ORDER — ORAL CARE MOUTH RINSE: 15.0000 mL | OROMUCOSAL | Status: DC | PRN

## 2024-06-29 MED ORDER — LACTATED RINGERS IV BOLUS
500.0000 mL | Freq: Once | INTRAVENOUS | Status: AC
  Administered 2024-06-29: 500 mL via INTRAVENOUS

## 2024-06-29 NOTE — Plan of Care (Signed)

## 2024-06-29 NOTE — Progress Notes (Addendum)
 " PROGRESS NOTE    Tabius Rood  FMW:[id number] DOB: 11/19/1980 DOA: 06/25/2024 PCP: [facility]     Brief Narrative:  Jeffrey Mathis is a 44 y.o. male with medical history significant for liver cirrhosis, GERD, history of alcohol abuse (none since December 2018), recurrent pancreatitis, who presents to the ER due to abnormal lab results.  The patient went to his primary care provider at the [facility] for a post hospital follow up appointment.  The patient was recently admitted from 06/14/2024 through 06/19/2024 for large left pleural effusion, hepatic hydrothorax/hemothorax with acute hypoxic respiratory failure, and E. coli bacteremia.  Outpatient blood work returned today with leukocytosis 34,000.  The patient was advised to go to the ER for further evaluation.  He presented to the ER at [facility] accompanied by his wife.    CT abdomen pelvis with contrast revealed moderate left-sided pleural effusion, decreased compared to prior, resolved right pleural effusion.  Improved aeration of the left upper lobe.  Liver cirrhosis with evidence of portal hypertension.  Large gastroesophageal varices and splenomegaly.  Decreased small volume ascites compared to prior.  Interim finding of long enhancing fluid collection along the right anterior pararenal space and the right colic gutter this is suspicious for developing abscess.     IR consulted, patient underwent thoracentesis for left-sided pleural effusion.  New events last 24 hours / Subjective: Patient reports feeling a little bit better today.  Still a little sore over his left thoracic region where he had his thoracentesis.  No new nausea or vomiting, pain.  Denies fevers.  Assessment & Plan:   Principal Problem:   Intra-abdominal abscess (HCC) Active Problems:   Leukocytosis   Hyperbilirubinemia   Esophageal varices (HCC)   Portal hypertension (HCC)   Coagulopathy   Alcoholic cirrhosis of liver with ascites  (HCC)   Thrombocytopenia   Pleural effusion   Possible developing intra-abdominal abscess with leukocytosis - Was recently admitted for E. coli bacteremia, completed cefadroxil  and Flagyl  - Discussed with IR, they reviewed imaging, there is no discernible collection or abscess amenable to drain placement. - Continue Zosyn  and monitor WBC - Leukocytosis was improving, a bit higher today though clinically improving   End-stage liver disease, alcoholic cirrhosis with ascites, portal hypertension, gastroesophageal varices, splenomegaly, jaundice, coagulopathy, thrombocytopenia - Continue Lasix , spironolactone , lactulose , Xifaxan    Moderate left-sided pleural effusion - Status post thoracentesis 1/24  Hyponatremia -Monitor BMP - Will give a little fluid after reviewing I/O's, only 692 mL in last 24 hrs.     DVT prophylaxis:  SCDs Start: 06/25/24 2316 Code Status: Full code Family Communication: spoke w wife, Bernarda, on phone Disposition Plan: Home Status is: Inpatient Remains inpatient appropriate because: IV antibiotics    Antimicrobials:  Anti-infectives (From admission, onward)    Start     Dose/Rate Route Frequency Ordered Stop   06/26/24 1000  vancomycin  (VANCOCIN ) IVPB 1000 mg/200 mL premix  Status:  Discontinued        1,000 mg 200 mL/hr over 60 Minutes Intravenous Every 12 hours 06/25/24 2327 06/26/24 0026   06/26/24 1000  linezolid  (ZYVOX ) IVPB 600 mg  Status:  Discontinued        600 mg 300 mL/hr over 60 Minutes Intravenous Every 12 hours 06/26/24 0026 06/26/24 1202   06/26/24 0800  rifaximin  (XIFAXAN ) tablet 550 mg        550 mg Oral 2 times daily 06/25/24 2330     06/26/24 0400  piperacillin -tazobactam (ZOSYN ) IVPB 3.375 g  3.375 g 12.5 mL/hr over 240 Minutes Intravenous Every 8 hours 06/26/24 0023     06/25/24 2245  vancomycin  (VANCOREADY) IVPB 1250 mg/250 mL        1,250 mg 166.7 mL/hr over 90 Minutes Intravenous  Once 06/25/24 2239 06/26/24 0031    06/25/24 2100  piperacillin -tazobactam (ZOSYN ) IVPB 3.375 g        3.375 g 100 mL/hr over 30 Minutes Intravenous  Once 06/25/24 2051 06/25/24 2130        Objective: Vitals:   06/28/24 0604 06/28/24 1859 06/29/24 0506 06/29/24 1316  BP: 120/71 (!) 111/58 (!) 105/57 112/60  Pulse: 100 85 93 (!) 101  Resp: 19 15 18 17   Temp: 98.1 F (36.7 C) 98.4 F (36.9 C) 98.2 F (36.8 C) 97.9 F (36.6 C)  TempSrc: Oral     SpO2: 97%   97%  Weight:      Height:        Intake/Output Summary (Last 24 hours) at 06/29/2024 1434 Last data filed at 06/29/2024 1330 Gross per 24 hour  Intake 692 ml  Output 500 ml  Net 192 ml   Filed Weights   06/25/24 1924  Weight: 62.6 kg    Examination:  General exam: Appears calm and comfortable  Respiratory system: Clear to auscultation- decreased sounds in R lower lung field. Respiratory effort normal. No respiratory distress. No conversational dyspnea.  Cardiovascular system: S1 & S2 heard, RRR.  Eyes: +SI Gastrointestinal system: Abdomen is nondistended, soft and nontender. Normal bowel sounds heard. Central nervous system: Alert and oriented. No focal neurological deficits. Speech clear.  Extremities: Symmetric in appearance  Skin: +diffuse jaundice Psychiatry: Judgement and insight appear normal. Mood & affect appropriate.   Data Reviewed: I have personally reviewed following labs and imaging studies  CBC: Recent Labs  Lab 06/25/24 1959 06/26/24 0430 06/27/24 0531 06/28/24 0553 06/29/24 0503  WBC 28.5* 25.3* 22.5* 20.6* 23.2*  NEUTROABS 21.9*  --   --   --   --   HGB 9.4* 8.2* 8.7* 8.5* 8.3*  HCT 26.9* 24.5* 24.4* 23.7* 24.4*  MCV 101.5* 102.9* 100.0 99.6 103.4*  PLT 140* 119* 111* 90* 82*   Basic Metabolic Panel: Recent Labs  Lab 06/25/24 1959 06/26/24 0430 06/27/24 0531 06/28/24 0553 06/29/24 0503  NA 129* 133* 130* 128* 126*  K 4.9 4.3 4.9 4.8 4.9  CL 96* 103 98 95* 93*  CO2 23 21* 22 25 23   GLUCOSE 155* 125* 104* 146*  132*  BUN 13 11 12 13 17   CREATININE 0.69 0.55* 0.68 0.72 0.78  CALCIUM 8.9 7.8* 8.9 8.5* 8.4*  MG  --  1.7  --   --   --   PHOS  --  2.4*  --   --   --    GFR: Estimated Creatinine Clearance: 107.6 mL/min (by C-G formula based on SCr of 0.78 mg/dL).  Liver Function Tests: Recent Labs  Lab 06/25/24 1959 06/26/24 0430 06/27/24 0531 06/28/24 0553 06/29/24 0503  AST 50* 36 64* 58* 74*  ALT RESULTS UNAVAILABLE DUE TO INTERFERING SUBSTANCE <5 13 <5 13  ALKPHOS 143* 137* 130* 133* 144*  BILITOT 21.1* 16.8* 18.7* 19.3* 20.3*  PROT 7.0 5.8* 6.2* 6.3* 6.3*  ALBUMIN  3.4* 2.7* 3.0* 3.0* 3.0*   Recent Labs  Lab 06/25/24 2042  LIPASE 107*   Recent Labs  Lab 06/25/24 2049  AMMONIA 23   Coagulation Profile: Recent Labs  Lab 06/25/24 2042  INR 2.8*   Sepsis Labs: Recent  Labs  Lab 06/25/24 2306 06/25/24 2345  LATICACIDVEN 1.2 1.2    Recent Results (from the past 240 hours)  Blood culture (routine x 2)     Status: None (Preliminary result)   Collection Time: 06/25/24  8:42 PM   Specimen: BLOOD  Result Value Ref Range Status   Specimen Description   Final    BLOOD LEFT ANTECUBITAL Performed at [facility], [address] [place], [place] [address]    Special Requests   Final    BOTTLES DRAWN AEROBIC AND ANAEROBIC Blood Culture results may not be optimal due to an inadequate volume of blood received in culture bottles Performed at [facility], [address], [place], [place] [address]    Culture   Final    NO GROWTH 2 DAYS Performed at [facility], [address] [place], [place] [address]    Report Status PENDING  Incomplete  Blood culture (routine x 2)     Status: None (Preliminary result)   Collection Time: 06/25/24  8:55 PM   Specimen: BLOOD  Result Value Ref Range Status   Specimen Description   Final    BLOOD RIGHT ANTECUBITAL Performed at [facility], [address], [place], [place]  [address]    Special Requests   Final    BOTTLES DRAWN AEROBIC AND ANAEROBIC Blood Culture results may not be optimal due to an inadequate volume of blood received in culture bottles Performed at [facility], [address] [place], [place] [address]    Culture   Final    NO GROWTH 2 DAYS Performed at [facility], [address], [place], [place] [address]    Report Status PENDING  Incomplete  Resp panel by RT-PCR (RSV, Flu A&B, Covid) Anterior Nasal Swab     Status: None   Collection Time: 06/25/24  9:06 PM   Specimen: Anterior Nasal Swab  Result Value Ref Range Status   SARS Coronavirus 2 by RT PCR NEGATIVE NEGATIVE Final    Comment: (NOTE) SARS-CoV-2 target nucleic acids are NOT DETECTED.  The SARS-CoV-2 RNA is generally detectable in upper respiratory specimens during the acute phase of infection. The lowest concentration of SARS-CoV-2 viral copies this assay can detect is 138 copies/mL. A negative result does not preclude SARS-Cov-2 infection and should not be used as the sole basis for treatment or other patient management decisions. A negative result may occur with  improper specimen collection/handling, submission of specimen other than nasopharyngeal swab, presence of viral mutation(s) within the areas targeted by this assay, and inadequate number of viral copies(<138 copies/mL). A negative result must be combined with clinical observations, patient history, and epidemiological information. The expected result is Negative.  Fact Sheet for Patients:  bloggercourse.com  Fact Sheet for Healthcare Providers:  seriousbroker.it  This test is no t yet approved or cleared by the [place]  FDA and  has been authorized for detection and/or diagnosis of SARS-CoV-2 by FDA under an Emergency Use Authorization (EUA). This EUA will remain  in effect (meaning this test can be used) for the duration of  the COVID-19 declaration under Section 564(b)(1) of the Act, 21 U.S.C.section 360bbb-3(b)(1), unless the authorization is terminated  or revoked sooner.       Influenza A by PCR NEGATIVE NEGATIVE Final   Influenza B by PCR NEGATIVE NEGATIVE Final    Comment: (NOTE) The Xpert Xpress SARS-CoV-2/FLU/RSV plus assay is intended as an aid in the  diagnosis of influenza from Nasopharyngeal swab specimens and should not be used as a sole basis for treatment. Nasal washings and aspirates are unacceptable for Xpert Xpress SARS-CoV-2/FLU/RSV testing.  Fact Sheet for Patients: bloggercourse.com  Fact Sheet for Healthcare Providers: seriousbroker.it  This test is not yet approved or cleared by the [place]  FDA and has been authorized for detection and/or diagnosis of SARS-CoV-2 by FDA under an Emergency Use Authorization (EUA). This EUA will remain in effect (meaning this test can be used) for the duration of the COVID-19 declaration under Section 564(b)(1) of the Act, 21 U.S.C. section 360bbb-3(b)(1), unless the authorization is terminated or revoked.     Resp Syncytial Virus by PCR NEGATIVE NEGATIVE Final    Comment: (NOTE) Fact Sheet for Patients: bloggercourse.com  Fact Sheet for Healthcare Providers: seriousbroker.it  This test is not yet approved or cleared by the [place]  FDA and has been authorized for detection and/or diagnosis of SARS-CoV-2 by FDA under an Emergency Use Authorization (EUA). This EUA will remain in effect (meaning this test can be used) for the duration of the COVID-19 declaration under Section 564(b)(1) of the Act, 21 U.S.C. section 360bbb-3(b)(1), unless the authorization is terminated or revoked.  Performed at [facility], [address], [place], [place] [address]   MRSA Next Gen by PCR, Nasal     Status: None   Collection  Time: 06/26/24  4:30 AM   Specimen: Nasal Mucosa; Nasal Swab  Result Value Ref Range Status   MRSA by PCR Next Gen NOT DETECTED NOT DETECTED Final    Comment: (NOTE) The GeneXpert MRSA Assay (FDA approved for NASAL specimens only), is one component of a comprehensive MRSA colonization surveillance program. It is not intended to diagnose MRSA infection nor to guide or monitor treatment for MRSA infections. Test performance is not FDA approved in patients less than 86 years old. Performed at [facility], [address] [place], [place] [address]   Body fluid culture w Gram Stain     Status: None (Preliminary result)   Collection Time: 06/27/24 12:00 PM   Specimen: PATH Cytology Pleural fluid  Result Value Ref Range Status   Specimen Description   Final    PLEURAL Performed at [facility], [address] [place], [place] [address]    Special Requests   Final    NONE Performed at [facility], [address] [place], [place] [address]    Gram Stain NO WBC SEEN NO ORGANISMS SEEN   Final   Culture   Final    NO GROWTH 2 DAYS Performed at [facility], [address] [place], [place] [address]    Report Status PENDING  Incomplete      Radiology Studies: DG CHEST PORT 1 VIEW Result Date: 06/28/2024 CLINICAL DATA:  Left pleural effusion. EXAM: PORTABLE CHEST 1 VIEW COMPARISON:  06/27/2024 FINDINGS: Subtle atelectasis at the left base with small left pleural effusion is similar to prior. Right lung remains clear. Cardiopericardial silhouette is at upper limits of normal for size. Telemetry leads overlie the chest. IMPRESSION: Similar appearance of small left pleural effusion with left base atelectasis. Electronically Signed   By: Camellia Candle M.D.   On: 06/28/2024 08:30     Scheduled Meds:  folic acid   1 mg Oral Daily   furosemide   40 mg Oral Daily   lactulose   20 g Oral BID   multivitamin with minerals  1  tablet Oral  Daily   pantoprazole   40 mg Oral QHS   potassium chloride  SA  20 mEq Oral Daily   rifaximin   550 mg Oral BID   spironolactone   100 mg Oral Daily   thiamine   100 mg Oral Daily   Continuous Infusions:  piperacillin -tazobactam 3.375 g (06/29/24 1150)     LOS: 4 days    Time spent: 35 minutes   Mabel Deward Pry, DO [facility] 06/29/2024, 2:34 PM   Available via Epic secure chat 7am-7pm After these hours, please refer to coverage provider listed on [email or web address]  "

## 2024-06-29 NOTE — Plan of Care (Signed)
  Problem: Education: Goal: Knowledge of General Education information will improve Description: Including pain rating scale, medication(s)/side effects and non-pharmacologic comfort measures Outcome: Progressing   Problem: Health Behavior/Discharge Planning: Goal: Ability to manage health-related needs will improve Outcome: Progressing   Problem: Activity: Goal: Risk for activity intolerance will decrease Outcome: Progressing   Problem: Nutrition: Goal: Adequate nutrition will be maintained Outcome: Progressing   Problem: Elimination: Goal: Will not experience complications related to bowel motility Outcome: Progressing Goal: Will not experience complications related to urinary retention Outcome: Progressing   Problem: Pain Managment: Goal: General experience of comfort will improve and/or be controlled Outcome: Progressing   Problem: Safety: Goal: Ability to remain free from injury will improve Outcome: Progressing   Problem: Skin Integrity: Goal: Risk for impaired skin integrity will decrease Outcome: Progressing

## 2024-06-30 ENCOUNTER — Inpatient Hospital Stay (HOSPITAL_COMMUNITY)

## 2024-06-30 ENCOUNTER — Encounter (HOSPITAL_COMMUNITY): Payer: Self-pay | Admitting: Internal Medicine

## 2024-06-30 DIAGNOSIS — K651 Peritoneal abscess: Secondary | ICD-10-CM | POA: Diagnosis not present

## 2024-06-30 LAB — CBC
HCT: 22.3 % — ABNORMAL LOW (ref 39.0–52.0)
Hemoglobin: 8 g/dL — ABNORMAL LOW (ref 13.0–17.0)
MCH: 36 pg — ABNORMAL HIGH (ref 26.0–34.0)
MCHC: 35.9 g/dL (ref 30.0–36.0)
MCV: 100.5 fL — ABNORMAL HIGH (ref 80.0–100.0)
Platelets: 79 10*3/uL — ABNORMAL LOW (ref 150–400)
RBC: 2.22 MIL/uL — ABNORMAL LOW (ref 4.22–5.81)
RDW: 20 % — ABNORMAL HIGH (ref 11.5–15.5)
WBC: 22.9 10*3/uL — ABNORMAL HIGH (ref 4.0–10.5)
nRBC: 0 % (ref 0.0–0.2)

## 2024-06-30 LAB — COMPREHENSIVE METABOLIC PANEL WITH GFR
ALT: 17 U/L (ref 0–44)
AST: 83 U/L — ABNORMAL HIGH (ref 15–41)
Albumin: 3 g/dL — ABNORMAL LOW (ref 3.5–5.0)
Alkaline Phosphatase: 144 U/L — ABNORMAL HIGH (ref 38–126)
Anion gap: 7 (ref 5–15)
BUN: 13 mg/dL (ref 6–20)
CO2: 24 mmol/L (ref 22–32)
Calcium: 8.7 mg/dL — ABNORMAL LOW (ref 8.9–10.3)
Chloride: 94 mmol/L — ABNORMAL LOW (ref 98–111)
Creatinine, Ser: 0.71 mg/dL (ref 0.61–1.24)
GFR, Estimated: >60 mL/min
Glucose, Bld: 163 mg/dL — ABNORMAL HIGH (ref 70–99)
Potassium: 4.7 mmol/L (ref 3.5–5.1)
Sodium: 125 mmol/L — ABNORMAL LOW (ref 135–145)
Total Bilirubin: 20.6 mg/dL (ref 0.0–1.2)
Total Protein: 6.3 g/dL — ABNORMAL LOW (ref 6.5–8.1)

## 2024-06-30 MED ORDER — FUROSEMIDE 40 MG PO TABS
40.0000 mg | ORAL_TABLET | Freq: Every day | ORAL | Status: DC
  Administered 2024-07-01 – 2024-07-03 (×3): 40 mg via ORAL
  Filled 2024-06-30 (×3): qty 1

## 2024-06-30 MED ORDER — SPIRONOLACTONE 100 MG PO TABS
100.0000 mg | ORAL_TABLET | Freq: Every day | ORAL | Status: DC
  Administered 2024-07-01 – 2024-07-03 (×3): 100 mg via ORAL
  Filled 2024-06-30 (×3): qty 1

## 2024-06-30 MED ORDER — IOHEXOL 300 MG/ML  SOLN
100.0000 mL | Freq: Once | INTRAMUSCULAR | Status: AC | PRN
  Administered 2024-06-30: 100 mL via INTRAVENOUS

## 2024-06-30 MED ORDER — IOHEXOL 9 MG/ML PO SOLN
500.0000 mL | ORAL | Status: AC
  Administered 2024-06-30 (×2): 500 mL via ORAL

## 2024-06-30 MED ORDER — SODIUM CHLORIDE 0.9 % IV SOLN: INTRAVENOUS | Status: AC

## 2024-06-30 NOTE — Progress Notes (Signed)
 Woodie Sharps, NP from [facility] [phone] ext. [phone] would like a MD to call to have an update about patient.

## 2024-06-30 NOTE — Progress Notes (Signed)
 " PROGRESS NOTE    Jeffrey Mathis  FMW:[id number] DOB: 03-30-1981 DOA: 06/25/2024 PCP: [facility]     Brief Narrative:  Jeffrey Mathis is a 44 y.o. male with medical history significant for liver cirrhosis, GERD, history of alcohol abuse (none since December 2018), recurrent pancreatitis, who presents to the ER due to abnormal lab results.  The patient went to his primary care provider at the [facility] for a post hospital follow up appointment.  The patient was recently admitted from 06/14/2024 through 06/19/2024 for large left pleural effusion, hepatic hydrothorax/hemothorax with acute hypoxic respiratory failure, and E. coli bacteremia.  Outpatient blood work returned today with leukocytosis 34,000.  The patient was advised to go to the ER for further evaluation.  He presented to the ER at [facility] accompanied by his wife.   CT abdomen pelvis with contrast revealed moderate left-sided pleural effusion, decreased compared to prior, resolved right pleural effusion.  Improved aeration of the left upper lobe.  Liver cirrhosis with evidence of portal hypertension.  Large gastroesophageal varices and splenomegaly.  Decreased small volume ascites compared to prior.  Interim finding of long enhancing fluid collection along the right anterior pararenal space and the right colic gutter this is suspicious for developing abscess.    IR consulted, patient underwent thoracentesis for left-sided pleural effusion.    New events last 24 hours / Subjective: No new complaints.  WBC remains elevated 22.9.  Assessment & Plan: Principal Problem:   Intra-abdominal abscess (HCC) Active Problems:   Leukocytosis   Hyperbilirubinemia   Esophageal varices (HCC)   Portal hypertension (HCC)   Coagulopathy   Alcoholic cirrhosis of liver with ascites (HCC)   Thrombocytopenia   Pleural effusion   Hyponatremia   Possible developing intra-abdominal abscess with leukocytosis - Was recently  admitted for E. coli bacteremia, completed cefadroxil  and Flagyl  - Discussed with IR, they reviewed imaging, there is no discernible collection or abscess amenable to drain placement. - Continue Zosyn  and monitor WBC - Leukocytosis initially improved, but trended upward yesterday.  Repeat CT today  End-stage liver disease, alcoholic cirrhosis with ascites, portal hypertension, gastroesophageal varices, splenomegaly, jaundice, coagulopathy, thrombocytopenia - Continue Lasix , spironolactone , lactulose , Xifaxan   Moderate left-sided pleural effusion - Status post thoracentesis 1/24.  Fluid studies exudative.  Gram stain negative  Hyponatremia - Hold Lasix  and spironolactone  today and resume tomorrow.  Give another 500 cc IV fluid today   DVT prophylaxis:  SCDs Start: 06/25/24 2316  Code Status: Full code Family Communication: None at bedside Disposition Plan: Home Status is: Inpatient Remains inpatient appropriate because: IV antibiotics.  Repeat CT today    Antimicrobials:  Anti-infectives (From admission, onward)    Start     Dose/Rate Route Frequency Ordered Stop   06/26/24 1000  vancomycin  (VANCOCIN ) IVPB 1000 mg/200 mL premix  Status:  Discontinued        1,000 mg 200 mL/hr over 60 Minutes Intravenous Every 12 hours 06/25/24 2327 06/26/24 0026   06/26/24 1000  linezolid  (ZYVOX ) IVPB 600 mg  Status:  Discontinued        600 mg 300 mL/hr over 60 Minutes Intravenous Every 12 hours 06/26/24 0026 06/26/24 1202   06/26/24 0800  rifaximin  (XIFAXAN ) tablet 550 mg        550 mg Oral 2 times daily 06/25/24 2330     06/26/24 0400  piperacillin -tazobactam (ZOSYN ) IVPB 3.375 g        3.375 g 12.5 mL/hr over 240 Minutes Intravenous Every 8 hours  06/26/24 0023     06/25/24 2245  vancomycin  (VANCOREADY) IVPB 1250 mg/250 mL        1,250 mg 166.7 mL/hr over 90 Minutes Intravenous  Once 06/25/24 2239 06/26/24 0031   06/25/24 2100  piperacillin -tazobactam (ZOSYN ) IVPB 3.375 g        3.375  g 100 mL/hr over 30 Minutes Intravenous  Once 06/25/24 2051 06/25/24 2130        Objective: Vitals:   06/29/24 2010 06/29/24 2234 06/30/24 0504 06/30/24 1202  BP: (!) 103/58 (!) 113/57 120/62 117/67  Pulse: 94 91 97 81  Resp: 16 20 16 16   Temp: 98.4 F (36.9 C) 99.6 F (37.6 C) 98.3 F (36.8 C) (!) 97.4 F (36.3 C)  TempSrc:  Oral  Oral  SpO2: 98%  96%   Weight:      Height:        Intake/Output Summary (Last 24 hours) at 06/30/2024 1303 Last data filed at 06/30/2024 1024 Gross per 24 hour  Intake 1662 ml  Output --  Net 1662 ml   Filed Weights   06/25/24 1924  Weight: 62.6 kg    Examination:  General exam: Appears calm and comfortable, jaundiced with scleral icterus, fatigued Respiratory system:  Respiratory effort normal. No respiratory distress. No conversational dyspnea.  Cardiovascular system: S1 & S2 heard, RRR. No murmurs. No pedal edema. Gastrointestinal system: Abdomen is nondistended, soft and nontender.  Central nervous system: Alert and oriented. No focal neurological deficits. Speech clear.  Extremities: Symmetric in appearance  Psychiatry: Judgement and insight appear normal. Mood & affect appropriate.   Data Reviewed: I have personally reviewed following labs and imaging studies  CBC: Recent Labs  Lab 06/25/24 1959 06/26/24 0430 06/27/24 0531 06/28/24 0553 06/29/24 0503 06/30/24 0551  WBC 28.5* 25.3* 22.5* 20.6* 23.2* 22.9*  NEUTROABS 21.9*  --   --   --   --   --   HGB 9.4* 8.2* 8.7* 8.5* 8.3* 8.0*  HCT 26.9* 24.5* 24.4* 23.7* 24.4* 22.3*  MCV 101.5* 102.9* 100.0 99.6 103.4* 100.5*  PLT 140* 119* 111* 90* 82* 79*   Basic Metabolic Panel: Recent Labs  Lab 06/26/24 0430 06/27/24 0531 06/28/24 0553 06/29/24 0503 06/30/24 0551  NA 133* 130* 128* 126* 125*  K 4.3 4.9 4.8 4.9 4.7  CL 103 98 95* 93* 94*  CO2 21* 22 25 23 24   GLUCOSE 125* 104* 146* 132* 163*  BUN 11 12 13 17 13   CREATININE 0.55* 0.68 0.72 0.78 0.71  CALCIUM 7.8* 8.9  8.5* 8.4* 8.7*  MG 1.7  --   --   --   --   PHOS 2.4*  --   --   --   --    GFR: Estimated Creatinine Clearance: 107.6 mL/min (by C-G formula based on SCr of 0.71 mg/dL). Liver Function Tests: Recent Labs  Lab 06/26/24 0430 06/27/24 0531 06/28/24 0553 06/29/24 0503 06/30/24 0551  AST 36 64* 58* 74* 83*  ALT <5 13 5 13 17   ALKPHOS 137* 130* 133* 144* 144*  BILITOT 16.8* 18.7* 19.3* 20.3* 20.6*  PROT 5.8* 6.2* 6.3* 6.3* 6.3*  ALBUMIN  2.7* 3.0* 3.0* 3.0* 3.0*   Recent Labs  Lab 06/25/24 2042  LIPASE 107*   Recent Labs  Lab 06/25/24 2049  AMMONIA 23   Coagulation Profile: Recent Labs  Lab 06/25/24 2042  INR 2.8*   Cardiac Enzymes: No results for input(s): CKTOTAL, CKMB, CKMBINDEX, TROPONINI in the last 168 hours. BNP (last 3 results) No  results for input(s): PROBNP in the last 8760 hours. HbA1C: No results for input(s): HGBA1C in the last 72 hours. CBG: No results for input(s): GLUCAP in the last 168 hours. Lipid Profile: No results for input(s): CHOL, HDL, LDLCALC, TRIG, CHOLHDL, LDLDIRECT in the last 72 hours. Thyroid Function Tests: No results for input(s): TSH, T4TOTAL, FREET4, T3FREE, THYROIDAB in the last 72 hours. Anemia Panel: No results for input(s): VITAMINB12, FOLATE, FERRITIN, TIBC, IRON, RETICCTPCT in the last 72 hours. Sepsis Labs: Recent Labs  Lab 06/25/24 2306 06/25/24 2345  LATICACIDVEN 1.2 1.2    Recent Results (from the past 240 hours)  Blood culture (routine x 2)     Status: None (Preliminary result)   Collection Time: 06/25/24  8:42 PM   Specimen: BLOOD  Result Value Ref Range Status   Specimen Description   Final    BLOOD LEFT ANTECUBITAL Performed at [facility], [address] [place], [place] [address]    Special Requests   Final    BOTTLES DRAWN AEROBIC AND ANAEROBIC Blood Culture results may not be optimal due to an inadequate volume of blood received in  culture bottles Performed at [facility], [address], [place], [place] [address]    Culture   Final    NO GROWTH 4 DAYS Performed at [facility], [address], [place], [place] [address]    Report Status PENDING  Incomplete  Blood culture (routine x 2)     Status: None (Preliminary result)   Collection Time: 06/25/24  8:55 PM   Specimen: BLOOD  Result Value Ref Range Status   Specimen Description   Final    BLOOD RIGHT ANTECUBITAL Performed at [facility], [address] [place], [place] [address]    Special Requests   Final    BOTTLES DRAWN AEROBIC AND ANAEROBIC Blood Culture results may not be optimal due to an inadequate volume of blood received in culture bottles Performed at [facility], [address], [place], [place] [address]    Culture   Final    NO GROWTH 4 DAYS Performed at [facility], [address], [place], [place] [address]    Report Status PENDING  Incomplete  Resp panel by RT-PCR (RSV, Flu A&B, Covid) Anterior Nasal Swab     Status: None   Collection Time: 06/25/24  9:06 PM   Specimen: Anterior Nasal Swab  Result Value Ref Range Status   SARS Coronavirus 2 by RT PCR NEGATIVE NEGATIVE Final    Comment: (NOTE) SARS-CoV-2 target nucleic acids are NOT DETECTED.  The SARS-CoV-2 RNA is generally detectable in upper respiratory specimens during the acute phase of infection. The lowest concentration of SARS-CoV-2 viral copies this assay can detect is 138 copies/mL. A negative result does not preclude SARS-Cov-2 infection and should not be used as the sole basis for treatment or other patient management decisions. A negative result may occur with  improper specimen collection/handling, submission of specimen other than nasopharyngeal swab, presence of viral mutation(s) within the areas targeted by this assay, and inadequate number of viral copies(<138 copies/mL). A negative result must  be combined with clinical observations, patient history, and epidemiological information. The expected result is Negative.  Fact Sheet for Patients:  bloggercourse.com  Fact Sheet for Healthcare Providers:  seriousbroker.it  This test is no t yet approved or cleared by the [place]  FDA and  has been authorized for detection and/or diagnosis of SARS-CoV-2 by  FDA under an Emergency Use Authorization (EUA). This EUA will remain  in effect (meaning this test can be used) for the duration of the COVID-19 declaration under Section 564(b)(1) of the Act, 21 U.S.C.section 360bbb-3(b)(1), unless the authorization is terminated  or revoked sooner.       Influenza A by PCR NEGATIVE NEGATIVE Final   Influenza B by PCR NEGATIVE NEGATIVE Final    Comment: (NOTE) The Xpert Xpress SARS-CoV-2/FLU/RSV plus assay is intended as an aid in the diagnosis of influenza from Nasopharyngeal swab specimens and should not be used as a sole basis for treatment. Nasal washings and aspirates are unacceptable for Xpert Xpress SARS-CoV-2/FLU/RSV testing.  Fact Sheet for Patients: bloggercourse.com  Fact Sheet for Healthcare Providers: seriousbroker.it  This test is not yet approved or cleared by the [place]  FDA and has been authorized for detection and/or diagnosis of SARS-CoV-2 by FDA under an Emergency Use Authorization (EUA). This EUA will remain in effect (meaning this test can be used) for the duration of the COVID-19 declaration under Section 564(b)(1) of the Act, 21 U.S.C. section 360bbb-3(b)(1), unless the authorization is terminated or revoked.     Resp Syncytial Virus by PCR NEGATIVE NEGATIVE Final    Comment: (NOTE) Fact Sheet for Patients: bloggercourse.com  Fact Sheet for Healthcare Providers: seriousbroker.it  This test is not  yet approved or cleared by the [place]  FDA and has been authorized for detection and/or diagnosis of SARS-CoV-2 by FDA under an Emergency Use Authorization (EUA). This EUA will remain in effect (meaning this test can be used) for the duration of the COVID-19 declaration under Section 564(b)(1) of the Act, 21 U.S.C. section 360bbb-3(b)(1), unless the authorization is terminated or revoked.  Performed at [facility], [address] [place], [place] [address]   MRSA Next Gen by PCR, Nasal     Status: None   Collection Time: 06/26/24  4:30 AM   Specimen: Nasal Mucosa; Nasal Swab  Result Value Ref Range Status   MRSA by PCR Next Gen NOT DETECTED NOT DETECTED Final    Comment: (NOTE) The GeneXpert MRSA Assay (FDA approved for NASAL specimens only), is one component of a comprehensive MRSA colonization surveillance program. It is not intended to diagnose MRSA infection nor to guide or monitor treatment for MRSA infections. Test performance is not FDA approved in patients less than 79 years old. Performed at [facility], [address] [place], [place] [address]   Body fluid culture w Gram Stain     Status: None (Preliminary result)   Collection Time: 06/27/24 12:00 PM   Specimen: PATH Cytology Pleural fluid  Result Value Ref Range Status   Specimen Description   Final    PLEURAL Performed at [facility], [address] [place], [place] [address]    Special Requests   Final    NONE Performed at [facility], [address], [place], [place] [address]    Gram Stain NO WBC SEEN NO ORGANISMS SEEN   Final   Culture   Final    NO GROWTH 3 DAYS Performed at [facility], [address] [place], [place] [address]    Report Status PENDING  Incomplete      Radiology Studies: No results found.     Scheduled Meds:  folic acid   1 mg Oral Daily   [START ON 07/01/2024] furosemide   40 mg  Oral Daily   lactulose   20 g Oral BID  multivitamin with minerals  1 tablet Oral Daily   pantoprazole   40 mg Oral QHS   potassium chloride  SA  20 mEq Oral Daily   rifaximin   550 mg Oral BID   [START ON 07/01/2024] spironolactone   100 mg Oral Daily   thiamine   100 mg Oral Daily   Continuous Infusions:  sodium chloride  100 mL/hr at 06/30/24 9147   piperacillin -tazobactam 3.375 g (06/30/24 1213)     LOS: 5 days   Time spent: 25 minutes   Delon Hoe, DO [facility] 06/30/2024, 1:03 PM   Available via Epic secure chat 7am-7pm After these hours, please refer to coverage provider listed on [email or web address]  "

## 2024-06-30 NOTE — Progress Notes (Signed)
 Date and time results received: 06/30/24 0809 (use smartphrase .now to insert current time)  Test: total bilirubin Critical Value: 20.6  Name of Provider Notified: Rojelio Nest, DO  Orders Received? Or Actions Taken?: no new orders

## 2024-07-01 LAB — CULTURE, BLOOD (ROUTINE X 2)
Culture: NO GROWTH
Culture: NO GROWTH

## 2024-07-01 LAB — CBC
HCT: 21.2 % — ABNORMAL LOW (ref 39.0–52.0)
Hemoglobin: 7.4 g/dL — ABNORMAL LOW (ref 13.0–17.0)
MCH: 35.6 pg — ABNORMAL HIGH (ref 26.0–34.0)
MCHC: 34.9 g/dL (ref 30.0–36.0)
MCV: 101.9 fL — ABNORMAL HIGH (ref 80.0–100.0)
Platelets: 78 10*3/uL — ABNORMAL LOW (ref 150–400)
RBC: 2.08 MIL/uL — ABNORMAL LOW (ref 4.22–5.81)
RDW: 20.1 % — ABNORMAL HIGH (ref 11.5–15.5)
WBC: 21 10*3/uL — ABNORMAL HIGH (ref 4.0–10.5)
nRBC: 0 % (ref 0.0–0.2)

## 2024-07-01 LAB — COMPREHENSIVE METABOLIC PANEL WITH GFR
ALT: 21 U/L (ref 0–44)
AST: 83 U/L — ABNORMAL HIGH (ref 15–41)
Albumin: 2.8 g/dL — ABNORMAL LOW (ref 3.5–5.0)
Alkaline Phosphatase: 165 U/L — ABNORMAL HIGH (ref 38–126)
Anion gap: 8 (ref 5–15)
BUN: 9 mg/dL (ref 6–20)
CO2: 23 mmol/L (ref 22–32)
Calcium: 8.7 mg/dL — ABNORMAL LOW (ref 8.9–10.3)
Chloride: 99 mmol/L (ref 98–111)
Creatinine, Ser: 0.8 mg/dL (ref 0.61–1.24)
GFR, Estimated: >60 mL/min
Glucose, Bld: 110 mg/dL — ABNORMAL HIGH (ref 70–99)
Potassium: 4.8 mmol/L (ref 3.5–5.1)
Sodium: 130 mmol/L — ABNORMAL LOW (ref 135–145)
Total Bilirubin: 17.9 mg/dL — ABNORMAL HIGH (ref 0.0–1.2)
Total Protein: 5.9 g/dL — ABNORMAL LOW (ref 6.5–8.1)

## 2024-07-01 LAB — BODY FLUID CULTURE W GRAM STAIN
Culture: NO GROWTH
Gram Stain: NONE SEEN

## 2024-07-01 NOTE — Progress Notes (Addendum)
 [facility] is interested in transferring the patient number is below [phone]. [phone]

## 2024-07-01 NOTE — Plan of Care (Signed)

## 2024-07-01 NOTE — Progress Notes (Addendum)
 " PROGRESS NOTE    Jeffrey Mathis  FMW:[id number] DOB: [birth year] DOA: 06/25/2024 PCP: [facility]    Chief Complaint  Patient presents with   Abnormal labs    Brief Narrative:  Jeffrey Mathis is a 44 y.o. male with medical history significant for liver cirrhosis, GERD, history of alcohol abuse (none since December 2018), recurrent pancreatitis, who presents to the ER due to abnormal lab results.  The patient went to his primary care provider at the [facility] for a post hospital follow up appointment.  The patient was recently admitted from 06/14/2024 through 06/19/2024 for large left pleural effusion, hepatic hydrothorax/hemothorax with acute hypoxic respiratory failure, and E. coli bacteremia.  Outpatient blood work returned today with leukocytosis 34,000.  The patient was advised to go to the ER for further evaluation.  He presented to the ER at [facility] accompanied by his wife.    CT abdomen pelvis with contrast revealed moderate left-sided pleural effusion, decreased compared to prior, resolved right pleural effusion.  Improved aeration of the left upper lobe.  Liver cirrhosis with evidence of portal hypertension.  Large gastroesophageal varices and splenomegaly.  Decreased small volume ascites compared to prior.  Interim finding of long enhancing fluid collection along the right anterior pararenal space and the right colic gutter this is suspicious for developing abscess.     IR consulted, patient underwent thoracentesis for left-sided pleural effusio   Assessment & Plan:   Principal Problem:   Intra-abdominal abscess (HCC) Active Problems:   Leukocytosis   Hyperbilirubinemia   Esophageal varices (HCC)   Portal hypertension (HCC)   Coagulopathy   Alcoholic cirrhosis of liver with ascites (HCC)   Thrombocytopenia   Pleural effusion   Hyponatremia  #1 concern for developing intra-abdominal abscess with a leukocytosis  - Patient noted to have recently  been admitted for E. coli bacteremia question as to whether he completed cefadroxil  and Flagyl  in the outpatient setting however it is noted per Dr. Rojelio that patient did complete this. - Patient sent to the ED due to leukocytosis however patient asymptomatic. - Patient denied any respiratory symptoms, no significant abdominal pain, no dysuria. - Leukocytosis initially noted to have improved slight trend up but trending back down. - Repeat CT abdomen and pelvis done noted cirrhosis with portal venous hypertension, including gastroesophageal right retroperitoneal varices, mild ascites slightly progressive, mild splenomegaly.  Gallbladder distention with layering sludge and small stones without definite pericholecystic inflammatory change.  Small left pleural effusion decreased from prior with associated left basilar compressive atelectasis.  Emphysema. - Concern for intra-abdominal abscess noted on prior CT scan not noted on repeat CT scan. - Continue IV Zosyn .  2.  End-stage liver disease/alcoholic cirrhosis with ascites/portal hypertension/gastroesophageal varices/splenomegaly/jaundice/coagulopathy/thrombocytopenia -Patient being followed in outpatient setting by gastroenterology at [facility]. - Per patient patient being considered for possible liver transplant. -MELD Na score of 31. - Patient currently stable. - Continue Lasix , spironolactone , lactulose , Xifaxan . - Was informed by RN that [facility] transplant had called and was interested in transferring patient, called the number listed per RN [phone] extension [phone] with no response.  3.  Moderate left-sided pleural effusion -Status post thoracentesis 06/27/2024 with 1.3 L of bloody fluid removed. - Cultures from pleural effusion with no growth to date. - Body fluid consistent with exudative effusion. - Patient on empiric IV Zosyn .  4.  Hyponatremia -Likely secondary to cirrhosis. - Lasix  and spironolactone  are held on 06/30/2024 and resume  today 07/01/2024 - Sodium at  130 today. - Follow.   DVT prophylaxis: SCDs Code Status: Full Family Communication: Updated patient and wife at the bedside. Disposition: TBD  Status is: Inpatient Remains inpatient appropriate because: Severity of illness   Consultants:  None  Procedures:  CT chest abdomen and pelvis 06/25/2024, 06/30/2024 Chest x-ray 06/25/2024, 06/27/2024, 06/28/2024 Ultrasound-guided left thoracentesis yielding 1.3 L of bloody fluid per IR 06/27/2024  Antimicrobials:  Anti-infectives (From admission, onward)    Start     Dose/Rate Route Frequency Ordered Stop   06/26/24 1000  vancomycin  (VANCOCIN ) IVPB 1000 mg/200 mL premix  Status:  Discontinued        1,000 mg 200 mL/hr over 60 Minutes Intravenous Every 12 hours 06/25/24 2327 06/26/24 0026   06/26/24 1000  linezolid  (ZYVOX ) IVPB 600 mg  Status:  Discontinued        600 mg 300 mL/hr over 60 Minutes Intravenous Every 12 hours 06/26/24 0026 06/26/24 1202   06/26/24 0800  rifaximin  (XIFAXAN ) tablet 550 mg        550 mg Oral 2 times daily 06/25/24 2330     06/26/24 0400  piperacillin -tazobactam (ZOSYN ) IVPB 3.375 g        3.375 g 12.5 mL/hr over 240 Minutes Intravenous Every 8 hours 06/26/24 0023     06/25/24 2245  vancomycin  (VANCOREADY) IVPB 1250 mg/250 mL        1,250 mg 166.7 mL/hr over 90 Minutes Intravenous  Once 06/25/24 2239 06/26/24 0031   06/25/24 2100  piperacillin -tazobactam (ZOSYN ) IVPB 3.375 g        3.375 g 100 mL/hr over 30 Minutes Intravenous  Once 06/25/24 2051 06/25/24 2130         Subjective: Patient sitting up in bed eating a salad.  Denies any chest pain or shortness of breath.  Denies any abdominal pain.  Denies any abdominal distention.  Wife at bedside.  Per wife patient in the process of being considered for liver transplant.  Patient being followed by gastroenterology at the [facility].  Objective: Vitals:   06/30/24 1202 06/30/24 1940 07/01/24 0413 07/01/24 1157  BP: 117/67 (!) 115/59  116/61 118/64  Pulse: 81 (!) 105 90 99  Resp: 16 18 16    Temp: (!) 97.4 F (36.3 C) 98.3 F (36.8 C) 98.9 F (37.2 C) 98.4 F (36.9 C)  TempSrc: Oral Oral Oral Oral  SpO2:    98%  Weight:      Height:       No intake or output data in the 24 hours ending 07/01/24 1900  Filed Weights   06/25/24 1924  Weight: 62.6 kg    Examination:  General exam: Appears calm and comfortable.  Jaundiced. Respiratory system: Clear to auscultation. Respiratory effort normal. Cardiovascular system: S1 & S2 heard, RRR. No JVD, murmurs, rubs, gallops or clicks. No pedal edema. Gastrointestinal system: Abdomen is mildly distended, soft, nontender, no rebound, no guarding.  Positive bowel movements.  Central nervous system: Alert and oriented. No focal neurological deficits. Extremities: Symmetric 5 x 5 power. Skin: No rashes, lesions or ulcers Psychiatry: Judgement and insight appear normal. Mood & affect appropriate.     Data Reviewed: I have personally reviewed following labs and imaging studies  CBC: Recent Labs  Lab 06/25/24 1959 06/26/24 0430 06/27/24 0531 06/28/24 0553 06/29/24 0503 06/30/24 0551 07/01/24 0548  WBC 28.5*   < > 22.5* 20.6* 23.2* 22.9* 21.0*  NEUTROABS 21.9*  --   --   --   --   --   --  HGB 9.4*   < > 8.7* 8.5* 8.3* 8.0* 7.4*  HCT 26.9*   < > 24.4* 23.7* 24.4* 22.3* 21.2*  MCV 101.5*   < > 100.0 99.6 103.4* 100.5* 101.9*  PLT 140*   < > 111* 90* 82* 79* 78*   < > = values in this interval not displayed.    Basic Metabolic Panel: Recent Labs  Lab 06/26/24 0430 06/27/24 0531 06/28/24 0553 06/29/24 0503 06/30/24 0551 07/01/24 0548  NA 133* 130* 128* 126* 125* 130*  K 4.3 4.9 4.8 4.9 4.7 4.8  CL 103 98 95* 93* 94* 99  CO2 21* 22 25 23 24 23   GLUCOSE 125* 104* 146* 132* 163* 110*  BUN 11 12 13 17 13 9   CREATININE 0.55* 0.68 0.72 0.78 0.71 0.80  CALCIUM 7.8* 8.9 8.5* 8.4* 8.7* 8.7*  MG 1.7  --   --   --   --   --   PHOS 2.4*  --   --   --   --   --      GFR: Estimated Creatinine Clearance: 107.6 mL/min (by C-G formula based on SCr of 0.8 mg/dL).  Liver Function Tests: Recent Labs  Lab 06/27/24 0531 06/28/24 0553 06/29/24 0503 06/30/24 0551 07/01/24 0548  AST 64* 58* 74* 83* 83*  ALT 13 5 13 17 21   ALKPHOS 130* 133* 144* 144* 165*  BILITOT 18.7* 19.3* 20.3* 20.6* 17.9*  PROT 6.2* 6.3* 6.3* 6.3* 5.9*  ALBUMIN  3.0* 3.0* 3.0* 3.0* 2.8*    CBG: No results for input(s): GLUCAP in the last 168 hours.   Recent Results (from the past 240 hours)  Blood culture (routine x 2)     Status: None   Collection Time: 06/25/24  8:42 PM   Specimen: BLOOD  Result Value Ref Range Status   Specimen Description   Final    BLOOD LEFT ANTECUBITAL Performed at [facility], [address] [place], [place] [address]    Special Requests   Final    BOTTLES DRAWN AEROBIC AND ANAEROBIC Blood Culture results may not be optimal due to an inadequate volume of blood received in culture bottles Performed at [facility], [address] [place], [place] [address]    Culture   Final    NO GROWTH 5 DAYS Performed at [facility], [address], [place], [place] [address]    Report Status 07/01/2024 FINAL  Final  Blood culture (routine x 2)     Status: None   Collection Time: 06/25/24  8:55 PM   Specimen: BLOOD  Result Value Ref Range Status   Specimen Description   Final    BLOOD RIGHT ANTECUBITAL Performed at [facility], [address] [place], [place] [address]    Special Requests   Final    BOTTLES DRAWN AEROBIC AND ANAEROBIC Blood Culture results may not be optimal due to an inadequate volume of blood received in culture bottles Performed at [facility], [address], [place], [place] [address]    Culture   Final    NO GROWTH 5 DAYS Performed at [facility], [address], [place], [place] [address]    Report Status 07/01/2024 FINAL   Final  Resp panel by RT-PCR (RSV, Flu A&B, Covid) Anterior Nasal Swab     Status: None   Collection Time: 06/25/24  9:06 PM   Specimen: Anterior Nasal Swab  Result Value Ref Range Status  SARS Coronavirus 2 by RT PCR NEGATIVE NEGATIVE Final    Comment: (NOTE) SARS-CoV-2 target nucleic acids are NOT DETECTED.  The SARS-CoV-2 RNA is generally detectable in upper respiratory specimens during the acute phase of infection. The lowest concentration of SARS-CoV-2 viral copies this assay can detect is 138 copies/mL. A negative result does not preclude SARS-Cov-2 infection and should not be used as the sole basis for treatment or other patient management decisions. A negative result may occur with  improper specimen collection/handling, submission of specimen other than nasopharyngeal swab, presence of viral mutation(s) within the areas targeted by this assay, and inadequate number of viral copies(<138 copies/mL). A negative result must be combined with clinical observations, patient history, and epidemiological information. The expected result is Negative.  Fact Sheet for Patients:  bloggercourse.com  Fact Sheet for Healthcare Providers:  seriousbroker.it  This test is no t yet approved or cleared by the [place]  FDA and  has been authorized for detection and/or diagnosis of SARS-CoV-2 by FDA under an Emergency Use Authorization (EUA). This EUA will remain  in effect (meaning this test can be used) for the duration of the COVID-19 declaration under Section 564(b)(1) of the Act, 21 U.S.C.section 360bbb-3(b)(1), unless the authorization is terminated  or revoked sooner.       Influenza A by PCR NEGATIVE NEGATIVE Final   Influenza B by PCR NEGATIVE NEGATIVE Final    Comment: (NOTE) The Xpert Xpress SARS-CoV-2/FLU/RSV plus assay is intended as an aid in the diagnosis of influenza from Nasopharyngeal swab specimens and should not be  used as a sole basis for treatment. Nasal washings and aspirates are unacceptable for Xpert Xpress SARS-CoV-2/FLU/RSV testing.  Fact Sheet for Patients: bloggercourse.com  Fact Sheet for Healthcare Providers: seriousbroker.it  This test is not yet approved or cleared by the [place]  FDA and has been authorized for detection and/or diagnosis of SARS-CoV-2 by FDA under an Emergency Use Authorization (EUA). This EUA will remain in effect (meaning this test can be used) for the duration of the COVID-19 declaration under Section 564(b)(1) of the Act, 21 U.S.C. section 360bbb-3(b)(1), unless the authorization is terminated or revoked.     Resp Syncytial Virus by PCR NEGATIVE NEGATIVE Final    Comment: (NOTE) Fact Sheet for Patients: bloggercourse.com  Fact Sheet for Healthcare Providers: seriousbroker.it  This test is not yet approved or cleared by the [place]  FDA and has been authorized for detection and/or diagnosis of SARS-CoV-2 by FDA under an Emergency Use Authorization (EUA). This EUA will remain in effect (meaning this test can be used) for the duration of the COVID-19 declaration under Section 564(b)(1) of the Act, 21 U.S.C. section 360bbb-3(b)(1), unless the authorization is terminated or revoked.  Performed at [facility], [address], [place], [place] [address]   MRSA Next Gen by PCR, Nasal     Status: None   Collection Time: 06/26/24  4:30 AM   Specimen: Nasal Mucosa; Nasal Swab  Result Value Ref Range Status   MRSA by PCR Next Gen NOT DETECTED NOT DETECTED Final    Comment: (NOTE) The GeneXpert MRSA Assay (FDA approved for NASAL specimens only), is one component of a comprehensive MRSA colonization surveillance program. It is not intended to diagnose MRSA infection nor to guide or monitor treatment for MRSA infections. Test  performance is not FDA approved in patients less than 9 years old. Performed at [facility], [address] [place], [place] [address]   Body fluid culture  w Gram Stain     Status: None   Collection Time: 06/27/24 12:00 PM   Specimen: PATH Cytology Pleural fluid  Result Value Ref Range Status   Specimen Description   Final    PLEURAL Performed at [facility], [address] [place], [place] [address]    Special Requests   Final    NONE Performed at [facility], [address], [place], [place] [address]    Gram Stain NO WBC SEEN NO ORGANISMS SEEN   Final   Culture   Final    NO GROWTH 3 DAYS Performed at [facility], [address] [place], [place] [address]    Report Status 07/01/2024 FINAL  Final         Radiology Studies: CT CHEST ABDOMEN PELVIS W CONTRAST Result Date: 06/30/2024 EXAM: CT CHEST WITH CONTRAST 06/30/2024 02:36:33 PM TECHNIQUE: CT of the chest was performed with the administration of 100 mL of iohexol  (OMNIPAQUE ) 300 MG/ML solution. Multiplanar reformatted images are provided for review. Automated exposure control, iterative reconstruction, and/or weight based adjustment of the mA/kV was utilized to reduce the radiation dose to as low as reasonably achievable. COMPARISON: 06/25/2024. CLINICAL HISTORY: Persistent elevation of white blood cell count. Chronic liver disease, jaundice, leukocytosis. FINDINGS: MEDIASTINUM: Heart and pericardium are unremarkable. Global cardiac size within normal limits. No pericardial effusion. Central pulmonary arteries are of normal caliber. Mild atherosclerotic calcification within the thoracic aorta. No aortic aneurysm. The central airways are clear. LYMPH NODES: No mediastinal, hilar or axillary lymphadenopathy. LUNGS AND PLEURA: Mild emphysema. Small left pleural effusion, decreased in size from prior examination. Associated left basilar compressive atelectasis. No  focal consolidation or pulmonary edema. Low-dose CT lung cancer screening is recommended for patients who are 42-57 years of age with a 20+ pack-year history of smoking and who are currently smoking or quit <=15 years ago. SOFT TISSUES/BONES: Osseous structures are age appropriate. No acute bone abnormality. No lytic or blastic bone lesion. UPPER ABDOMEN: Changes of cirrhosis again identified with hypertrophy of the hepatic artery. The gallbladder is distended and contains layering sludge and small stones. No definite superimposed pericholecystic inflammatory change. Mild ascites is present, slightly progressive since prior examination. Numerous gastroesophageal and right retroperitoneal varices are again identified in keeping with changes of portal venous hypertension. Mild splenomegaly with a spleen measuring 15.5 cm in greatest dimension. No intrasplenic lesion. The appendix is not clearly identified, however, there are no secondary signs of appendicitis within the right lower quadrant. The stomach, small bowel, and large bowel are otherwise unremarkable. Mild aortoiliac atherosclerotic calcification. IMPRESSION: 1. Cirrhosis with portal venous hypertension, including gastroesophageal and right retroperitoneal varices, mild ascites (slightly progressive), and mild splenomegaly (15.5 cm). 2. Gallbladder distention with layering sludge and small stones, without definite pericholecystic inflammatory change. If there is clinical concern for acute cholecystitis, however, dedicated right upper quadrant sonography may be helpful for further evaluation. 3. Small left pleural effusion, decreased from prior, with associated left basilar compressive atelectasis. 4. Emphysema ([id number]). Electronically signed by: Dorethia Molt MD 06/30/2024 11:05 PM EST RP Workstation: [id number]        Scheduled Meds:  folic acid   1 mg Oral Daily   furosemide   40 mg Oral Daily   lactulose   20 g Oral BID   multivitamin with  minerals  1 tablet Oral Daily   pantoprazole   40 mg Oral QHS   potassium chloride  SA  20 mEq Oral Daily   rifaximin   550 mg  Oral BID   spironolactone   100 mg Oral Daily   thiamine   100 mg Oral Daily   Continuous Infusions:  piperacillin -tazobactam 3.375 g (07/01/24 1233)     LOS: 6 days    Time spent: 40 minutes    Toribio Hummer, MD [facility]   To contact the attending provider between 7A-7P or the covering provider during after hours 7P-7A, please log into the web site [email or web address] and access using universal [place] password for that web site. If you do not have the password, please call the hospital operator.  07/01/2024, 7:00 PM    "

## 2024-07-02 DIAGNOSIS — D72829 Elevated white blood cell count, unspecified: Secondary | ICD-10-CM | POA: Diagnosis not present

## 2024-07-02 DIAGNOSIS — F1011 Alcohol abuse, in remission: Secondary | ICD-10-CM | POA: Diagnosis not present

## 2024-07-02 DIAGNOSIS — J90 Pleural effusion, not elsewhere classified: Secondary | ICD-10-CM | POA: Diagnosis not present

## 2024-07-02 DIAGNOSIS — R7881 Bacteremia: Secondary | ICD-10-CM | POA: Diagnosis not present

## 2024-07-02 DIAGNOSIS — K7031 Alcoholic cirrhosis of liver with ascites: Secondary | ICD-10-CM | POA: Diagnosis not present

## 2024-07-02 DIAGNOSIS — A419 Sepsis, unspecified organism: Secondary | ICD-10-CM

## 2024-07-02 DIAGNOSIS — B9620 Unspecified Escherichia coli [E. coli] as the cause of diseases classified elsewhere: Secondary | ICD-10-CM

## 2024-07-02 DIAGNOSIS — R161 Splenomegaly, not elsewhere classified: Secondary | ICD-10-CM

## 2024-07-02 DIAGNOSIS — R188 Other ascites: Secondary | ICD-10-CM

## 2024-07-02 DIAGNOSIS — J942 Hemothorax: Secondary | ICD-10-CM

## 2024-07-02 LAB — PROTIME-INR
INR: 2.4 — ABNORMAL HIGH (ref 0.8–1.2)
Prothrombin Time: 27.6 s — ABNORMAL HIGH (ref 11.4–15.2)

## 2024-07-02 LAB — CBC WITH DIFFERENTIAL/PLATELET
Abs Immature Granulocytes: 0.79 10*3/uL — ABNORMAL HIGH (ref 0.00–0.07)
Basophils Absolute: 0 10*3/uL (ref 0.0–0.1)
Basophils Relative: 0 %
Eosinophils Absolute: 0.2 10*3/uL (ref 0.0–0.5)
Eosinophils Relative: 1 %
HCT: 23.2 % — ABNORMAL LOW (ref 39.0–52.0)
Hemoglobin: 7.8 g/dL — ABNORMAL LOW (ref 13.0–17.0)
Immature Granulocytes: 4 %
Lymphocytes Relative: 11 %
Lymphs Abs: 2.5 10*3/uL (ref 0.7–4.0)
MCH: 35.3 pg — ABNORMAL HIGH (ref 26.0–34.0)
MCHC: 33.6 g/dL (ref 30.0–36.0)
MCV: 105 fL — ABNORMAL HIGH (ref 80.0–100.0)
Monocytes Absolute: 1.6 10*3/uL — ABNORMAL HIGH (ref 0.1–1.0)
Monocytes Relative: 7 %
Neutro Abs: 16.5 10*3/uL — ABNORMAL HIGH (ref 1.7–7.7)
Neutrophils Relative %: 77 %
Platelets: 98 10*3/uL — ABNORMAL LOW (ref 150–400)
RBC: 2.21 MIL/uL — ABNORMAL LOW (ref 4.22–5.81)
RDW: 20.2 % — ABNORMAL HIGH (ref 11.5–15.5)
Smear Review: NORMAL
WBC: 21.6 10*3/uL — ABNORMAL HIGH (ref 4.0–10.5)
nRBC: 0.1 % (ref 0.0–0.2)

## 2024-07-02 LAB — COMPREHENSIVE METABOLIC PANEL WITH GFR
ALT: 22 U/L (ref 0–44)
AST: 89 U/L — ABNORMAL HIGH (ref 15–41)
Albumin: 3 g/dL — ABNORMAL LOW (ref 3.5–5.0)
Alkaline Phosphatase: 190 U/L — ABNORMAL HIGH (ref 38–126)
Anion gap: 8 (ref 5–15)
BUN: 10 mg/dL (ref 6–20)
CO2: 24 mmol/L (ref 22–32)
Calcium: 8.8 mg/dL — ABNORMAL LOW (ref 8.9–10.3)
Chloride: 96 mmol/L — ABNORMAL LOW (ref 98–111)
Creatinine, Ser: 0.69 mg/dL (ref 0.61–1.24)
GFR, Estimated: >60 mL/min
Glucose, Bld: 150 mg/dL — ABNORMAL HIGH (ref 70–99)
Potassium: 4.2 mmol/L (ref 3.5–5.1)
Sodium: 129 mmol/L — ABNORMAL LOW (ref 135–145)
Total Bilirubin: 18.3 mg/dL (ref 0.0–1.2)
Total Protein: 6.4 g/dL — ABNORMAL LOW (ref 6.5–8.1)

## 2024-07-02 MED ORDER — AMOXICILLIN-POT CLAVULANATE 875-125 MG PO TABS
1.0000 | ORAL_TABLET | Freq: Two times a day (BID) | ORAL | Status: DC
  Administered 2024-07-02 – 2024-07-03 (×2): 1 via ORAL
  Filled 2024-07-02 (×2): qty 1

## 2024-07-02 NOTE — Plan of Care (Signed)

## 2024-07-02 NOTE — Consult Note (Signed)
 "       Date of Admission:  06/25/2024          Reason for Consult: ? Intra-abdominal abscess, recent E coli bacteremia    Referring Provider: Toribio Hummer, MD   Assessment:  Cirrhosis secondary to alcohol abuse in remission since 2018 with advanced pathology in need of liver transplantation Recent admission with E coli bacteremia, ascites and hydro hemothorax sp thoracentesis sp treatment with effective antibiotics though ? Whether he took both cefadroxil  and Flagyl  as an outpatient Readmission due to elevated white blood cell count found to still have a pleural effusion status post thoracentesis Question of abscess raised on CT scan from the 22nd but not mentioned on the CT scan from 27th and he says that he had a scan done at the [facility] that also did not mention this Chronic leukocytosis  Plan:  Would switch him to augmentin  and have him complete 14 days of antibiotics with day number one being  06/26/2024 Standard universal precautions   I will sign off for now  Please call with further questions.     HPI: Jeffrey Mathis is a 44 year old man with a past medical history significant for advanced cirrhosis of the liver secondary to alcohol abuse though he has been clean since December 2018 also with recurrent pancreatitis ascites being evaluated for liver transplantation who had been admitted in early January with a large left-sided pleural effusion hepatic hydrothorax hemothorax and acute hypoxic respiratory failure along with E. coli bacteremia.  He had been on Zosyn  initially during that admission 1/11/26and then switched over to ceftriaxone  and Flagyl  thru 06/19/2024 and DC on cefadroxil  and flagyl  though not clear he was taking both based on conversation I had with Dr. Hummer.   Apparently he was feeling fine without any symptoms whatsoever but had blood work done with his PCP which showed white count of 34,000 due to this lab finding he was directed to come to the ER for  evaluation in the ER he was jaundiced as he is typically but was asymptomatic repeat CBC showed white count of 20.5 thousand with neutrophilic predominance and some anemia and thrombocytopenia elevated lipase mild elevation of his transaminases and his bilirubin was 21.1.  He had a CT of the abdomen pelvis done with contrast that showed a moderate left-sided pleural effusion with decreased compared to prior and resolved right-sided pleural effusion with some cirrhosis and portal hypertension and large gastroesophageal varices and splenomegaly.  Ascites had decreased compared to prior.  Also found to have some distended gallbladder with some thickening and some trace para consult cholecystic fluid.  Imaging performed on the 22nd which was a CT abdomen pelvis also mentioned interim finding of long thin rim-enhancing fluid collection along the right anterior pararenal space and right colic gutter which is suspicious for developing abscess.  He has been on zosyn  this admission.  The patient he told me he had also had a CT scan done at the [facility] that same week which did not show an abscess subsequent we the patient has undergone repeat thoracentesis that yielded 1.3 L of bloody fluid.  Repeat CT scan of the chest abdomen pelvis showed cirrhosis with portal venous hypertension that was seen before along with the varices and mild ascites with splenomegaly with some gallbladder distention and layering sludge and small stones without pericholecystic inflammatory changes and a small left-sided pleural effusion as well as emphysema.  We were consulted because the patient continues to have elevated  white count in the 20,000 range currently 21.6 thousand today with a neutrophilic predominance.  There was also concern as to whether the patient really did have an abscess before.  I think given that we have 3 CT scans done within a few days and the most recent 1 and the 1 done at the [facility] did not  mention an abscess and the reading for the abscess on the 22nd does not appear to be a very firm call.  I attempted to reach the radiology reading room but was on hold for quite a long time.  I would presume he does not have an abscess based on the all the above.  He is anxious to be DC if it is safe to do so and with intent of pursuing transportation to the [facility] in [place] where he is going to be evaluated for liver transplantation.  They have also told him that they are happy to workup his leukocytosis there as well. This is his 7th day of zosyn .  His pleural effusion did have 5756 wbc with 74% PMN on 11th then 1197, 45% on the 24th  Protein and LDH 4.3, 1037 and then 7.6 (LDH not done)  I think  in terms of his bacteremia he has --iF he took his cefadroxil  already completed sufficent antibiotics. There was some concern though that he did not.  On the chance that we were still dealing with residual infection in pleural space AND that he might not have had his outpatient antibiotics taken correctly I would recommend switching him over to Augmentin  and having him complete a total of 14 days of therapy from this admission.  The [facility] in [place] to have said that they are happy to work him up further.   I personally spent a total of 83 minutes in the care of the patient today including preparing to see the patient, getting/reviewing separately obtained history, performing a medically appropriate exam/evaluation, counseling and educating, placing orders, referring and communicating with other health care professionals, documenting clinical information in the EHR, independently interpreting results, communicating results, and coordinating care.   Evaluation of the patient requires complex antimicrobial therapy evaluation, counseling , isolation needs to reduce disease transmission and risk assessment and mitigation.     Review of Systems: Review of Systems  Constitutional:   Negative for chills, fever, malaise/fatigue and weight loss.  HENT:  Negative for congestion and sore throat.   Eyes:  Negative for blurred vision and photophobia.  Respiratory:  Negative for cough, shortness of breath and wheezing.   Cardiovascular:  Negative for chest pain, palpitations and leg swelling.  Gastrointestinal:  Negative for abdominal pain, blood in stool, constipation, diarrhea, heartburn, melena, nausea and vomiting.  Genitourinary:  Negative for dysuria, flank pain and hematuria.  Musculoskeletal:  Negative for back pain, falls, joint pain and myalgias.  Skin:  Negative for itching and rash.  Neurological:  Negative for dizziness, focal weakness, loss of consciousness, weakness and headaches.  Endo/Heme/Allergies:  Does not bruise/bleed easily.  Psychiatric/Behavioral:  Negative for depression and suicidal ideas. The patient does not have insomnia.     Past Medical History:  Diagnosis Date   Pancreatitis    PTSD (post-traumatic stress disorder)    PTSD (post-traumatic stress disorder)     Social History[1]  Family History  Problem Relation Age of Onset   Cancer Mother    Bipolar disorder Mother    Cancer Father    Pancreatitis Father    Allergies[2]  OBJECTIVE:  Blood pressure 115/65, pulse 94, temperature 98.9 F (37.2 C), temperature source Oral, resp. rate 20, height 6' 1 (1.854 m), weight 62.6 kg, SpO2 100%.  Physical Exam Constitutional:      Appearance: He is well-developed.  HENT:     Head: Normocephalic and atraumatic.  Eyes:     General: Scleral icterus present.     Conjunctiva/sclera: Conjunctivae normal.  Cardiovascular:     Rate and Rhythm: Normal rate and regular rhythm.  Pulmonary:     Effort: Pulmonary effort is normal. No respiratory distress.     Breath sounds: No wheezing.  Abdominal:     General: There is distension.     Palpations: Abdomen is soft.  Musculoskeletal:        General: No tenderness. Normal range of motion.      Cervical back: Normal range of motion and neck supple.  Skin:    General: Skin is warm and dry.     Coloration: Skin is jaundiced and pale.     Findings: No erythema or rash.  Neurological:     General: No focal deficit present.     Mental Status: He is alert and oriented to person, place, and time.  Psychiatric:        Mood and Affect: Mood normal.        Behavior: Behavior normal.        Thought Content: Thought content normal.        Judgment: Judgment normal.     Lab Results Lab Results  Component Value Date   WBC 21.6 (H) 07/02/2024   HGB 7.8 (L) 07/02/2024   HCT 23.2 (L) 07/02/2024   MCV 105.0 (H) 07/02/2024   PLT 98 (L) 07/02/2024    Lab Results  Component Value Date   CREATININE 0.69 07/02/2024   BUN 10 07/02/2024   NA 129 (L) 07/02/2024   K 4.2 07/02/2024   CL 96 (L) 07/02/2024   CO2 24 07/02/2024    Lab Results  Component Value Date   ALT 22 07/02/2024   AST 89 (H) 07/02/2024   ALKPHOS 190 (H) 07/02/2024   BILITOT 18.3 (HH) 07/02/2024     Microbiology: Recent Results (from the past 240 hours)  Blood culture (routine x 2)     Status: None   Collection Time: 06/25/24  8:42 PM   Specimen: BLOOD  Result Value Ref Range Status   Specimen Description   Final    BLOOD LEFT ANTECUBITAL Performed at [facility], [address], [place], [place] [address]    Special Requests   Final    BOTTLES DRAWN AEROBIC AND ANAEROBIC Blood Culture results may not be optimal due to an inadequate volume of blood received in culture bottles Performed at [facility], [address] [place], [place] [address]    Culture   Final    NO GROWTH 5 DAYS Performed at [facility], [address], [place], [place] [address]    Report Status 07/01/2024 FINAL  Final  Blood culture (routine x 2)     Status: None   Collection Time: 06/25/24  8:55 PM   Specimen: BLOOD  Result Value Ref Range Status   Specimen Description   Final     BLOOD RIGHT ANTECUBITAL Performed at [facility], [address], [place], [place] [address]    Special Requests   Final    BOTTLES DRAWN AEROBIC AND ANAEROBIC Blood Culture results may not be optimal due  to an inadequate volume of blood received in culture bottles Performed at [facility], [address] [place], [place] [address]    Culture   Final    NO GROWTH 5 DAYS Performed at [facility], [address] [place], [place] [address]    Report Status 07/01/2024 FINAL  Final  Resp panel by RT-PCR (RSV, Flu A&B, Covid) Anterior Nasal Swab     Status: None   Collection Time: 06/25/24  9:06 PM   Specimen: Anterior Nasal Swab  Result Value Ref Range Status   SARS Coronavirus 2 by RT PCR NEGATIVE NEGATIVE Final    Comment: (NOTE) SARS-CoV-2 target nucleic acids are NOT DETECTED.  The SARS-CoV-2 RNA is generally detectable in upper respiratory specimens during the acute phase of infection. The lowest concentration of SARS-CoV-2 viral copies this assay can detect is 138 copies/mL. A negative result does not preclude SARS-Cov-2 infection and should not be used as the sole basis for treatment or other patient management decisions. A negative result may occur with  improper specimen collection/handling, submission of specimen other than nasopharyngeal swab, presence of viral mutation(s) within the areas targeted by this assay, and inadequate number of viral copies(<138 copies/mL). A negative result must be combined with clinical observations, patient history, and epidemiological information. The expected result is Negative.  Fact Sheet for Patients:  bloggercourse.com  Fact Sheet for Healthcare Providers:  seriousbroker.it  This test is no t yet approved or cleared by the [place]  FDA and  has been authorized for detection and/or diagnosis of SARS-CoV-2 by FDA under an Emergency Use  Authorization (EUA). This EUA will remain  in effect (meaning this test can be used) for the duration of the COVID-19 declaration under Section 564(b)(1) of the Act, 21 U.S.C.section 360bbb-3(b)(1), unless the authorization is terminated  or revoked sooner.       Influenza A by PCR NEGATIVE NEGATIVE Final   Influenza B by PCR NEGATIVE NEGATIVE Final    Comment: (NOTE) The Xpert Xpress SARS-CoV-2/FLU/RSV plus assay is intended as an aid in the diagnosis of influenza from Nasopharyngeal swab specimens and should not be used as a sole basis for treatment. Nasal washings and aspirates are unacceptable for Xpert Xpress SARS-CoV-2/FLU/RSV testing.  Fact Sheet for Patients: bloggercourse.com  Fact Sheet for Healthcare Providers: seriousbroker.it  This test is not yet approved or cleared by the [place]  FDA and has been authorized for detection and/or diagnosis of SARS-CoV-2 by FDA under an Emergency Use Authorization (EUA). This EUA will remain in effect (meaning this test can be used) for the duration of the COVID-19 declaration under Section 564(b)(1) of the Act, 21 U.S.C. section 360bbb-3(b)(1), unless the authorization is terminated or revoked.     Resp Syncytial Virus by PCR NEGATIVE NEGATIVE Final    Comment: (NOTE) Fact Sheet for Patients: bloggercourse.com  Fact Sheet for Healthcare Providers: seriousbroker.it  This test is not yet approved or cleared by the [place]  FDA and has been authorized for detection and/or diagnosis of SARS-CoV-2 by FDA under an Emergency Use Authorization (EUA). This EUA will remain in effect (meaning this test can be used) for the duration of the COVID-19 declaration under Section 564(b)(1) of the Act, 21 U.S.C. section 360bbb-3(b)(1), unless the authorization is terminated or revoked.  Performed at [facility],  [address], [place], [place] [address]   MRSA Next Gen by PCR, Nasal     Status: None   Collection Time: 06/26/24  4:30  AM   Specimen: Nasal Mucosa; Nasal Swab  Result Value Ref Range Status   MRSA by PCR Next Gen NOT DETECTED NOT DETECTED Final    Comment: (NOTE) The GeneXpert MRSA Assay (FDA approved for NASAL specimens only), is one component of a comprehensive MRSA colonization surveillance program. It is not intended to diagnose MRSA infection nor to guide or monitor treatment for MRSA infections. Test performance is not FDA approved in patients less than 31 years old. Performed at [facility], [address], [place], [place] [address]   Body fluid culture w Gram Stain     Status: None   Collection Time: 06/27/24 12:00 PM   Specimen: PATH Cytology Pleural fluid  Result Value Ref Range Status   Specimen Description   Final    PLEURAL Performed at [facility], [address], [place], [place] [address]    Special Requests   Final    NONE Performed at [facility], [address] [place], [place] [address]    Gram Stain NO WBC SEEN NO ORGANISMS SEEN   Final   Culture   Final    NO GROWTH 3 DAYS Performed at [facility], [address], [place], [place] [address]    Report Status 07/01/2024 FINAL  Final    Jomarie Fleeta Rothman, MD [facility] [phone] pager  07/02/2024, 4:50 PM      [1]  Social History Tobacco Use   Smoking status: Every Day    Current packs/day: 0.15    Types: Cigarettes   Smokeless tobacco: Never   Tobacco comments:    Not interested   Vaping Use   Vaping status: Never Used  Substance Use Topics   Alcohol use: Yes    Alcohol/week: 6.0 standard drinks of alcohol    Types: 6 Cans of beer per week   Drug use: Yes    Comment: CBD use also  [2] No Known Allergies  "

## 2024-07-02 NOTE — Progress Notes (Signed)
 " PROGRESS NOTE    Jeffrey Mathis  FMW:[id number] DOB: Feb 20, 1981 DOA: 06/25/2024 PCP: [facility]    Chief Complaint  Patient presents with   Abnormal labs    Brief Narrative:  Jeffrey Mathis is a 44 y.o. male with medical history significant for liver cirrhosis, GERD, history of alcohol abuse (none since December 2018), recurrent pancreatitis, who presents to the ER due to abnormal lab results.  The patient went to his primary care provider at the [facility] for a post hospital follow up appointment.  The patient was recently admitted from 06/14/2024 through 06/19/2024 for large left pleural effusion, hepatic hydrothorax/hemothorax with acute hypoxic respiratory failure, and E. coli bacteremia.  Outpatient blood work returned today with leukocytosis 34,000.  The patient was advised to go to the ER for further evaluation.  He presented to the ER at [facility] accompanied by his wife.    CT abdomen pelvis with contrast revealed moderate left-sided pleural effusion, decreased compared to prior, resolved right pleural effusion.  Improved aeration of the left upper lobe.  Liver cirrhosis with evidence of portal hypertension.  Large gastroesophageal varices and splenomegaly.  Decreased small volume ascites compared to prior.  Interim finding of long enhancing fluid collection along the right anterior pararenal space and the right colic gutter this is suspicious for developing abscess.     IR consulted, patient underwent thoracentesis for left-sided pleural effusio   Assessment & Plan:   Principal Problem:   Intra-abdominal abscess (HCC) Active Problems:   Leukocytosis   Hyperbilirubinemia   Esophageal varices (HCC)   Portal hypertension (HCC)   Coagulopathy   Alcoholic cirrhosis of liver with ascites (HCC)   Thrombocytopenia   Pleural effusion   Hyponatremia  #1 concern for developing intra-abdominal abscess with a leukocytosis  - Patient noted to have recently  been admitted for E. coli bacteremia question as to whether he completed cefadroxil  and Flagyl  in the outpatient setting however it is noted per Dr. Rojelio that patient did complete this. - Patient sent to the ED due to leukocytosis however patient asymptomatic. - Patient denied any respiratory symptoms, no significant abdominal pain, no dysuria. - Leukocytosis initially noted to have improved slight trend up but trending back down however seems to be stabilizing. - Repeat CT abdomen and pelvis done noted cirrhosis with portal venous hypertension, including gastroesophageal right retroperitoneal varices, mild ascites slightly progressive, mild splenomegaly.  Gallbladder distention with layering sludge and small stones without definite pericholecystic inflammatory change.  Small left pleural effusion decreased from prior with associated left basilar compressive atelectasis.  Emphysema. - Concern for intra-abdominal abscess noted on prior CT scan not noted on repeat CT scan. - Continue IV Zosyn . - Due to persistent leukocytosis will consult with ID for further evaluation and recommendations.  2.  End-stage liver disease/alcoholic cirrhosis with ascites/portal hypertension/gastroesophageal varices/splenomegaly/jaundice/coagulopathy/thrombocytopenia -Patient being followed in outpatient setting by gastroenterology at [facility]. - Per patient patient being considered for possible liver transplant. -MELD Na score of 31. - Patient currently stable. - Continue Lasix , spironolactone , lactulose , Xifaxan . - Was informed by RN that [facility] transplant had called and was interested in transferring patient, called the number listed per RN [phone] extension [phone] on 07/01/2024 and 07/02/2024 with no response.  -Patient plans on going to the [facility] when he is discharged for evaluation at transplant center.  3.  Moderate left-sided pleural effusion -Status post thoracentesis 06/27/2024 with 1.3 L of bloody fluid  removed. - Cultures from pleural effusion  with no growth to date. - Body fluid consistent with exudative effusion. - Patient on empiric IV Zosyn .  4.  Hyponatremia -Likely secondary to cirrhosis. - Lasix  and spironolactone  are held on 06/30/2024 and subsequently resumed on 07/01/2024. - Sodium at 129 today. - Follow.   DVT prophylaxis: SCDs Code Status: Full Family Communication: Updated patient.  No family at bedside. Disposition: TBD  Status is: Inpatient Remains inpatient appropriate because: Severity of illness   Consultants:  None  Procedures:  CT chest abdomen and pelvis 06/25/2024, 06/30/2024 Chest x-ray 06/25/2024, 06/27/2024, 06/28/2024 Ultrasound-guided left thoracentesis yielding 1.3 L of bloody fluid per IR 06/27/2024  Antimicrobials:  Anti-infectives (From admission, onward)    Start     Dose/Rate Route Frequency Ordered Stop   06/26/24 1000  vancomycin  (VANCOCIN ) IVPB 1000 mg/200 mL premix  Status:  Discontinued        1,000 mg 200 mL/hr over 60 Minutes Intravenous Every 12 hours 06/25/24 2327 06/26/24 0026   06/26/24 1000  linezolid  (ZYVOX ) IVPB 600 mg  Status:  Discontinued        600 mg 300 mL/hr over 60 Minutes Intravenous Every 12 hours 06/26/24 0026 06/26/24 1202   06/26/24 0800  rifaximin  (XIFAXAN ) tablet 550 mg        550 mg Oral 2 times daily 06/25/24 2330     06/26/24 0400  piperacillin -tazobactam (ZOSYN ) IVPB 3.375 g        3.375 g 12.5 mL/hr over 240 Minutes Intravenous Every 8 hours 06/26/24 0023     06/25/24 2245  vancomycin  (VANCOREADY) IVPB 1250 mg/250 mL        1,250 mg 166.7 mL/hr over 90 Minutes Intravenous  Once 06/25/24 2239 06/26/24 0031   06/25/24 2100  piperacillin -tazobactam (ZOSYN ) IVPB 3.375 g        3.375 g 100 mL/hr over 30 Minutes Intravenous  Once 06/25/24 2051 06/25/24 2130         Subjective: Patient sitting up in bed.  Denies any chest pain or shortness of breath.  No abdominal pain.  Tolerating current diet.  Overall  feels well.  Patient states being assessed for liver transplant.  Patient states on discharge planning to drive up to the [facility] [place] to the transplant center.  Objective: Vitals:   07/01/24 0413 07/01/24 1157 07/01/24 1939 07/02/24 0440  BP: 116/61 118/64 123/66 115/62  Pulse: 90 99 98 95  Resp: 16  16 18   Temp: 98.9 F (37.2 C) 98.4 F (36.9 C) 98.3 F (36.8 C) 98 F (36.7 C)  TempSrc: Oral Oral Oral Oral  SpO2:  98% 100% 100%  Weight:      Height:       No intake or output data in the 24 hours ending 07/02/24 1308  Filed Weights   06/25/24 1924  Weight: 62.6 kg    Examination:  General exam: Appears calm and comfortable.  Jaundiced. Respiratory system: CTAB.  No wheezes, no crackles, no rhonchi.  Fair air movement.  Speaking in full sentences.  Cardiovascular system: Regular rate rhythm with no murmurs rubs or gallops.  No JVD.  No lower extremity edema.  Gastrointestinal system: Abdomen is soft, mildly distended, nontender, positive bowel sounds, no rebound, no guarding.  Central nervous system: Alert and oriented. No focal neurological deficits. Extremities: Symmetric 5 x 5 power. Skin: No rashes, lesions or ulcers Psychiatry: Judgement and insight appear normal. Mood & affect appropriate.     Data Reviewed: I have personally reviewed following labs and imaging studies  CBC: Recent Labs  Lab 06/25/24 1959 06/26/24 0430 06/28/24 0553 06/29/24 0503 06/30/24 0551 07/01/24 0548 07/02/24 0535  WBC 28.5*   < > 20.6* 23.2* 22.9* 21.0* 21.6*  NEUTROABS 21.9*  --   --   --   --   --  16.5*  HGB 9.4*   < > 8.5* 8.3* 8.0* 7.4* 7.8*  HCT 26.9*   < > 23.7* 24.4* 22.3* 21.2* 23.2*  MCV 101.5*   < > 99.6 103.4* 100.5* 101.9* 105.0*  PLT 140*   < > 90* 82* 79* 78* 98*   < > = values in this interval not displayed.    Basic Metabolic Panel: Recent Labs  Lab 06/26/24 0430 06/27/24 0531 06/28/24 0553 06/29/24 0503 06/30/24 0551 07/01/24 0548  07/02/24 0535  NA 133*   < > 128* 126* 125* 130* 129*  K 4.3   < > 4.8 4.9 4.7 4.8 4.2  CL 103   < > 95* 93* 94* 99 96*  CO2 21*   < > 25 23 24 23 24   GLUCOSE 125*   < > 146* 132* 163* 110* 150*  BUN 11   < > 13 17 13 9 10   CREATININE 0.55*   < > 0.72 0.78 0.71 0.80 0.69  CALCIUM 7.8*   < > 8.5* 8.4* 8.7* 8.7* 8.8*  MG 1.7  --   --   --   --   --   --   PHOS 2.4*  --   --   --   --   --   --    < > = values in this interval not displayed.    GFR: Estimated Creatinine Clearance: 107.6 mL/min (by C-G formula based on SCr of 0.69 mg/dL).  Liver Function Tests: Recent Labs  Lab 06/28/24 0553 06/29/24 0503 06/30/24 0551 07/01/24 0548 07/02/24 0535  AST 58* 74* 83* 83* 89*  ALT 5 13 17 21 22   ALKPHOS 133* 144* 144* 165* 190*  BILITOT 19.3* 20.3* 20.6* 17.9* 18.3*  PROT 6.3* 6.3* 6.3* 5.9* 6.4*  ALBUMIN  3.0* 3.0* 3.0* 2.8* 3.0*    CBG: No results for input(s): GLUCAP in the last 168 hours.   Recent Results (from the past 240 hours)  Blood culture (routine x 2)     Status: None   Collection Time: 06/25/24  8:42 PM   Specimen: BLOOD  Result Value Ref Range Status   Specimen Description   Final    BLOOD LEFT ANTECUBITAL Performed at [facility], [address] [place], [place] [address]    Special Requests   Final    BOTTLES DRAWN AEROBIC AND ANAEROBIC Blood Culture results may not be optimal due to an inadequate volume of blood received in culture bottles Performed at [facility], [address] [place], [place] [address]    Culture   Final    NO GROWTH 5 DAYS Performed at [facility], [address], [place], [place] [address]    Report Status 07/01/2024 FINAL  Final  Blood culture (routine x 2)     Status: None   Collection Time: 06/25/24  8:55 PM   Specimen: BLOOD  Result Value Ref Range Status   Specimen Description   Final    BLOOD RIGHT ANTECUBITAL Performed at [facility], [address] [place], [place] [address]    Special Requests   Final    BOTTLES DRAWN AEROBIC AND ANAEROBIC Blood Culture results may  not be optimal due to an inadequate volume of blood received in culture bottles Performed at [facility], [address], [place], [place] [address]    Culture   Final    NO GROWTH 5 DAYS Performed at [facility], [address], [place], [place] [address]    Report Status 07/01/2024 FINAL  Final  Resp panel by RT-PCR (RSV, Flu A&B, Covid) Anterior Nasal Swab     Status: None   Collection Time: 06/25/24  9:06 PM   Specimen: Anterior Nasal Swab  Result Value Ref Range Status   SARS Coronavirus 2 by RT PCR NEGATIVE NEGATIVE Final    Comment: (NOTE) SARS-CoV-2 target nucleic acids are NOT DETECTED.  The SARS-CoV-2 RNA is generally detectable in upper respiratory specimens during the acute phase of infection. The lowest concentration of SARS-CoV-2 viral copies this assay can detect is 138 copies/mL. A negative result does not preclude SARS-Cov-2 infection and should not be used as the sole basis for treatment or other patient management decisions. A negative result may occur with  improper specimen collection/handling, submission of specimen other than nasopharyngeal swab, presence of viral mutation(s) within the areas targeted by this assay, and inadequate number of viral copies(<138 copies/mL). A negative result must be combined with clinical observations, patient history, and epidemiological information. The expected result is Negative.  Fact Sheet for Patients:  bloggercourse.com  Fact Sheet for Healthcare Providers:  seriousbroker.it  This test is no t yet approved or cleared by the [place]  FDA and  has been authorized for detection and/or diagnosis of SARS-CoV-2 by FDA under an Emergency Use Authorization (EUA). This EUA will remain  in effect (meaning this test can  be used) for the duration of the COVID-19 declaration under Section 564(b)(1) of the Act, 21 U.S.C.section 360bbb-3(b)(1), unless the authorization is terminated  or revoked sooner.       Influenza A by PCR NEGATIVE NEGATIVE Final   Influenza B by PCR NEGATIVE NEGATIVE Final    Comment: (NOTE) The Xpert Xpress SARS-CoV-2/FLU/RSV plus assay is intended as an aid in the diagnosis of influenza from Nasopharyngeal swab specimens and should not be used as a sole basis for treatment. Nasal washings and aspirates are unacceptable for Xpert Xpress SARS-CoV-2/FLU/RSV testing.  Fact Sheet for Patients: bloggercourse.com  Fact Sheet for Healthcare Providers: seriousbroker.it  This test is not yet approved or cleared by the [place]  FDA and has been authorized for detection and/or diagnosis of SARS-CoV-2 by FDA under an Emergency Use Authorization (EUA). This EUA will remain in effect (meaning this test can be used) for the duration of the COVID-19 declaration under Section 564(b)(1) of the Act, 21 U.S.C. section 360bbb-3(b)(1), unless the authorization is terminated or revoked.     Resp Syncytial Virus by PCR NEGATIVE NEGATIVE Final    Comment: (NOTE) Fact Sheet for Patients: bloggercourse.com  Fact Sheet for Healthcare Providers: seriousbroker.it  This test is not yet approved or cleared by the [place]  FDA and has been authorized for detection and/or diagnosis of SARS-CoV-2 by FDA under an Emergency Use Authorization (EUA). This EUA will remain in effect (meaning this test can be used) for the duration of the COVID-19 declaration under Section 564(b)(1) of the Act, 21 U.S.C. section 360bbb-3(b)(1), unless the authorization is terminated or revoked.  Performed at [facility], [address], [place], [place] [address]   MRSA Next Gen by PCR, Nasal      Status: None   Collection  Time: 06/26/24  4:30 AM   Specimen: Nasal Mucosa; Nasal Swab  Result Value Ref Range Status   MRSA by PCR Next Gen NOT DETECTED NOT DETECTED Final    Comment: (NOTE) The GeneXpert MRSA Assay (FDA approved for NASAL specimens only), is one component of a comprehensive MRSA colonization surveillance program. It is not intended to diagnose MRSA infection nor to guide or monitor treatment for MRSA infections. Test performance is not FDA approved in patients less than 3 years old. Performed at [facility], [address], [place], [place] [address]   Body fluid culture w Gram Stain     Status: None   Collection Time: 06/27/24 12:00 PM   Specimen: PATH Cytology Pleural fluid  Result Value Ref Range Status   Specimen Description   Final    PLEURAL Performed at [facility], [address] [place], [place] [address]    Special Requests   Final    NONE Performed at [facility], [address], [place], [place] [address]    Gram Stain NO WBC SEEN NO ORGANISMS SEEN   Final   Culture   Final    NO GROWTH 3 DAYS Performed at [facility], [address], [place], [place] [address]    Report Status 07/01/2024 FINAL  Final         Radiology Studies: CT CHEST ABDOMEN PELVIS W CONTRAST Result Date: 06/30/2024 EXAM: CT CHEST WITH CONTRAST 06/30/2024 02:36:33 PM TECHNIQUE: CT of the chest was performed with the administration of 100 mL of iohexol  (OMNIPAQUE ) 300 MG/ML solution. Multiplanar reformatted images are provided for review. Automated exposure control, iterative reconstruction, and/or weight based adjustment of the mA/kV was utilized to reduce the radiation dose to as low as reasonably achievable. COMPARISON: 06/25/2024. CLINICAL HISTORY: Persistent elevation of white blood cell count. Chronic liver disease, jaundice, leukocytosis. FINDINGS: MEDIASTINUM: Heart and pericardium are unremarkable.  Global cardiac size within normal limits. No pericardial effusion. Central pulmonary arteries are of normal caliber. Mild atherosclerotic calcification within the thoracic aorta. No aortic aneurysm. The central airways are clear. LYMPH NODES: No mediastinal, hilar or axillary lymphadenopathy. LUNGS AND PLEURA: Mild emphysema. Small left pleural effusion, decreased in size from prior examination. Associated left basilar compressive atelectasis. No focal consolidation or pulmonary edema. Low-dose CT lung cancer screening is recommended for patients who are 61-83 years of age with a 20+ pack-year history of smoking and who are currently smoking or quit <=15 years ago. SOFT TISSUES/BONES: Osseous structures are age appropriate. No acute bone abnormality. No lytic or blastic bone lesion. UPPER ABDOMEN: Changes of cirrhosis again identified with hypertrophy of the hepatic artery. The gallbladder is distended and contains layering sludge and small stones. No definite superimposed pericholecystic inflammatory change. Mild ascites is present, slightly progressive since prior examination. Numerous gastroesophageal and right retroperitoneal varices are again identified in keeping with changes of portal venous hypertension. Mild splenomegaly with a spleen measuring 15.5 cm in greatest dimension. No intrasplenic lesion. The appendix is not clearly identified, however, there are no secondary signs of appendicitis within the right lower quadrant. The stomach, small bowel, and large bowel are otherwise unremarkable. Mild aortoiliac atherosclerotic calcification. IMPRESSION: 1. Cirrhosis with portal venous hypertension, including gastroesophageal and right retroperitoneal varices, mild ascites (slightly progressive), and mild splenomegaly (15.5 cm). 2. Gallbladder distention with layering sludge and small stones, without definite pericholecystic inflammatory change. If there is clinical concern for acute cholecystitis, however,  dedicated right upper quadrant sonography  may be helpful for further evaluation. 3. Small left pleural effusion, decreased from prior, with associated left basilar compressive atelectasis. 4. Emphysema ([id number]). Electronically signed by: Dorethia Molt MD 06/30/2024 11:05 PM EST RP Workstation: [id number]        Scheduled Meds:  folic acid   1 mg Oral Daily   furosemide   40 mg Oral Daily   lactulose   20 g Oral BID   multivitamin with minerals  1 tablet Oral Daily   pantoprazole   40 mg Oral QHS   potassium chloride  SA  20 mEq Oral Daily   rifaximin   550 mg Oral BID   spironolactone   100 mg Oral Daily   thiamine   100 mg Oral Daily   Continuous Infusions:  piperacillin -tazobactam 3.375 g (07/02/24 0446)     LOS: 7 days    Time spent: 35 minutes    Toribio Hummer, MD [facility]   To contact the attending provider between 7A-7P or the covering provider during after hours 7P-7A, please log into the web site [email or web address] and access using [place] password for that web site. If you do not have the password, please call the hospital operator.  07/02/2024, 1:08 PM    "

## 2024-07-03 ENCOUNTER — Other Ambulatory Visit (HOSPITAL_COMMUNITY): Payer: Self-pay

## 2024-07-03 LAB — CBC WITH DIFFERENTIAL/PLATELET
Abs Immature Granulocytes: 1 10*3/uL — ABNORMAL HIGH (ref 0.00–0.07)
Basophils Absolute: 0 10*3/uL (ref 0.0–0.1)
Basophils Relative: 0 %
Eosinophils Absolute: 0.3 10*3/uL (ref 0.0–0.5)
Eosinophils Relative: 1 %
HCT: 22 % — ABNORMAL LOW (ref 39.0–52.0)
Hemoglobin: 7.6 g/dL — ABNORMAL LOW (ref 13.0–17.0)
Immature Granulocytes: 4 %
Lymphocytes Relative: 10 %
Lymphs Abs: 2.6 10*3/uL (ref 0.7–4.0)
MCH: 36.2 pg — ABNORMAL HIGH (ref 26.0–34.0)
MCHC: 34.5 g/dL (ref 30.0–36.0)
MCV: 104.8 fL — ABNORMAL HIGH (ref 80.0–100.0)
Monocytes Absolute: 2 10*3/uL — ABNORMAL HIGH (ref 0.1–1.0)
Monocytes Relative: 8 %
Neutro Abs: 19.7 10*3/uL — ABNORMAL HIGH (ref 1.7–7.7)
Neutrophils Relative %: 77 %
Platelets: 91 10*3/uL — ABNORMAL LOW (ref 150–400)
RBC: 2.1 MIL/uL — ABNORMAL LOW (ref 4.22–5.81)
RDW: 20.8 % — ABNORMAL HIGH (ref 11.5–15.5)
WBC: 25.6 10*3/uL — ABNORMAL HIGH (ref 4.0–10.5)
nRBC: 0.1 % (ref 0.0–0.2)

## 2024-07-03 LAB — BASIC METABOLIC PANEL WITH GFR
Anion gap: 10 (ref 5–15)
BUN: 9 mg/dL (ref 6–20)
CO2: 22 mmol/L (ref 22–32)
Calcium: 8.4 mg/dL — ABNORMAL LOW (ref 8.9–10.3)
Chloride: 97 mmol/L — ABNORMAL LOW (ref 98–111)
Creatinine, Ser: 0.59 mg/dL — ABNORMAL LOW (ref 0.61–1.24)
GFR, Estimated: >60 mL/min
Glucose, Bld: 125 mg/dL — ABNORMAL HIGH (ref 70–99)
Potassium: 4.1 mmol/L (ref 3.5–5.1)
Sodium: 129 mmol/L — ABNORMAL LOW (ref 135–145)

## 2024-07-03 MED ORDER — AMOXICILLIN-POT CLAVULANATE 875-125 MG PO TABS
1.0000 | ORAL_TABLET | Freq: Two times a day (BID) | ORAL | 0 refills | Status: AC
  Filled 2024-07-03: qty 14, 7d supply, fill #0

## 2024-07-03 NOTE — Plan of Care (Signed)
   Problem: Health Behavior/Discharge Planning: Goal: Ability to manage health-related needs will improve Outcome: Progressing   Problem: Clinical Measurements: Goal: Ability to maintain clinical measurements within normal limits will improve Outcome: Progressing

## 2024-07-06 LAB — LD, BODY FLUID (OTHER): Source of Sample: 100156
